# Patient Record
Sex: Female | Born: 1950 | Race: White | Hispanic: No | Smoking: Never smoker
Health system: Southern US, Community
[De-identification: ages and names within clinical notes are randomized; demographics above are authoritative.]

## PROBLEM LIST (undated history)

## (undated) DIAGNOSIS — H269 Unspecified cataract: Secondary | ICD-10-CM

## (undated) DIAGNOSIS — M1990 Unspecified osteoarthritis, unspecified site: Secondary | ICD-10-CM

## (undated) DIAGNOSIS — T7840XA Allergy, unspecified, initial encounter: Secondary | ICD-10-CM

## (undated) DIAGNOSIS — S83249A Other tear of medial meniscus, current injury, unspecified knee, initial encounter: Secondary | ICD-10-CM

## (undated) DIAGNOSIS — E039 Hypothyroidism, unspecified: Secondary | ICD-10-CM

## (undated) DIAGNOSIS — K589 Irritable bowel syndrome without diarrhea: Secondary | ICD-10-CM

## (undated) DIAGNOSIS — K635 Polyp of colon: Secondary | ICD-10-CM

## (undated) DIAGNOSIS — C50919 Malignant neoplasm of unspecified site of unspecified female breast: Secondary | ICD-10-CM

## (undated) DIAGNOSIS — G4730 Sleep apnea, unspecified: Secondary | ICD-10-CM

## (undated) DIAGNOSIS — E785 Hyperlipidemia, unspecified: Secondary | ICD-10-CM

## (undated) DIAGNOSIS — N200 Calculus of kidney: Secondary | ICD-10-CM

## (undated) DIAGNOSIS — N189 Chronic kidney disease, unspecified: Secondary | ICD-10-CM

## (undated) DIAGNOSIS — Z87442 Personal history of urinary calculi: Secondary | ICD-10-CM

## (undated) HISTORY — DX: Polyp of colon: K63.5

## (undated) HISTORY — DX: Other tear of medial meniscus, current injury, unspecified knee, initial encounter: S83.249A

## (undated) HISTORY — DX: Personal history of urinary calculi: Z87.442

## (undated) HISTORY — DX: Calculus of kidney: N20.0

## (undated) HISTORY — PX: TONSILLECTOMY: SUR1361

## (undated) HISTORY — DX: Unspecified cataract: H26.9

## (undated) HISTORY — PX: COLONOSCOPY: SHX174

## (undated) HISTORY — DX: Allergy, unspecified, initial encounter: T78.40XA

## (undated) HISTORY — DX: Irritable bowel syndrome, unspecified: K58.9

## (undated) HISTORY — DX: Irritable bowel syndrome without diarrhea: K58.9

## (undated) HISTORY — DX: Malignant neoplasm of unspecified site of unspecified female breast: C50.919

## (undated) HISTORY — DX: Unspecified osteoarthritis, unspecified site: M19.90

## (undated) HISTORY — DX: Chronic kidney disease, unspecified: N18.9

## (undated) HISTORY — DX: Hyperlipidemia, unspecified: E78.5

---

## 1975-04-23 HISTORY — PX: BREAST BIOPSY: SHX20

## 1975-04-23 HISTORY — PX: CYSTOSCOPY: SUR368

## 1988-04-22 HISTORY — PX: MYOMECTOMY: SHX85

## 1999-05-04 ENCOUNTER — Other Ambulatory Visit: Admission: RE | Admit: 1999-05-04 | Discharge: 1999-05-04 | Payer: Self-pay | Admitting: Obstetrics and Gynecology

## 2002-06-28 ENCOUNTER — Other Ambulatory Visit: Admission: RE | Admit: 2002-06-28 | Discharge: 2002-06-28 | Payer: Self-pay | Admitting: Obstetrics and Gynecology

## 2003-07-28 ENCOUNTER — Other Ambulatory Visit: Admission: RE | Admit: 2003-07-28 | Discharge: 2003-07-28 | Payer: Self-pay | Admitting: Obstetrics and Gynecology

## 2004-04-22 DIAGNOSIS — C50919 Malignant neoplasm of unspecified site of unspecified female breast: Secondary | ICD-10-CM

## 2004-04-22 HISTORY — DX: Malignant neoplasm of unspecified site of unspecified female breast: C50.919

## 2004-04-22 HISTORY — PX: BREAST LUMPECTOMY: SHX2

## 2004-07-11 ENCOUNTER — Ambulatory Visit: Payer: Self-pay | Admitting: Internal Medicine

## 2004-07-19 ENCOUNTER — Ambulatory Visit: Payer: Self-pay | Admitting: Internal Medicine

## 2004-09-10 ENCOUNTER — Other Ambulatory Visit: Admission: RE | Admit: 2004-09-10 | Discharge: 2004-09-10 | Payer: Self-pay | Admitting: Obstetrics and Gynecology

## 2004-10-13 ENCOUNTER — Encounter: Admission: RE | Admit: 2004-10-13 | Discharge: 2004-10-13 | Payer: Self-pay | Admitting: Surgery

## 2004-10-15 ENCOUNTER — Ambulatory Visit: Payer: Self-pay | Admitting: Internal Medicine

## 2004-10-22 ENCOUNTER — Ambulatory Visit: Payer: Self-pay | Admitting: Internal Medicine

## 2004-11-09 ENCOUNTER — Encounter: Admission: RE | Admit: 2004-11-09 | Discharge: 2004-11-09 | Payer: Self-pay | Admitting: Surgery

## 2004-11-15 ENCOUNTER — Encounter (INDEPENDENT_AMBULATORY_CARE_PROVIDER_SITE_OTHER): Payer: Self-pay | Admitting: *Deleted

## 2004-11-15 ENCOUNTER — Ambulatory Visit (HOSPITAL_COMMUNITY): Admission: RE | Admit: 2004-11-15 | Discharge: 2004-11-15 | Payer: Self-pay | Admitting: Surgery

## 2004-11-15 ENCOUNTER — Ambulatory Visit (HOSPITAL_BASED_OUTPATIENT_CLINIC_OR_DEPARTMENT_OTHER): Admission: RE | Admit: 2004-11-15 | Discharge: 2004-11-15 | Payer: Self-pay | Admitting: Surgery

## 2004-11-16 ENCOUNTER — Ambulatory Visit: Payer: Self-pay | Admitting: Oncology

## 2004-11-20 ENCOUNTER — Ambulatory Visit: Admission: RE | Admit: 2004-11-20 | Discharge: 2005-02-14 | Payer: Self-pay | Admitting: Radiation Oncology

## 2005-02-08 ENCOUNTER — Ambulatory Visit: Payer: Self-pay | Admitting: Oncology

## 2005-03-08 ENCOUNTER — Ambulatory Visit: Payer: Self-pay | Admitting: Internal Medicine

## 2005-05-07 ENCOUNTER — Ambulatory Visit: Payer: Self-pay | Admitting: Oncology

## 2005-05-09 ENCOUNTER — Ambulatory Visit: Payer: Self-pay | Admitting: Gastroenterology

## 2005-08-05 ENCOUNTER — Ambulatory Visit: Payer: Self-pay | Admitting: Oncology

## 2005-08-13 ENCOUNTER — Ambulatory Visit: Payer: Self-pay | Admitting: Internal Medicine

## 2005-08-20 ENCOUNTER — Ambulatory Visit: Payer: Self-pay | Admitting: Internal Medicine

## 2006-02-21 ENCOUNTER — Ambulatory Visit: Payer: Self-pay | Admitting: Internal Medicine

## 2006-02-21 LAB — CONVERTED CEMR LAB
Alkaline Phosphatase: 43 units/L (ref 39–117)
Chol/HDL Ratio, serum: 3.3
Cholesterol: 180 mg/dL (ref 0–200)
VLDL: 22 mg/dL (ref 0–40)

## 2006-04-22 HISTORY — PX: CATARACT EXTRACTION: SUR2

## 2006-07-28 ENCOUNTER — Ambulatory Visit: Payer: Self-pay | Admitting: Oncology

## 2006-07-30 LAB — COMPREHENSIVE METABOLIC PANEL
ALT: 21 U/L (ref 0–35)
AST: 20 U/L (ref 0–37)
Alkaline Phosphatase: 48 U/L (ref 39–117)
Chloride: 103 mEq/L (ref 96–112)
Creatinine, Ser: 0.75 mg/dL (ref 0.40–1.20)
Total Bilirubin: 0.4 mg/dL (ref 0.3–1.2)

## 2006-07-30 LAB — CANCER ANTIGEN 27.29: CA 27.29: 16 U/mL (ref 0–39)

## 2006-09-03 ENCOUNTER — Ambulatory Visit: Payer: Self-pay | Admitting: Internal Medicine

## 2006-09-03 LAB — CONVERTED CEMR LAB
BUN: 15 mg/dL (ref 6–23)
Basophils Absolute: 0 10*3/uL (ref 0.0–0.1)
Bilirubin, Direct: 0.1 mg/dL (ref 0.0–0.3)
CO2: 30 meq/L (ref 19–32)
Calcium: 9.1 mg/dL (ref 8.4–10.5)
Chloride: 110 meq/L (ref 96–112)
Eosinophils Absolute: 0.1 10*3/uL (ref 0.0–0.6)
HCT: 40.9 % (ref 36.0–46.0)
LDL Cholesterol: 105 mg/dL — ABNORMAL HIGH (ref 0–99)
Lymphocytes Relative: 21.4 % (ref 12.0–46.0)
MCHC: 34.2 g/dL (ref 30.0–36.0)
MCV: 88.4 fL (ref 78.0–100.0)
Neutro Abs: 3.8 10*3/uL (ref 1.4–7.7)
Neutrophils Relative %: 67.1 % (ref 43.0–77.0)
Potassium: 4.2 meq/L (ref 3.5–5.1)
RDW: 12.8 % (ref 11.5–14.6)
TSH: 3.89 microintl units/mL (ref 0.35–5.50)
Total Bilirubin: 0.6 mg/dL (ref 0.3–1.2)
Total Protein: 6.4 g/dL (ref 6.0–8.3)
VLDL: 27 mg/dL (ref 0–40)
WBC: 5.6 10*3/uL (ref 4.5–10.5)

## 2006-09-22 ENCOUNTER — Ambulatory Visit: Payer: Self-pay | Admitting: Internal Medicine

## 2006-09-22 LAB — HM MAMMOGRAPHY

## 2006-11-26 DIAGNOSIS — Z87442 Personal history of urinary calculi: Secondary | ICD-10-CM

## 2006-11-26 HISTORY — DX: Personal history of urinary calculi: Z87.442

## 2007-04-21 ENCOUNTER — Ambulatory Visit: Payer: Self-pay | Admitting: Internal Medicine

## 2007-04-21 LAB — CONVERTED CEMR LAB
Bilirubin Urine: NEGATIVE
Glucose, Urine, Semiquant: NEGATIVE
Specific Gravity, Urine: 1.01
Urobilinogen, UA: 0.2
pH: 7

## 2007-04-25 LAB — HM COLONOSCOPY

## 2007-05-19 ENCOUNTER — Ambulatory Visit: Payer: Self-pay | Admitting: Internal Medicine

## 2007-05-19 DIAGNOSIS — K589 Irritable bowel syndrome without diarrhea: Secondary | ICD-10-CM

## 2007-05-19 DIAGNOSIS — Z853 Personal history of malignant neoplasm of breast: Secondary | ICD-10-CM

## 2007-05-19 DIAGNOSIS — E785 Hyperlipidemia, unspecified: Secondary | ICD-10-CM

## 2007-05-19 HISTORY — DX: Irritable bowel syndrome, unspecified: K58.9

## 2007-07-10 ENCOUNTER — Ambulatory Visit: Payer: Self-pay | Admitting: Internal Medicine

## 2007-07-10 DIAGNOSIS — E039 Hypothyroidism, unspecified: Secondary | ICD-10-CM

## 2007-07-10 LAB — CONVERTED CEMR LAB
ALT: 19 units/L (ref 0–35)
AST: 21 units/L (ref 0–37)
Albumin: 3.5 g/dL (ref 3.5–5.2)
Alkaline Phosphatase: 46 units/L (ref 39–117)
Bilirubin, Direct: 0.2 mg/dL (ref 0.0–0.3)
Cholesterol: 179 mg/dL (ref 0–200)
HDL: 44.8 mg/dL (ref >39.0)
LDL Cholesterol: 111 mg/dL — ABNORMAL HIGH (ref 0–99)
TSH: 2.38 microintl units/mL (ref 0.35–5.50)
Total Bilirubin: 0.4 mg/dL (ref 0.3–1.2)
Total CHOL/HDL Ratio: 4
Total Protein: 5.8 g/dL — ABNORMAL LOW (ref 6.0–8.3)
Triglycerides: 115 mg/dL (ref 0–149)
VLDL: 23 mg/dL (ref 0–40)

## 2007-07-17 ENCOUNTER — Ambulatory Visit: Payer: Self-pay | Admitting: Internal Medicine

## 2007-07-17 DIAGNOSIS — M899 Disorder of bone, unspecified: Secondary | ICD-10-CM

## 2007-07-17 DIAGNOSIS — M949 Disorder of cartilage, unspecified: Secondary | ICD-10-CM

## 2007-07-28 ENCOUNTER — Encounter: Payer: Self-pay | Admitting: Internal Medicine

## 2007-07-28 ENCOUNTER — Ambulatory Visit: Payer: Self-pay | Admitting: Oncology

## 2007-07-28 ENCOUNTER — Ambulatory Visit: Payer: Self-pay | Admitting: Internal Medicine

## 2007-07-30 LAB — COMPREHENSIVE METABOLIC PANEL
ALT: 20 U/L (ref 0–35)
AST: 21 U/L (ref 0–37)
Albumin: 4.4 g/dL (ref 3.5–5.2)
Alkaline Phosphatase: 58 U/L (ref 39–117)
BUN: 15 mg/dL (ref 6–23)
CO2: 24 mEq/L (ref 19–32)
Calcium: 9.8 mg/dL (ref 8.4–10.5)
Chloride: 105 mEq/L (ref 96–112)
Creatinine, Ser: 0.83 mg/dL (ref 0.40–1.20)
Glucose, Bld: 122 mg/dL — ABNORMAL HIGH (ref 70–99)
Potassium: 4.2 mEq/L (ref 3.5–5.3)
Sodium: 141 mEq/L (ref 135–145)
Total Bilirubin: 0.2 mg/dL — ABNORMAL LOW (ref 0.3–1.2)
Total Protein: 6.8 g/dL (ref 6.0–8.3)

## 2007-07-30 LAB — CBC WITH DIFFERENTIAL/PLATELET
BASO%: 0.3 % (ref 0.0–2.0)
Basophils Absolute: 0 10*3/uL (ref 0.0–0.1)
HCT: 39.1 % (ref 34.8–46.6)
HGB: 13.4 g/dL (ref 11.6–15.9)
MCHC: 34.3 g/dL (ref 32.0–36.0)
MONO#: 0.5 10*3/uL (ref 0.1–0.9)
NEUT%: 61.3 % (ref 39.6–76.8)
WBC: 6 10*3/uL (ref 3.9–10.0)
lymph#: 1.7 10*3/uL (ref 0.9–3.3)

## 2007-07-30 LAB — CANCER ANTIGEN 27.29: CA 27.29: 20 U/mL (ref 0–39)

## 2007-08-06 ENCOUNTER — Encounter: Payer: Self-pay | Admitting: Internal Medicine

## 2007-09-11 ENCOUNTER — Ambulatory Visit: Payer: Self-pay | Admitting: Internal Medicine

## 2007-10-05 ENCOUNTER — Telehealth: Payer: Self-pay | Admitting: Internal Medicine

## 2007-10-09 ENCOUNTER — Encounter: Payer: Self-pay | Admitting: Internal Medicine

## 2007-11-13 ENCOUNTER — Ambulatory Visit: Payer: Self-pay | Admitting: Internal Medicine

## 2007-11-13 LAB — CONVERTED CEMR LAB
LDL Cholesterol: 102 mg/dL — ABNORMAL HIGH (ref 0–99)
Total CHOL/HDL Ratio: 3.7
Vit D, 1,25-Dihydroxy: 29 — ABNORMAL LOW (ref 30–89)

## 2007-11-20 ENCOUNTER — Ambulatory Visit: Payer: Self-pay | Admitting: Internal Medicine

## 2007-12-14 ENCOUNTER — Ambulatory Visit (HOSPITAL_COMMUNITY): Admission: RE | Admit: 2007-12-14 | Discharge: 2007-12-14 | Payer: Self-pay | Admitting: Internal Medicine

## 2007-12-14 ENCOUNTER — Encounter (INDEPENDENT_AMBULATORY_CARE_PROVIDER_SITE_OTHER): Payer: Self-pay | Admitting: Obstetrics and Gynecology

## 2008-01-18 ENCOUNTER — Ambulatory Visit: Payer: Self-pay | Admitting: Internal Medicine

## 2008-02-12 ENCOUNTER — Ambulatory Visit: Payer: Self-pay | Admitting: Internal Medicine

## 2008-02-12 LAB — CONVERTED CEMR LAB: Vit D, 1,25-Dihydroxy: 43 (ref 30–89)

## 2008-02-23 ENCOUNTER — Ambulatory Visit: Payer: Self-pay | Admitting: Internal Medicine

## 2008-06-16 ENCOUNTER — Ambulatory Visit: Payer: Self-pay | Admitting: Internal Medicine

## 2008-06-16 LAB — CONVERTED CEMR LAB
Free T4: 0.7 ng/dL (ref 0.6–1.6)
Vit D, 25-Hydroxy: 38 ng/mL (ref 30–89)

## 2008-06-23 ENCOUNTER — Ambulatory Visit: Payer: Self-pay | Admitting: Internal Medicine

## 2008-06-23 LAB — CONVERTED CEMR LAB
Cholesterol, target level: 200 mg/dL
HDL goal, serum: 40 mg/dL

## 2008-08-05 ENCOUNTER — Ambulatory Visit: Payer: Self-pay | Admitting: Oncology

## 2008-08-09 LAB — CBC WITH DIFFERENTIAL/PLATELET
BASO%: 0.3 % (ref 0.0–2.0)
Eosinophils Absolute: 0.1 10*3/uL (ref 0.0–0.5)
LYMPH%: 25.1 % (ref 14.0–49.7)
MCHC: 33.6 g/dL (ref 31.5–36.0)
MONO#: 0.5 10*3/uL (ref 0.1–0.9)
NEUT#: 4 10*3/uL (ref 1.5–6.5)
RBC: 4.53 10*6/uL (ref 3.70–5.45)
RDW: 14.3 % (ref 11.2–14.5)
WBC: 6.3 10*3/uL (ref 3.9–10.3)
lymph#: 1.6 10*3/uL (ref 0.9–3.3)

## 2008-08-10 LAB — COMPREHENSIVE METABOLIC PANEL
ALT: 16 U/L (ref 0–35)
Albumin: 4.1 g/dL (ref 3.5–5.2)
CO2: 24 mEq/L (ref 19–32)
Glucose, Bld: 96 mg/dL (ref 70–99)
Potassium: 4 mEq/L (ref 3.5–5.3)
Sodium: 142 mEq/L (ref 135–145)
Total Bilirubin: 0.2 mg/dL — ABNORMAL LOW (ref 0.3–1.2)
Total Protein: 6.3 g/dL (ref 6.0–8.3)

## 2008-08-10 LAB — VITAMIN D 25 HYDROXY (VIT D DEFICIENCY, FRACTURES): Vit D, 25-Hydroxy: 43 ng/mL (ref 30–89)

## 2008-08-15 ENCOUNTER — Encounter: Payer: Self-pay | Admitting: Internal Medicine

## 2008-09-22 ENCOUNTER — Ambulatory Visit: Payer: Self-pay | Admitting: Internal Medicine

## 2008-09-22 LAB — CONVERTED CEMR LAB
ALT: 22 units/L (ref 0–35)
AST: 22 units/L (ref 0–37)
Albumin: 3.6 g/dL (ref 3.5–5.2)
Alkaline Phosphatase: 45 units/L (ref 39–117)
Cholesterol: 178 mg/dL (ref 0–200)
HDL: 53.1 mg/dL (ref >39.00)
Total Protein: 6.4 g/dL (ref 6.0–8.3)
VLDL: 27.8 mg/dL (ref 0.0–40.0)

## 2008-09-29 ENCOUNTER — Ambulatory Visit: Payer: Self-pay | Admitting: Internal Medicine

## 2008-10-12 ENCOUNTER — Encounter: Payer: Self-pay | Admitting: Internal Medicine

## 2008-10-23 ENCOUNTER — Telehealth: Payer: Self-pay | Admitting: Family Medicine

## 2008-10-23 ENCOUNTER — Emergency Department (HOSPITAL_COMMUNITY): Admission: EM | Admit: 2008-10-23 | Discharge: 2008-10-23 | Payer: Self-pay | Admitting: Emergency Medicine

## 2009-01-04 ENCOUNTER — Ambulatory Visit: Payer: Self-pay | Admitting: Internal Medicine

## 2009-01-04 LAB — CONVERTED CEMR LAB: Vit D, 25-Hydroxy: 40 ng/mL (ref 30–89)

## 2009-01-11 ENCOUNTER — Ambulatory Visit: Payer: Self-pay | Admitting: Internal Medicine

## 2009-01-11 ENCOUNTER — Telehealth (INDEPENDENT_AMBULATORY_CARE_PROVIDER_SITE_OTHER): Payer: Self-pay | Admitting: *Deleted

## 2009-01-11 DIAGNOSIS — G4733 Obstructive sleep apnea (adult) (pediatric): Secondary | ICD-10-CM

## 2009-01-16 LAB — CONVERTED CEMR LAB
CRP, High Sensitivity: 10.2 — ABNORMAL HIGH (ref 0.00–5.00)
Cortisol, Plasma: 7 ug/dL

## 2009-01-17 ENCOUNTER — Ambulatory Visit: Payer: Self-pay | Admitting: Internal Medicine

## 2009-01-26 ENCOUNTER — Encounter (INDEPENDENT_AMBULATORY_CARE_PROVIDER_SITE_OTHER): Payer: Self-pay | Admitting: *Deleted

## 2009-02-01 ENCOUNTER — Ambulatory Visit (HOSPITAL_BASED_OUTPATIENT_CLINIC_OR_DEPARTMENT_OTHER): Admission: RE | Admit: 2009-02-01 | Discharge: 2009-02-01 | Payer: Self-pay | Admitting: Internal Medicine

## 2009-02-01 ENCOUNTER — Encounter: Payer: Self-pay | Admitting: Internal Medicine

## 2009-02-14 ENCOUNTER — Ambulatory Visit: Payer: Self-pay | Admitting: Pulmonary Disease

## 2009-02-28 ENCOUNTER — Ambulatory Visit: Payer: Self-pay | Admitting: Internal Medicine

## 2009-02-28 DIAGNOSIS — M1990 Unspecified osteoarthritis, unspecified site: Secondary | ICD-10-CM

## 2009-02-28 HISTORY — DX: Unspecified osteoarthritis, unspecified site: M19.90

## 2009-03-10 ENCOUNTER — Telehealth: Payer: Self-pay | Admitting: Internal Medicine

## 2009-03-13 ENCOUNTER — Encounter: Payer: Self-pay | Admitting: Internal Medicine

## 2009-04-22 HISTORY — PX: DILATION AND CURETTAGE OF UTERUS: SHX78

## 2009-05-30 ENCOUNTER — Ambulatory Visit: Payer: Self-pay | Admitting: Internal Medicine

## 2009-05-30 LAB — CONVERTED CEMR LAB: Vit D, 25-Hydroxy: 35 ng/mL (ref 30–89)

## 2009-06-06 ENCOUNTER — Ambulatory Visit: Payer: Self-pay | Admitting: Internal Medicine

## 2009-06-06 DIAGNOSIS — E559 Vitamin D deficiency, unspecified: Secondary | ICD-10-CM | POA: Insufficient documentation

## 2009-06-14 ENCOUNTER — Encounter: Payer: Self-pay | Admitting: Internal Medicine

## 2009-08-04 ENCOUNTER — Ambulatory Visit: Payer: Self-pay | Admitting: Oncology

## 2009-08-08 LAB — CBC WITH DIFFERENTIAL/PLATELET
Basophils Absolute: 0 10*3/uL (ref 0.0–0.1)
EOS%: 2.4 % (ref 0.0–7.0)
Eosinophils Absolute: 0.2 10*3/uL (ref 0.0–0.5)
HGB: 13.2 g/dL (ref 11.6–15.9)
MCV: 87.3 fL (ref 79.5–101.0)
MONO#: 0.6 10*3/uL (ref 0.1–0.9)
NEUT#: 4 10*3/uL (ref 1.5–6.5)
NEUT%: 59.3 % (ref 38.4–76.8)
RBC: 4.49 10*6/uL (ref 3.70–5.45)
RDW: 13.7 % (ref 11.2–14.5)
lymph#: 2 10*3/uL (ref 0.9–3.3)

## 2009-08-09 LAB — COMPREHENSIVE METABOLIC PANEL
ALT: 15 U/L (ref 0–35)
AST: 17 U/L (ref 0–37)
Alkaline Phosphatase: 57 U/L (ref 39–117)
CO2: 24 mEq/L (ref 19–32)
Creatinine, Ser: 0.68 mg/dL (ref 0.40–1.20)
Glucose, Bld: 92 mg/dL (ref 70–99)
Potassium: 4.1 mEq/L (ref 3.5–5.3)
Sodium: 139 mEq/L (ref 135–145)
Total Protein: 6.1 g/dL (ref 6.0–8.3)

## 2009-08-10 ENCOUNTER — Ambulatory Visit: Payer: Self-pay | Admitting: Internal Medicine

## 2009-08-10 LAB — CONVERTED CEMR LAB
TSH: 3.23 microintl units/mL (ref 0.35–5.50)
Vit D, 25-Hydroxy: 43 ng/mL (ref 30–89)

## 2009-08-15 ENCOUNTER — Encounter: Payer: Self-pay | Admitting: Internal Medicine

## 2009-08-17 ENCOUNTER — Ambulatory Visit: Payer: Self-pay | Admitting: Internal Medicine

## 2009-09-25 ENCOUNTER — Encounter: Payer: Self-pay | Admitting: Internal Medicine

## 2009-10-12 ENCOUNTER — Ambulatory Visit: Payer: Self-pay | Admitting: Internal Medicine

## 2009-10-12 LAB — CONVERTED CEMR LAB: TSH: 1.06 microintl units/mL (ref 0.35–5.50)

## 2009-10-17 ENCOUNTER — Encounter: Payer: Self-pay | Admitting: Internal Medicine

## 2009-10-19 ENCOUNTER — Ambulatory Visit: Payer: Self-pay | Admitting: Internal Medicine

## 2009-11-01 ENCOUNTER — Encounter: Payer: Self-pay | Admitting: Internal Medicine

## 2009-12-26 ENCOUNTER — Encounter: Payer: Self-pay | Admitting: Internal Medicine

## 2009-12-26 ENCOUNTER — Ambulatory Visit: Payer: Self-pay | Admitting: Internal Medicine

## 2010-04-20 ENCOUNTER — Ambulatory Visit: Payer: Self-pay | Admitting: Internal Medicine

## 2010-05-22 NOTE — Assessment & Plan Note (Signed)
Summary: fup//ccm/pt rsc/cjr   Vital Signs:  Patient profile:   60 year old female Height:      66 inches Weight:      202 pounds BMI:     32.72 Temp:     98.5 degrees F oral Pulse rate:   76 / minute Resp:     14 per minute BP sitting:   136 / 80  (left arm)  Vitals Entered By: Willy Eddy, LPN (August 17, 2009 10:29 AM) CC: roa labs   CC:  roa labs.  History of Present Illness: the pt has lost 2 pounds still has fatigue but has been wearing mask and hjas felt better ( this was a process) she has a hx of weight gain and fatigue  Preventive Screening-Counseling & Management  Alcohol-Tobacco     Smoking Status: never     Passive Smoke Exposure: no  Problems Prior to Update: 1)  Unspecified Vitamin D Deficiency  (ICD-268.9) 2)  Osteoarthros Unspec Whether Gen/loc Unspec Site  (ICD-715.90) 3)  Sleep Apnea, Obstructive  (ICD-327.23) 4)  Osteopenia  (ICD-733.90) 5)  Neck Pain, Left  (ICD-723.1) 6)  Hypothyroidism  (ICD-244.9) 7)  Abnormal Weight Gain  (ICD-783.1) 8)  Breast Cancer, Hx of  (ICD-V10.3) 9)  Irritable Bowel Syndrome  (ICD-564.1) 10)  Hyperlipidemia  (ICD-272.4) 11)  Acute Cystitis  (ICD-595.0) 12)  Nephrolithiasis, Hx of  (ICD-V13.01) 13)  Family History Diabetes 1st Degree Relative  (ICD-V18.0)  Current Problems (verified): 1)  Unspecified Vitamin D Deficiency  (ICD-268.9) 2)  Osteoarthros Unspec Whether Gen/loc Unspec Site  (ICD-715.90) 3)  Sleep Apnea, Obstructive  (ICD-327.23) 4)  Osteopenia  (ICD-733.90) 5)  Neck Pain, Left  (ICD-723.1) 6)  Hypothyroidism  (ICD-244.9) 7)  Abnormal Weight Gain  (ICD-783.1) 8)  Breast Cancer, Hx of  (ICD-V10.3) 9)  Irritable Bowel Syndrome  (ICD-564.1) 10)  Hyperlipidemia  (ICD-272.4) 11)  Acute Cystitis  (ICD-595.0) 12)  Nephrolithiasis, Hx of  (ICD-V13.01) 13)  Family History Diabetes 1st Degree Relative  (ICD-V18.0)  Medications Prior to Update: 1)  Tamoxifen Citrate 20 Mg  Tabs (Tamoxifen Citrate)  .... Take 1 Tablet By Mouth Once A Day 2)  Synthroid 25 Mcg Tabs (Levothyroxine Sodium) .... One By Mouth Daily 3)  Vitamin D (Ergocalciferol) 50000 Unit Caps (Ergocalciferol) .... One By Mouth Weekly 4)  Caltrate 600+d 600-400 Mg-Unit Tabs (Calcium Carbonate-Vitamin D) .... One By Mouth in Pm 5)  Zolpidem Tartrate 5 Mg Tabs (Zolpidem Tartrate) .... One By Mouth Q Hs Prn  Current Medications (verified): 1)  Tamoxifen Citrate 20 Mg  Tabs (Tamoxifen Citrate) .... Take 1 Tablet By Mouth Once A Day 2)  Armour Thyroid 60 Mg Tabs (Thyroid) .... One By Mouth Daily 3)  Vitamin D (Ergocalciferol) 50000 Unit Caps (Ergocalciferol) .... One By Mouth Weekly 4)  Caltrate 600+d 600-400 Mg-Unit Tabs (Calcium Carbonate-Vitamin D) .... One By Mouth in Pm 5)  Zolpidem Tartrate 5 Mg Tabs (Zolpidem Tartrate) .... One By Mouth Q Hs Prn  Allergies (verified): 1)  ! Relafen  Past History:  Family History: Last updated: 11/26/2006 Family History Diabetes 1st degree relative Family History Hypertension Family History Uterine cancer  Social History: Last updated: 11/26/2006 Married Never Smoked  Risk Factors: Smoking Status: never (08/17/2009) Passive Smoke Exposure: no (08/17/2009)  Past medical, surgical, family and social histories (including risk factors) reviewed, and no changes noted (except as noted below).  Past Medical History: Reviewed history from 05/19/2007 and no changes required. Nephrolithiasis, hx of IBS Hyperlipidemia Breast  cancer, hx of tx with radiation, surgery and adjuvant therapy with tamoxifen ( year two)  Past Surgical History: Reviewed history from 05/19/2007 and no changes required. Tonsillectomy Lumpectomy-1977 Myomectomy-1990 Kidney Stones-1977 Colonoscopy-02/22/2002 breast cancer 2006 lumpectocy, radiation 33 tx Cataract extraction  Family History: Reviewed history from 11/26/2006 and no changes required. Family History Diabetes 1st degree relative Family  History Hypertension Family History Uterine cancer  Social History: Reviewed history from 11/26/2006 and no changes required. Married Never Smoked  Review of Systems  The patient denies anorexia, fever, weight loss, weight gain, vision loss, decreased hearing, hoarseness, chest pain, syncope, dyspnea on exertion, peripheral edema, prolonged cough, headaches, hemoptysis, abdominal pain, melena, hematochezia, severe indigestion/heartburn, hematuria, incontinence, genital sores, muscle weakness, suspicious skin lesions, transient blindness, difficulty walking, depression, unusual weight change, abnormal bleeding, enlarged lymph nodes, angioedema, and breast masses.    Physical Exam  Head:  normocephalic and no abnormalities observed.   Eyes:  pupils equal and pupils round.   Ears:  R ear normal and L ear normal.   Nose:  no external deformity and no nasal discharge.   Mouth:  pharynx pink and moist.   Neck:  No deformities, masses, or tenderness noted. pickwickean neck Lungs:  normal respiratory effort and no wheezes.   Heart:  normal rate and regular rhythm.   Abdomen:  soft, non-tender, and distended.   Msk:  No deformity or scoliosis noted of thoracic or lumbar spine.   Extremities:  No clubbing, cyanosis, edema, or deformity noted with normal full range of motion of all joints.   Neurologic:  alert & oriented X3 and gait normal.     Impression & Recommendations:  Problem # 1:  SLEEP APNEA, OBSTRUCTIVE (ICD-327.23) continue mask  Problem # 2:  HYPOTHYROIDISM (ICD-244.9) the pt will be switch forn sythoir and begin armour thyriod Her updated medication list for this problem includes:    Armour Thyroid 60 Mg Tabs (Thyroid) ..... One by mouth daily  Labs Reviewed: TSH: 3.23 (08/10/2009)    Chol: 178 (09/22/2008)   HDL: 53.10 (09/22/2008)   LDL: 97 (09/22/2008)   TG: 139.0 (09/22/2008)  Problem # 3:  ABNORMAL WEIGHT GAIN (ICD-783.1) eating and exercize  Problem # 4:   OSTEOARTHROS UNSPEC WHETHER GEN/LOC UNSPEC SITE (ICD-715.90)  increased joint pain.... is this from the tamoxifen awakens from meds   Discussed use of medications, application of heat or cold, and exercises.   Complete Medication List: 1)  Tamoxifen Citrate 20 Mg Tabs (Tamoxifen citrate) .... Take 1 tablet by mouth once a day 2)  Armour Thyroid 60 Mg Tabs (Thyroid) .... One by mouth daily 3)  Vitamin D (ergocalciferol) 50000 Unit Caps (Ergocalciferol) .... One by mouth weekly 4)  Caltrate 600+d 600-400 Mg-unit Tabs (Calcium carbonate-vitamin d) .... One by mouth in pm 5)  Zolpidem Tartrate 5 Mg Tabs (Zolpidem tartrate) .... One by mouth q hs prn  Patient Instructions: 1)  Please schedule a follow-up appointment in 2 months. 2)  TSH prior to visit, ICD-9:244.8 3)  t3 free and t4 free  244.8 Prescriptions: ARMOUR THYROID 60 MG TABS (THYROID) one by mouth daily  #30 x 11   Entered and Authorized by:   Stacie Glaze MD   Signed by:   Stacie Glaze MD on 08/17/2009   Method used:   Electronically to        [facility]* (retail)       [address]       [place], [place]  [id number]       Ph: [phone]       Fax: [phone]   RxID:   [id number]

## 2010-05-22 NOTE — Letter (Signed)
Summary: Medical Necessity for CPAP Supplies/[facility]  Medical Necessity for CPAP Supplies/[facility]   Imported By: Maryln Gottron 09/27/2009 15:45:46  _____________________________________________________________________  External Attachment:    Type:   Image     Comment:   External Document

## 2010-05-22 NOTE — Letter (Signed)
[facility]   Imported By: Maryln Gottron 09/04/2009 12:19:55  _____________________________________________________________________  External Attachment:    Type:   Image     Comment:   External Document

## 2010-05-22 NOTE — Op Note (Signed)
Summary: Breast Biopsy and Mammogram Post Procedure  Breast Biopsy and Mammogram Post Procedure   Imported By: Maryln Gottron 11/08/2009 12:38:24  _____________________________________________________________________  External Attachment:    Type:   Image     Comment:   External Document

## 2010-05-22 NOTE — Assessment & Plan Note (Signed)
Summary: 3 month rov/njr/PT RESCD//CCM   Vital Signs:  Patient profile:   60 year old female Height:      66 inches Weight:      204 pounds BMI:     33.05 Temp:     98.3 degrees F oral Pulse rate:   72 / minute Resp:     14 per minute BP sitting:   140 / 80  (left arm)  Vitals Entered By: Willy Eddy, LPN (June 06, 2009 9:27 AM) CC: roa labs   CC:  roa labs.  History of Present Illness: follow up sleep apnea follow up home auto titrations result discussion of vit d and the rol in OP and BC I have spent greater that 30 min face to face evaluating this patient husband present in councilling  Preventive Screening-Counseling & Management  Alcohol-Tobacco     Smoking Status: never     Passive Smoke Exposure: no  Problems Prior to Update: 1)  Osteoarthros Unspec Whether Gen/loc Unspec Site  (ICD-715.90) 2)  Sleep Apnea, Obstructive  (ICD-327.23) 3)  Osteopenia  (ICD-733.90) 4)  Neck Pain, Left  (ICD-723.1) 5)  Hypothyroidism  (ICD-244.9) 6)  Abnormal Weight Gain  (ICD-783.1) 7)  Breast Cancer, Hx of  (ICD-V10.3) 8)  Irritable Bowel Syndrome  (ICD-564.1) 9)  Hyperlipidemia  (ICD-272.4) 10)  Acute Cystitis  (ICD-595.0) 11)  Nephrolithiasis, Hx of  (ICD-V13.01) 12)  Family History Diabetes 1st Degree Relative  (ICD-V18.0)  Current Problems (verified): 1)  Osteoarthros Unspec Whether Gen/loc Unspec Site  (ICD-715.90) 2)  Sleep Apnea, Obstructive  (ICD-327.23) 3)  Osteopenia  (ICD-733.90) 4)  Neck Pain, Left  (ICD-723.1) 5)  Hypothyroidism  (ICD-244.9) 6)  Abnormal Weight Gain  (ICD-783.1) 7)  Breast Cancer, Hx of  (ICD-V10.3) 8)  Irritable Bowel Syndrome  (ICD-564.1) 9)  Hyperlipidemia  (ICD-272.4) 10)  Acute Cystitis  (ICD-595.0) 11)  Nephrolithiasis, Hx of  (ICD-V13.01) 12)  Family History Diabetes 1st Degree Relative  (ICD-V18.0)  Medications Prior to Update: 1)  Tamoxifen Citrate 20 Mg  Tabs (Tamoxifen Citrate) .... Take 1 Tablet By Mouth Once A  Day 2)  Ergocalciferol 50000 Unit Caps (Ergocalciferol) .... One By Mouth Weekly 3)  Synthroid 25 Mcg Tabs (Levothyroxine Sodium) .... One By Mouth Daily 4)  Vitamin D (Ergocalciferol) 50000 Unit Caps (Ergocalciferol) .... One By Mouth Weekly  Current Medications (verified): 1)  Tamoxifen Citrate 20 Mg  Tabs (Tamoxifen Citrate) .... Take 1 Tablet By Mouth Once A Day 2)  Synthroid 25 Mcg Tabs (Levothyroxine Sodium) .... One By Mouth Daily 3)  Vitamin D (Ergocalciferol) 50000 Unit Caps (Ergocalciferol) .... One By Mouth Weekly 4)  Caltrate 600+d 600-400 Mg-Unit Tabs (Calcium Carbonate-Vitamin D) .... One By Mouth in Pm 5)  Zolpidem Tartrate 5 Mg Tabs (Zolpidem Tartrate) .... One By Mouth Q Hs Prn  Allergies (verified): 1)  ! Relafen  Past History:  Family History: Last updated: 11/26/2006 Family History Diabetes 1st degree relative Family History Hypertension Family History Uterine cancer  Social History: Last updated: 11/26/2006 Married Never Smoked  Risk Factors: Smoking Status: never (06/06/2009) Passive Smoke Exposure: no (06/06/2009)  Past medical, surgical, family and social histories (including risk factors) reviewed, and no changes noted (except as noted below).  Past Medical History: Reviewed history from 05/19/2007 and no changes required. Nephrolithiasis, hx of IBS Hyperlipidemia Breast cancer, hx of tx with radiation, surgery and adjuvant therapy with tamoxifen ( year two)  Past Surgical History: Reviewed history from 05/19/2007 and no changes required. Tonsillectomy  Lumpectomy-1977 Myomectomy-1990 Kidney Stones-1977 Colonoscopy-02/22/2002 breast cancer 2006 lumpectocy, radiation 33 tx Cataract extraction  Family History: Reviewed history from 11/26/2006 and no changes required. Family History Diabetes 1st degree relative Family History Hypertension Family History Uterine cancer  Social History: Reviewed history from 11/26/2006 and no changes  required. Married Never Smoked  Review of Systems  The patient denies anorexia, fever, weight loss, weight gain, vision loss, decreased hearing, hoarseness, chest pain, syncope, dyspnea on exertion, peripheral edema, prolonged cough, headaches, hemoptysis, abdominal pain, melena, hematochezia, severe indigestion/heartburn, hematuria, incontinence, genital sores, muscle weakness, suspicious skin lesions, transient blindness, difficulty walking, depression, unusual weight change, abnormal bleeding, enlarged lymph nodes, angioedema, and breast masses.    Physical Exam  General:  well-developed and overweight-appearing.   Head:  normocephalic and no abnormalities observed.   Eyes:  pupils equal and pupils round.   Ears:  R ear normal and L ear normal.   Nose:  no external deformity and no nasal discharge.   Mouth:  pharynx pink and moist.   Neck:  No deformities, masses, or tenderness noted. pickwickean neck Lungs:  normal respiratory effort and no wheezes.   Heart:  normal rate and regular rhythm.   Abdomen:  soft, non-tender, and distended.   Msk:  No deformity or scoliosis noted of thoracic or lumbar spine.   Extremities:  No clubbing, cyanosis, edema, or deformity noted with normal full range of motion of all joints.   Neurologic:  alert & oriented X3 and gait normal.     Impression & Recommendations:  Problem # 1:  UNSPECIFIED VITAMIN D DEFICIENCY (ICD-268.9) add back the PM dose of calcium 600mg  caltrate /400 iu  Problem # 2:  SLEEP APNEA, OBSTRUCTIVE (ICD-327.23) refer to auto titration rx for ambien fto get used to the mask  Complete Medication List: 1)  Tamoxifen Citrate 20 Mg Tabs (Tamoxifen citrate) .... Take 1 tablet by mouth once a day 2)  Synthroid 25 Mcg Tabs (Levothyroxine sodium) .... One by mouth daily 3)  Vitamin D (ergocalciferol) 50000 Unit Caps (Ergocalciferol) .... One by mouth weekly 4)  Caltrate 600+d 600-400 Mg-unit Tabs (Calcium carbonate-vitamin d) ....  One by mouth in pm 5)  Zolpidem Tartrate 5 Mg Tabs (Zolpidem tartrate) .... One by mouth q hs prn  Patient Instructions: 1)  add backthe pm dose of caltrate 600/400 plus minerals 2)  natural calcium in diet ygurt mild broccholi 3)  Please schedule a follow-up appointment in 2 months. 4)  vit d level  268.9 5)  calcium level  268.9 6)  TSH 244.8 7)  T3 T4 free Prescriptions: ZOLPIDEM TARTRATE 5 MG TABS (ZOLPIDEM TARTRATE) one by mouth q HS prn  #30 x 0   Entered and Authorized by:   Stacie Glaze MD   Signed by:   Stacie Glaze MD on 06/06/2009   Method used:   Print then Give to Patient   RxID:   [id number]

## 2010-05-22 NOTE — Miscellaneous (Signed)
Summary: BONE DENSITY  Clinical Lists Changes  Orders: Added new Test order of T-Bone Densitometry (77080) - Signed Added new Test order of T-Lumbar Vertebral Assessment (77082) - Signed 

## 2010-05-22 NOTE — Medication Information (Signed)
Summary: CPAP Supplies  CPAP Supplies   Imported By: Maryln Gottron 06/13/2009 14:12:13  _____________________________________________________________________  External Attachment:    Type:   Image     Comment:   External Document

## 2010-05-22 NOTE — Assessment & Plan Note (Signed)
Summary: 2 MONTH ROV/NJR   Vital Signs:  Patient profile:   60 year old female Height:      66 inches Weight:      212 pounds BMI:     34.34 Temp:     98.2 degrees F oral Pulse rate:   76 / minute Resp:     14 per minute BP sitting:   140 / 80  (left arm)  Vitals Entered By: Willy Eddy, LPN (October 19, 2009 9:52 AM) CC: roa labs   CC:  roa labs.  History of Present Illness: weight has increased 10 pounds has stopped exercize the pt presents for follow up of thyroid has gone gluten free and feels better ( the limits of carbs may be the two times a day factor) the pts has changes to the armour thyroid  Preventive Screening-Counseling & Management  Alcohol-Tobacco     Smoking Status: never     Passive Smoke Exposure: no  Problems Prior to Update: 1)  Unspecified Vitamin D Deficiency  (ICD-268.9) 2)  Osteoarthros Unspec Whether Gen/loc Unspec Site  (ICD-715.90) 3)  Sleep Apnea, Obstructive  (ICD-327.23) 4)  Osteopenia  (ICD-733.90) 5)  Neck Pain, Left  (ICD-723.1) 6)  Hypothyroidism  (ICD-244.9) 7)  Abnormal Weight Gain  (ICD-783.1) 8)  Breast Cancer, Hx of  (ICD-V10.3) 9)  Irritable Bowel Syndrome  (ICD-564.1) 10)  Hyperlipidemia  (ICD-272.4) 11)  Acute Cystitis  (ICD-595.0) 12)  Nephrolithiasis, Hx of  (ICD-V13.01) 13)  Family History Diabetes 1st Degree Relative  (ICD-V18.0)  Current Problems (verified): 1)  Unspecified Vitamin D Deficiency  (ICD-268.9) 2)  Osteoarthros Unspec Whether Gen/loc Unspec Site  (ICD-715.90) 3)  Sleep Apnea, Obstructive  (ICD-327.23) 4)  Osteopenia  (ICD-733.90) 5)  Neck Pain, Left  (ICD-723.1) 6)  Hypothyroidism  (ICD-244.9) 7)  Abnormal Weight Gain  (ICD-783.1) 8)  Breast Cancer, Hx of  (ICD-V10.3) 9)  Irritable Bowel Syndrome  (ICD-564.1) 10)  Hyperlipidemia  (ICD-272.4) 11)  Acute Cystitis  (ICD-595.0) 12)  Nephrolithiasis, Hx of  (ICD-V13.01) 13)  Family History Diabetes 1st Degree Relative  (ICD-V18.0)  Medications  Prior to Update: 1)  Tamoxifen Citrate 20 Mg  Tabs (Tamoxifen Citrate) .... Take 1 Tablet By Mouth Once A Day 2)  Armour Thyroid 60 Mg Tabs (Thyroid) .... One By Mouth Daily 3)  Vitamin D (Ergocalciferol) 50000 Unit Caps (Ergocalciferol) .... One By Mouth Weekly 4)  Caltrate 600+d 600-400 Mg-Unit Tabs (Calcium Carbonate-Vitamin D) .... One By Mouth in Pm 5)  Zolpidem Tartrate 5 Mg Tabs (Zolpidem Tartrate) .... One By Mouth Q Hs Prn  Current Medications (verified): 1)  Tamoxifen Citrate 20 Mg  Tabs (Tamoxifen Citrate) .... Take 1 Tablet By Mouth Once A Day 2)  Armour Thyroid 60 Mg Tabs (Thyroid) .... One By Mouth Daily 3)  Vitamin D (Ergocalciferol) 50000 Unit Caps (Ergocalciferol) .... One By Mouth Weekly 4)  Caltrate 600+d 600-400 Mg-Unit Tabs (Calcium Carbonate-Vitamin D) .... One By Mouth in Pm  Allergies (verified): 1)  ! Relafen  Past History:  Family History: Last updated: 11/26/2006 Family History Diabetes 1st degree relative Family History Hypertension Family History Uterine cancer  Social History: Last updated: 11/26/2006 Married Never Smoked  Risk Factors: Smoking Status: never (10/19/2009) Passive Smoke Exposure: no (10/19/2009)  Past medical, surgical, family and social histories (including risk factors) reviewed, and no changes noted (except as noted below).  Past Medical History: Reviewed history from 05/19/2007 and no changes required. Nephrolithiasis, hx of IBS Hyperlipidemia Breast cancer, hx of  tx with radiation, surgery and adjuvant therapy with tamoxifen ( year two)  Past Surgical History: Reviewed history from 05/19/2007 and no changes required. Tonsillectomy Lumpectomy-1977 Myomectomy-1990 Kidney Stones-1977 Colonoscopy-02/22/2002 breast cancer 2006 lumpectocy, radiation 33 tx Cataract extraction  Family History: Reviewed history from 11/26/2006 and no changes required. Family History Diabetes 1st degree relative Family History  Hypertension Family History Uterine cancer  Social History: Reviewed history from 11/26/2006 and no changes required. Married Never Smoked  Review of Systems  The patient denies anorexia, fever, weight loss, weight gain, vision loss, decreased hearing, hoarseness, chest pain, syncope, dyspnea on exertion, peripheral edema, prolonged cough, headaches, hemoptysis, abdominal pain, melena, hematochezia, severe indigestion/heartburn, hematuria, incontinence, genital sores, muscle weakness, suspicious skin lesions, transient blindness, difficulty walking, depression, unusual weight change, abnormal bleeding, enlarged lymph nodes, angioedema, and breast masses.    Physical Exam  General:  well-developed and overweight-appearing.   Head:  normocephalic and no abnormalities observed.   Eyes:  pupils equal and pupils round.   Ears:  R ear normal and L ear normal.   Nose:  no external deformity and no nasal discharge.   Mouth:  pharynx pink and moist.   Neck:  No deformities, masses, or tenderness noted. pickwickean neck Lungs:  normal respiratory effort and no wheezes.   Heart:  normal rate and regular rhythm.   Abdomen:  soft, non-tender, and distended.     Impression & Recommendations:  Problem # 1:  SLEEP APNEA, OBSTRUCTIVE (ICD-327.23) the pt has improved form 50% to change to 100 of usage  Problem # 2:  HYPERLIPIDEMIA (ICD-272.4) Assessment: Unchanged  Labs Reviewed: SGOT: 22 (09/22/2008)   SGPT: 22 (09/22/2008)  Lipid Goals: Chol Goal: 200 (06/23/2008)   HDL Goal: 40 (06/23/2008)   LDL Goal: 160 (06/23/2008)   TG Goal: 150 (06/23/2008)  Prior 10 Yr Risk Heart Disease: 11 % (06/23/2008)   HDL:53.10 (09/22/2008), 46.5 (11/13/2007)  LDL:97 (09/22/2008), 102 (16/01/9603)  Chol:178 (09/22/2008), 173 (11/13/2007)  Trig:139.0 (09/22/2008), 121 (11/13/2007)  Orders: Nutrition Referral (Nutrition)  Problem # 3:  HYPOTHYROIDISM (ICD-244.9) stable Her updated medication list for this  problem includes:    Armour Thyroid 60 Mg Tabs (Thyroid) ..... One by mouth daily  Labs Reviewed: TSH: 3.23 (08/10/2009)    Chol: 178 (09/22/2008)   HDL: 53.10 (09/22/2008)   LDL: 97 (09/22/2008)   TG: 139.0 (09/22/2008)  Problem # 4:  NECK PAIN, LEFT (ICD-723.1)  Discussed exercises and use of moist heat or cold and medication.   Problem # 5:  BREAST CANCER, HX OF (ICD-V10.3) has a bx scheduled  Complete Medication List: 1)  Tamoxifen Citrate 20 Mg Tabs (Tamoxifen citrate) .... Take 1 tablet by mouth once a day 2)  Armour Thyroid 60 Mg Tabs (Thyroid) .... One by mouth daily 3)  Vitamin D (ergocalciferol) 50000 Unit Caps (Ergocalciferol) .... One by mouth weekly 4)  Caltrate 600+d 600-400 Mg-unit Tabs (Calcium carbonate-vitamin d) .... One by mouth in pm  Patient Instructions: 1)  It is important that you exercise regularly at least 30  minutes 5 times a week. If you develop chest pain, have severe difficulty breathing, or feel very tired , stop exercising immediately and seek medical attention. 2)  Please schedule a follow-up appointment in 4 months.  CPX

## 2010-07-05 ENCOUNTER — Other Ambulatory Visit: Payer: Managed Care, Other (non HMO) | Admitting: Internal Medicine

## 2010-07-05 DIAGNOSIS — M810 Age-related osteoporosis without current pathological fracture: Secondary | ICD-10-CM

## 2010-07-05 DIAGNOSIS — Z0000 Encounter for general adult medical examination without abnormal findings: Secondary | ICD-10-CM

## 2010-07-05 LAB — HEPATIC FUNCTION PANEL
ALT: 33 U/L (ref 0–35)
Alkaline Phosphatase: 54 U/L (ref 39–117)
Bilirubin, Direct: 0.1 mg/dL (ref 0.0–0.3)
Total Bilirubin: 0.4 mg/dL (ref 0.3–1.2)
Total Protein: 6.3 g/dL (ref 6.0–8.3)

## 2010-07-05 LAB — POCT URINALYSIS DIPSTICK
Glucose, UA: NEGATIVE
Nitrite, UA: NEGATIVE
Urobilinogen, UA: 0.2

## 2010-07-05 LAB — CBC WITH DIFFERENTIAL/PLATELET
Basophils Absolute: 0 10*3/uL (ref 0.0–0.1)
Basophils Relative: 0.3 % (ref 0.0–3.0)
Eosinophils Absolute: 0.1 10*3/uL (ref 0.0–0.7)
MCHC: 34 g/dL (ref 30.0–36.0)
Monocytes Absolute: 0.5 10*3/uL (ref 0.1–1.0)
Neutrophils Relative %: 61.8 % (ref 43.0–77.0)
RBC: 4.62 Mil/uL (ref 3.87–5.11)
RDW: 14 % (ref 11.5–14.6)
WBC: 6.7 10*3/uL (ref 4.5–10.5)

## 2010-07-05 LAB — BASIC METABOLIC PANEL
GFR: 91.13 mL/min (ref >60.00)
Glucose, Bld: 97 mg/dL (ref 70–99)

## 2010-07-05 LAB — LIPID PANEL: Cholesterol: 170 mg/dL (ref 0–200)

## 2010-07-05 LAB — TSH: TSH: 1.68 u[IU]/mL (ref 0.35–5.50)

## 2010-07-13 ENCOUNTER — Encounter: Payer: Self-pay | Admitting: Internal Medicine

## 2010-07-16 ENCOUNTER — Encounter: Payer: Self-pay | Admitting: Internal Medicine

## 2010-07-16 ENCOUNTER — Ambulatory Visit (INDEPENDENT_AMBULATORY_CARE_PROVIDER_SITE_OTHER): Payer: Managed Care, Other (non HMO) | Admitting: Internal Medicine

## 2010-07-16 VITALS — BP 140/84 | HR 76 | Temp 98.4°F | Resp 14 | Ht 66.0 in | Wt 216.0 lb

## 2010-07-16 DIAGNOSIS — E785 Hyperlipidemia, unspecified: Secondary | ICD-10-CM

## 2010-07-16 DIAGNOSIS — Z0000 Encounter for general adult medical examination without abnormal findings: Secondary | ICD-10-CM

## 2010-07-16 DIAGNOSIS — E039 Hypothyroidism, unspecified: Secondary | ICD-10-CM

## 2010-07-16 DIAGNOSIS — E559 Vitamin D deficiency, unspecified: Secondary | ICD-10-CM

## 2010-07-16 DIAGNOSIS — R635 Abnormal weight gain: Secondary | ICD-10-CM

## 2010-07-16 MED ORDER — THYROID 60 MG PO TABS: 60.0000 mg | ORAL_TABLET | Freq: Every day | ORAL | Status: DC

## 2010-07-16 MED ORDER — ERGOCALCIFEROL 1.25 MG (50000 UT) PO CAPS: 50000.0000 [IU] | ORAL_CAPSULE | ORAL | Status: DC

## 2010-07-16 NOTE — Assessment & Plan Note (Signed)
Referral again for nutritional counseling

## 2010-07-16 NOTE — Assessment & Plan Note (Signed)
Lipids are at goal but we have virtually lost to improve her overall risk for cardiovascular disease

## 2010-07-16 NOTE — Assessment & Plan Note (Signed)
Continue the 50,000 international units of vitamin D 2 tablets a history of osteoporosis and breast cancer

## 2010-07-16 NOTE — Progress Notes (Signed)
Subjective:    Patient ID: Jacqueline Jennings, female    DOB: December 12, 1950, 60 y.o.   MRN: [id number]  HPI patient is a moderately obese 60 year old white female who presents for complete physical examination she has also comorbid problems of hypothyroidism vitamin D deficiency with osteoporosis risks hyperlipidemia treated sleep apnea and irritable bowel syndrome.  She states that she believes that she may have a mild lactose and gluten intolerance and as part of her diet with some improvement in her bowel she has not followed the diet and exercise program and there is actually weight gain noted.   we reviewed her blood work with her she is at goal for most everything other than her weight    Review of Systems  Constitutional: Negative for activity change, appetite change and fatigue.  HENT: Negative for ear pain, congestion, neck pain, postnasal drip and sinus pressure.   Eyes: Negative for redness and visual disturbance.  Respiratory: Negative for cough, shortness of breath and wheezing.   Gastrointestinal: Negative for abdominal pain and abdominal distention.  Genitourinary: Negative for dysuria, frequency and menstrual problem.  Musculoskeletal: Negative for myalgias, joint swelling and arthralgias.  Skin: Negative for rash and wound.  Neurological: Negative for dizziness, weakness and headaches.  Hematological: Negative for adenopathy. Does not bruise/bleed easily.  Psychiatric/Behavioral: Negative for sleep disturbance and decreased concentration.       Past Medical History  Diagnosis Date  . Nephrolithiasis     hx of  . IBS (irritable bowel syndrome)   . Hyperlipidemia   . Breast cancer     hx of with rediation, surgery and adjuvant theapy with tamoxifen  (year two)   Past Surgical History  Procedure Date  . Tonsillectomy   . Breast lumpectomy 1977  . Myomectomy 1990  . Kidney stones 1977  . Breast lumpectomy 2006    raditon 33 tx  . Cataract extraction     reports  that she has never smoked. She does not have any smokeless tobacco history on file. She reports that she does not drink alcohol or use illicit drugs. family history includes Cancer in her mother; Diabetes in her mother; and Hypertension in her mother. Allergies  Allergen Reactions  . Nabumetone     REACTION: Rash    Objective:   Physical Exam  Constitutional: She is oriented to person, place, and time. She appears well-developed and well-nourished. No distress.  HENT:  Head: Normocephalic and atraumatic.  Right Ear: External ear normal.  Left Ear: External ear normal.  Nose: Nose normal.  Mouth/Throat: Oropharynx is clear and moist.  Eyes: Conjunctivae and EOM are normal. Pupils are equal, round, and reactive to light.  Neck: Normal range of motion. Neck supple. No JVD present. No tracheal deviation present. No thyromegaly present.  Cardiovascular: Normal rate, regular rhythm, normal heart sounds and intact distal pulses.   No murmur heard. Pulmonary/Chest: Effort normal and breath sounds normal. She has no wheezes. She exhibits no tenderness.  Abdominal: Soft. Bowel sounds are normal.  Musculoskeletal: Normal range of motion. She exhibits no edema and no tenderness.  Lymphadenopathy:    She has no cervical adenopathy.  Neurological: She is alert and oriented to person, place, and time. She has normal reflexes. No cranial nerve deficit.  Skin: Skin is warm and dry. She is not diaphoretic.  Psychiatric: She has a normal mood and affect. Her behavior is normal.          Assessment & Plan:   This is a  routine physical examination for this healthy  Female. Reviewed all health maintenance protocols including mammography colonoscopy bone density and reviewed appropriate screening labs. Her immunization history was reviewed as well as her current medications and allergies refills of her chronic medications were given and the plan for yearly health maintenance was discussed all orders and  referrals were made as appropriate.

## 2010-07-16 NOTE — Assessment & Plan Note (Signed)
Stable on current dose of Armour Thyroid we will attempt to send this prescription and should her insurance company refused Armour Thyroid we may have to change back to Synthroid but will discuss this with the patient

## 2010-07-29 LAB — URINALYSIS, ROUTINE W REFLEX MICROSCOPIC
Bilirubin Urine: NEGATIVE
Hgb urine dipstick: NEGATIVE
Ketones, ur: NEGATIVE mg/dL
Protein, ur: NEGATIVE mg/dL
Urobilinogen, UA: 0.2 mg/dL (ref 0.0–1.0)

## 2010-07-29 LAB — URINE CULTURE

## 2010-08-09 ENCOUNTER — Encounter (HOSPITAL_BASED_OUTPATIENT_CLINIC_OR_DEPARTMENT_OTHER): Payer: Managed Care, Other (non HMO) | Admitting: Oncology

## 2010-08-09 ENCOUNTER — Other Ambulatory Visit: Payer: Self-pay | Admitting: Oncology

## 2010-08-09 DIAGNOSIS — C50319 Malignant neoplasm of lower-inner quadrant of unspecified female breast: Secondary | ICD-10-CM

## 2010-08-09 DIAGNOSIS — Z170 Estrogen receptor positive status [ER+]: Secondary | ICD-10-CM

## 2010-08-09 LAB — CBC WITH DIFFERENTIAL/PLATELET
BASO%: 0.2 % (ref 0.0–2.0)
Basophils Absolute: 0 10*3/uL (ref 0.0–0.1)
HCT: 39.3 % (ref 34.8–46.6)
HGB: 12.7 g/dL (ref 11.6–15.9)
MCHC: 32.3 g/dL (ref 31.5–36.0)
MONO#: 0.4 10*3/uL (ref 0.1–0.9)
NEUT%: 59.3 % (ref 38.4–76.8)
WBC: 6.4 10*3/uL (ref 3.9–10.3)
lymph#: 2.1 10*3/uL (ref 0.9–3.3)

## 2010-08-09 LAB — COMPREHENSIVE METABOLIC PANEL
ALT: 22 U/L (ref 0–35)
Albumin: 4 g/dL (ref 3.5–5.2)
CO2: 24 mEq/L (ref 19–32)
Calcium: 9.2 mg/dL (ref 8.4–10.5)
Chloride: 104 mEq/L (ref 96–112)
Creatinine, Ser: 0.7 mg/dL (ref 0.40–1.20)
Total Protein: 6.1 g/dL (ref 6.0–8.3)

## 2010-08-09 LAB — CANCER ANTIGEN 27.29: CA 27.29: 12 U/mL (ref 0–39)

## 2010-08-13 ENCOUNTER — Encounter (HOSPITAL_BASED_OUTPATIENT_CLINIC_OR_DEPARTMENT_OTHER): Payer: Managed Care, Other (non HMO) | Admitting: Oncology

## 2010-08-13 DIAGNOSIS — C50319 Malignant neoplasm of lower-inner quadrant of unspecified female breast: Secondary | ICD-10-CM

## 2010-09-04 NOTE — Op Note (Signed)
Jacqueline Jennings, Jacqueline Jennings                 ACCOUNT NO.:  [id number]   MEDICAL RECORD NO.:  [id number]          PATIENT TYPE:  AMB   LOCATION:  SDC                           FACILITY:  [facility]   PHYSICIAN:  Michelle L. Grewal, M.D.DATE OF BIRTH:  09/01/1950   DATE OF PROCEDURE:  12/14/2007  DATE OF DISCHARGE:                               OPERATIVE REPORT   PREOPERATIVE DIAGNOSES:  1. Postmenopausal.  2. History of breast cancer, on tamoxifen therapy.  3. Thickened endometrial stripe.  4. Cervical stenosis.   POSTOPERATIVE DIAGNOSES:  1. Postmenopausal.  2. History of breast cancer, on tamoxifen therapy.  3. Thickened endometrial stripe.  4. Cervical stenosis.   PROCEDURE:  Dilation and curettage.   SURGEON:  Michelle L. Vincente Poli, MD   ANESTHESIA:  MAC with local.   FINDINGS:  Scant tissue.   SPECIMENS:  Endometrial curetting, sent to Pathology.   ESTIMATED BLOOD LOSS:  Minimal.   COMPLICATIONS:  None.   OPERATIVE PROCEDURE:  The patient was taken to the operating room.  After informed consent was obtained, she was prepped and draped in the  usual sterile fashion.  A speculum was placed in the vagina.  The cervix  was grasped with a tenaculum.  The cervix was deep in the vagina.  Paracervical block was performed in standard fashion.  The cervix was  noted to be completely closed, so Pratt dilators were used to dilate the  cervix after paracervical block was performed.  A sharp curette was  inserted, and a thorough uterine curettage was performed,  with minimal amount of tissue.  Polyp forceps were inserted, the  remainder of the tissue was removed.  All tissue was sent to Pathology  for analysis.  There was no bleeding noted at the end of the procedure.  All instruments were removed from the vagina.  The patient tolerated the  procedure well and went to recovery room in stable condition.      Michelle L. Vincente Poli, M.D.  Electronically Signed     MLG/MEDQ  D:  12/14/2007  T:   12/15/2007  Job:  [id number]

## 2010-09-07 NOTE — Op Note (Signed)
NAMEBRYELLA, DIVINEY                 ACCOUNT NO.:  [id number]   MEDICAL RECORD NO.:  [id number]          PATIENT TYPE:  AMB   LOCATION:  DSC                          FACILITY:  [facility]   PHYSICIAN:  Currie Paris, M.D.DATE OF BIRTH:  December 05, 1950   DATE OF PROCEDURE:  11/15/2004  DATE OF DISCHARGE:                                 OPERATIVE REPORT   OFFICE MEDICAL RECORD NUMBER:  [id number]   PREOPERATIVE DIAGNOSIS:  Cancer of right breast, lower inner quadrant.   POSTOPERATIVE DIAGNOSIS:  Cancer of right breast, lower inner quadrant.   OPERATION:  Needle-guided excision of right breast cancer with blue dye  injection and axillary sentinel lymph node biopsy (2 nodes).   SURGEON:  Currie Paris, M.D.   ANESTHESIA:  General.   CLINICAL HISTORY:  This is a 60 year old with a recently found a nonpalpable  right breast cancer in the lower inner quadrant.  After a lengthy discussion  with the patient about alternatives, she elected to proceed to needle-guided  excision with sentinel node biopsy.  She had also sought second opinions at  [facility] confirming these plans.   DESCRIPTION OF PROCEDURE:  The patient was seen in the holding area and she  had no further questions.  The right breast already had a guidewire placed  and I reviewed those films.  We confirmed that the right side was the  operative side and both patient and myself marked it.   The patient was then taken to the operating room and after satisfactory  general anesthesia had been obtained, I prepped the area around the nipple-  areolar complex. The time-out then occurred.   I injected about 5 mL of dilute methylene blue circumareolarly for the  sentinel node.  This was massaged in and we then prepped and draped.   The guidewire entered in the lower inner quadrant, tracked a little bit  superiorly and a little bit medially and then an X marked the exact location  of the tumor which had been placed by Dr.  Yolanda Bonine.   I began by making elliptical incision around the X mark.  I then raised a  little flap superiorly and went down to the chest wall superiorly, then  around medially and then inferiorly down to the chest wall for about two-  thirds of the length of the incision.  I then came under the tumor mass  right on the fascia, taking the fascia with the specimen.  With it freely  mobilized essentially on 3 side, I was able to elevate it out of the wound  and disconnected the final attachments laterally, which would be towards the  nipple.  I could not really palpate a tumor within here, as this was  supposed to be fairly small, but we looked like we well-around the  guidewire.  This was sent for specimen mammography.   While waiting, I went ahead and checked for hemostasis and then injected  some 0.25% plain Marcaine to see if we could help with postop analgesia and  placed a pack.   Attention was turned to the  right axilla.  Using a Neoprobe, I identified a  hot area.  I made a transverse incision, placed a self-retaining retractor  and divided a little bit of the subcu tissue.  As soon as I entered the  axilla, I found a blue lymphatic leading to a blue lymph node.  This had  counts about 1200 and was excised with cautery.  I did not immediately see  any additional blue, so I went ahead used a Neoprobe, identified another hot  area, which was actually a good deal higher counts than the first.  A little  further dissection revealed a second lymph node which was soft, a little bit  deeper, but directly in towards the chest wall and not up towards the  axilla.  This was excised and had counts up to 2900.  It did not have any  blue dye.   With that out, the counts drop to a background 0 to 15-20.  I palpated no  additional adenopathy, although there was one area of irregular tissue that  had been adjacent to the first node and I took this out, thinking I may have  divided through a  little bit of the lymph node in taking it out.  No blue  dye was noted either.  At this point, I went ahead and put some Marcaine in  here and then closed in layers with 3-0 Vicryl followed by 4-0 Monocryl  subcuticular.  Attention was turned back to the breast incision.  Everything  had remained dry while we were working on the axilla.  I put a couple of  sutures in deep to try to pull a little bit of the fatty tissue and base of  the breast over the exposed muscle.  I then closed the subcu was some 3-0  Vicryl and skin with a 4-0 Monocryl subcuticular, trying to leave about a  centimeter of distance so that she might potentially be a candidate for  MammoSite.  I think this cavity may be just a little too close to the skin  and chest wall with not quite enough room for a MammoSite device, but I  think this can be assessed postoperatively should be interested in  MammoSite.   Dr. Yolanda Bonine reported that the needle localization films showed on 2 views  showed the cancer to be directly in the middle of the specimen.  Dr. Laureen Ochs  reported on touch preps on the 2 lymph nodes that they were both negative.   This completed the procedure.  Dermabond was applied to each incision.  The  patient tolerated procedure well.  There were no operative complications.  All counts were correct.       CJS/MEDQ  D:  11/15/2004  T:  11/16/2004  Job:  [id number]   cc:   Stacie Glaze, M.D. [facility]

## 2010-11-05 ENCOUNTER — Telehealth: Payer: Self-pay | Admitting: Internal Medicine

## 2010-11-05 MED ORDER — THYROID 60 MG PO TABS: 60.0000 mg | ORAL_TABLET | Freq: Every day | ORAL | Status: DC

## 2010-11-05 NOTE — Telephone Encounter (Signed)
Thyroid 60 mg sent in to [facility]

## 2010-11-05 NOTE — Telephone Encounter (Signed)
Patient had to reschedule follow up and lab, only has 10 days of thyroid medication left and is requesting a refill through [employer]

## 2010-11-12 ENCOUNTER — Other Ambulatory Visit: Payer: Managed Care, Other (non HMO)

## 2010-11-14 ENCOUNTER — Encounter: Payer: Self-pay | Admitting: Internal Medicine

## 2010-11-19 ENCOUNTER — Ambulatory Visit: Payer: Managed Care, Other (non HMO) | Admitting: Internal Medicine

## 2011-01-07 ENCOUNTER — Other Ambulatory Visit (INDEPENDENT_AMBULATORY_CARE_PROVIDER_SITE_OTHER): Payer: Managed Care, Other (non HMO)

## 2011-01-07 DIAGNOSIS — E559 Vitamin D deficiency, unspecified: Secondary | ICD-10-CM

## 2011-01-08 LAB — VITAMIN D 25 HYDROXY (VIT D DEFICIENCY, FRACTURES): Vit D, 25-Hydroxy: 48 ng/mL (ref 30–89)

## 2011-01-14 ENCOUNTER — Encounter: Payer: Self-pay | Admitting: Internal Medicine

## 2011-01-14 ENCOUNTER — Ambulatory Visit (INDEPENDENT_AMBULATORY_CARE_PROVIDER_SITE_OTHER): Payer: Managed Care, Other (non HMO) | Admitting: Internal Medicine

## 2011-01-14 VITALS — BP 130/84 | HR 76 | Temp 98.0°F | Resp 16 | Ht 66.0 in | Wt 214.0 lb

## 2011-01-14 DIAGNOSIS — R5381 Other malaise: Secondary | ICD-10-CM

## 2011-01-14 DIAGNOSIS — M791 Myalgia, unspecified site: Secondary | ICD-10-CM

## 2011-01-14 DIAGNOSIS — N959 Unspecified menopausal and perimenopausal disorder: Secondary | ICD-10-CM

## 2011-01-14 DIAGNOSIS — K589 Irritable bowel syndrome without diarrhea: Secondary | ICD-10-CM

## 2011-01-14 DIAGNOSIS — IMO0001 Reserved for inherently not codable concepts without codable children: Secondary | ICD-10-CM

## 2011-01-14 DIAGNOSIS — R5383 Other fatigue: Secondary | ICD-10-CM

## 2011-01-14 DIAGNOSIS — E039 Hypothyroidism, unspecified: Secondary | ICD-10-CM

## 2011-01-14 DIAGNOSIS — R635 Abnormal weight gain: Secondary | ICD-10-CM

## 2011-01-14 DIAGNOSIS — E559 Vitamin D deficiency, unspecified: Secondary | ICD-10-CM

## 2011-01-14 DIAGNOSIS — Z23 Encounter for immunization: Secondary | ICD-10-CM

## 2011-01-14 DIAGNOSIS — E785 Hyperlipidemia, unspecified: Secondary | ICD-10-CM

## 2011-01-14 LAB — TSH: TSH: 1.33 u[IU]/mL (ref 0.35–5.50)

## 2011-01-14 MED ORDER — ERGOCALCIFEROL 1.25 MG (50000 UT) PO CAPS: 50000.0000 [IU] | ORAL_CAPSULE | ORAL | Status: DC

## 2011-01-14 MED ORDER — THYROID 90 MG PO TABS: 90.0000 mg | ORAL_TABLET | Freq: Every day | ORAL | Status: DC

## 2011-01-14 NOTE — Progress Notes (Signed)
Subjective:    Patient ID: Jacqueline Jennings, female    DOB: Feb 23, 1951, 60 y.o.   MRN: [id number]  HPI Patient has been wearing her CPAP faithfully.  But she has noticed increasing fatigue difficulty with activities of daily living increased musculoskeletal pain that she describes as cramping pain as well as deep muscle pain and joint pain.  She does have a history of hypothyroidism and vitamin D deficiency her vitamin D was monitored at 30 which is next level however her thyroid was not monitored this last time.  She does have a history of breast cancer, history of hypothyroidism a history of struggling with obesity.  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   Review of Systems  Constitutional: Negative for activity change, appetite change and fatigue.  HENT: Negative for ear pain, congestion, neck pain, postnasal drip and sinus pressure.   Eyes: Negative for redness and visual disturbance.  Respiratory: Negative for cough, shortness of breath and wheezing.   Gastrointestinal: Negative for abdominal pain and abdominal distention.  Genitourinary: Negative for dysuria, frequency and menstrual problem.  Musculoskeletal: Negative for myalgias, joint swelling and arthralgias.  Skin: Negative for rash and wound.  Neurological: Negative for dizziness, weakness and headaches.  Hematological: Negative for adenopathy. Does not bruise/bleed easily.  Psychiatric/Behavioral: Negative for sleep disturbance and decreased concentration.   Past Medical History  Diagnosis Date  . Nephrolithiasis     hx of  . IBS (irritable bowel syndrome)   . Hyperlipidemia   . Breast cancer     hx of with rediation, surgery and adjuvant theapy with tamoxifen  (year two)   Past Surgical History  Procedure Date  . Tonsillectomy   . Breast lumpectomy 1977  . Myomectomy 1990  . Kidney stones 1977  . Breast lumpectomy 2006    raditon 33 tx  . Cataract extraction     reports that  she has never smoked. She does not have any smokeless tobacco history on file. She reports that she does not drink alcohol or use illicit drugs. family history includes Cancer in her mother; Diabetes in her mother; and Hypertension in her mother. Allergies  Allergen Reactions  . Nabumetone     REACTION: Rash       Objective:   Physical Exam  Nursing note reviewed. Constitutional: She is oriented to person, place, and time. She appears well-developed and well-nourished.  Eyes: Conjunctivae are normal. Pupils are equal, round, and reactive to light.  Neck: Normal range of motion. Neck supple.  Cardiovascular: Normal rate and regular rhythm.   Pulmonary/Chest: Effort normal and breath sounds normal.  Abdominal: Soft. Bowel sounds are normal.  Musculoskeletal: Normal range of motion.  Neurological: She is alert and oriented to person, place, and time.  Skin: Skin is warm and dry.  Psychiatric: She has a normal mood and affect. Her behavior is normal.          Assessment & Plan:   certainly she is at risk for multiple etiologies for her muscle to make sure that her thyroid is appropriately adjusted we'll increase her thyroid apparently from 60-90 due to her hair loss and dry skin symptomology.  We'll monitor back in one month  Given the fact that she has had radiation and chemotherapy parathyroid disease is a possibility so  PTH and calcium will be drawn.   multiple myeloma as a possibility so a serum protein electrophoresis will be drawn   monitor her thyroid with a T3 and T4 free  todsay  If all of these possibilities do not uncover the etiology for her muscle pain and fatigue certainly depression is an equally possible diagnosis this was discussed with the patient in detail

## 2011-01-15 LAB — ESTROGENS, TOTAL: Estrogen: 134.8 pg/mL

## 2011-01-16 LAB — PROTEIN ELECTROPHORESIS, SERUM
Beta 2: 5.4 % (ref 3.2–6.5)
Beta Globulin: 6.9 % (ref 4.7–7.2)
Total Protein, Serum Electrophoresis: 7.3 g/dL (ref 6.0–8.3)

## 2011-01-17 DIAGNOSIS — H04123 Dry eye syndrome of bilateral lacrimal glands: Secondary | ICD-10-CM | POA: Insufficient documentation

## 2011-02-15 ENCOUNTER — Encounter: Payer: Self-pay | Admitting: Internal Medicine

## 2011-02-22 ENCOUNTER — Encounter: Payer: Self-pay | Admitting: Internal Medicine

## 2011-02-22 ENCOUNTER — Ambulatory Visit (INDEPENDENT_AMBULATORY_CARE_PROVIDER_SITE_OTHER): Payer: Managed Care, Other (non HMO) | Admitting: Internal Medicine

## 2011-02-22 VITALS — BP 124/80 | HR 76 | Temp 98.2°F | Resp 16 | Ht 67.2 in | Wt 214.0 lb

## 2011-02-22 DIAGNOSIS — E669 Obesity, unspecified: Secondary | ICD-10-CM

## 2011-02-22 DIAGNOSIS — E039 Hypothyroidism, unspecified: Secondary | ICD-10-CM

## 2011-02-22 MED ORDER — THYROID 120 MG PO TABS: 120.0000 mg | ORAL_TABLET | Freq: Every day | ORAL | Status: DC

## 2011-02-22 NOTE — Patient Instructions (Signed)
The patient is instructed to continue all medications as prescribed. Schedule followup with check out clerk upon leaving the clinic  

## 2011-04-18 ENCOUNTER — Other Ambulatory Visit (INDEPENDENT_AMBULATORY_CARE_PROVIDER_SITE_OTHER): Payer: Managed Care, Other (non HMO)

## 2011-04-18 ENCOUNTER — Telehealth: Payer: Self-pay

## 2011-04-18 ENCOUNTER — Telehealth: Payer: Self-pay | Admitting: *Deleted

## 2011-04-18 DIAGNOSIS — N390 Urinary tract infection, site not specified: Secondary | ICD-10-CM

## 2011-04-18 DIAGNOSIS — E039 Hypothyroidism, unspecified: Secondary | ICD-10-CM

## 2011-04-18 LAB — POCT URINALYSIS DIPSTICK
Blood, UA: NEGATIVE
Ketones, UA: NEGATIVE
Protein, UA: NEGATIVE
Spec Grav, UA: <=1.005
pH, UA: 5.5

## 2011-04-18 LAB — TSH: TSH: 0.1 u[IU]/mL — ABNORMAL LOW (ref 0.35–5.50)

## 2011-04-18 MED ORDER — CIPROFLOXACIN HCL 500 MG PO TABS: 500.0000 mg | ORAL_TABLET | Freq: Two times a day (BID) | ORAL | Status: AC

## 2011-04-18 NOTE — Telephone Encounter (Signed)
1+luekocyte- per dr Lovell Sheehan- given cipro 500 bid for 5 days

## 2011-04-18 NOTE — Telephone Encounter (Signed)
Sent to [facility] 

## 2011-04-18 NOTE — Telephone Encounter (Signed)
Pt states she has lab work at 2 pm for blood work and would like to leave a urine sample. Pls advise.

## 2011-04-18 NOTE — Telephone Encounter (Signed)
Per dr Lovell Sheehan- has 1+-luekocytes-per dr Lovell Sheehan call in cipro 500 bid for 5 days-

## 2011-04-25 ENCOUNTER — Ambulatory Visit: Payer: Managed Care, Other (non HMO) | Admitting: Internal Medicine

## 2011-04-29 ENCOUNTER — Ambulatory Visit (INDEPENDENT_AMBULATORY_CARE_PROVIDER_SITE_OTHER): Payer: Managed Care, Other (non HMO) | Admitting: Internal Medicine

## 2011-04-29 ENCOUNTER — Encounter: Payer: Self-pay | Admitting: Internal Medicine

## 2011-04-29 DIAGNOSIS — E039 Hypothyroidism, unspecified: Secondary | ICD-10-CM

## 2011-04-29 DIAGNOSIS — Z853 Personal history of malignant neoplasm of breast: Secondary | ICD-10-CM

## 2011-04-29 MED ORDER — PROMETHAZINE HCL 25 MG PO TABS
25.0000 mg | ORAL_TABLET | Freq: Three times a day (TID) | ORAL | Status: DC | PRN
Start: 2011-04-29 — End: 2013-05-19

## 2011-04-29 MED ORDER — CIPROFLOXACIN HCL 500 MG PO TABS: 500.0000 mg | ORAL_TABLET | Freq: Two times a day (BID) | ORAL | Status: AC

## 2011-04-29 NOTE — Patient Instructions (Signed)
The patient is instructed to continue all medications as prescribed. Schedule followup with check out clerk upon leaving the clinic  

## 2011-04-29 NOTE — Progress Notes (Signed)
Subjective:    Patient ID: Jacqueline Jennings, female    DOB: August 12, 1950, 61 y.o.   MRN: [id number]  HPI The pt has been on increased  Synthroid and feels better but has not been able to limit calories 2 urinary tract infection which was treated with Cipro 500 mg twice a day for 5 days and her symptoms have abated.  She denies any chest pain shortness of breath PND orthopnea has had no palpitations or an elevated blood pressure while on the increased dose of Armour Thyroid   Review of Systems  Constitutional: Negative for activity change, appetite change and fatigue.  HENT: Negative for ear pain, congestion, neck pain, postnasal drip and sinus pressure.   Eyes: Negative for redness and visual disturbance.  Respiratory: Negative for cough, shortness of breath and wheezing.   Gastrointestinal: Negative for abdominal pain and abdominal distention.  Genitourinary: Negative for dysuria, frequency and menstrual problem.  Musculoskeletal: Negative for myalgias, joint swelling and arthralgias.  Skin: Negative for rash and wound.  Neurological: Negative for dizziness, weakness and headaches.  Hematological: Negative for adenopathy. Does not bruise/bleed easily.  Psychiatric/Behavioral: Negative for sleep disturbance and decreased concentration.   Past Medical History  Diagnosis Date  . Nephrolithiasis     hx of  . IBS (irritable bowel syndrome)   . Hyperlipidemia   . Breast cancer     hx of with rediation, surgery and adjuvant theapy with tamoxifen  (year two)    History   Social History  . Marital Status: Married    Spouse Name: N/A    Number of Children: N/A  . Years of Education: N/A   Occupational History  . Not on file.   Social History Main Topics  . Smoking status: Never Smoker   . Smokeless tobacco: Not on file  . Alcohol Use: No  . Drug Use: No  . Sexually Active: Yes   Other Topics Concern  . Not on file   Social History Narrative  . No narrative on file     Past Surgical History  Procedure Date  . Tonsillectomy   . Breast lumpectomy 1977  . Myomectomy 1990  . Kidney stones 1977  . Breast lumpectomy 2006    raditon 33 tx  . Cataract extraction     Family History  Problem Relation Age of Onset  . Hypertension Mother   . Diabetes Mother   . Cancer Mother     uterine    Allergies  Allergen Reactions  . Nabumetone     REACTION: Rash    Current Outpatient Prescriptions on File Prior to Visit  Medication Sig Dispense Refill  . ciprofloxacin (CIPRO) 500 MG tablet Take 1 tablet (500 mg total) by mouth 2 (two) times daily.  10 tablet  0  . ergocalciferol (VITAMIN D2) 50000 UNITS capsule Take 1 capsule (50,000 Units total) by mouth once a week.  12 capsule  0  . thyroid (ARMOUR THYROID) 120 MG tablet Take 1 tablet (120 mg total) by mouth daily.  30 tablet  11  . thyroid (ARMOUR THYROID) 90 MG tablet Take 1 tablet (90 mg total) by mouth daily.  90 tablet  3    BP 144/84  Pulse 76  Temp 98.2 F (36.8 C)  Resp 16  Ht 5\' 7"  (1.702 m)  Wt 216 lb (97.977 kg)  BMI 33.83 kg/m2       Objective:   Physical Exam  Nursing note and vitals reviewed. Constitutional: She is oriented to  person, place, and time. She appears well-developed and well-nourished. No distress.  HENT:  Head: Normocephalic and atraumatic.  Right Ear: External ear normal.  Left Ear: External ear normal.  Nose: Nose normal.  Mouth/Throat: Oropharynx is clear and moist.  Eyes: Conjunctivae and EOM are normal. Pupils are equal, round, and reactive to light.  Neck: Normal range of motion. Neck supple. No JVD present. No tracheal deviation present. No thyromegaly present.  Cardiovascular: Normal rate, regular rhythm, normal heart sounds and intact distal pulses.   No murmur heard. Pulmonary/Chest: Effort normal and breath sounds normal. She has no wheezes. She exhibits no tenderness.  Abdominal: Soft. Bowel sounds are normal.  Musculoskeletal: Normal range of  motion. She exhibits no edema and no tenderness.  Lymphadenopathy:    She has no cervical adenopathy.  Neurological: She is alert and oriented to person, place, and time. She has normal reflexes. No cranial nerve deficit.  Skin: Skin is warm and dry. She is not diaphoretic.  Psychiatric: She has a normal mood and affect. Her behavior is normal.          Assessment & Plan:  She has tolerated the increased dose of Armour Thyroid and her T3 and T4 were within normal limits we'll continue the 120 mg dose.  We have encouraged her to pursue weight loss and exercise through calorie limitation we also discussed hypoglycemia and the need to have long-acting or slow carbohydrates as a part of her diet as well as protein.  She is given ciprofloxacin and Phenergan for a trip she plans with her husband to [place] for an extended period of time.

## 2011-06-27 ENCOUNTER — Other Ambulatory Visit: Payer: Self-pay | Admitting: *Deleted

## 2011-06-27 MED ORDER — ERGOCALCIFEROL 1.25 MG (50000 UT) PO CAPS: 50000.0000 [IU] | ORAL_CAPSULE | ORAL | Status: DC

## 2011-07-23 NOTE — Progress Notes (Signed)
  Subjective:    Patient ID: Jacqueline Jennings, female    DOB: May 27, 1950, 61 y.o.   MRN: [id number]  HPI patient presents for followup of hyperlipidemia. We have been discussing her weight control and her lipid control. She's currently on no intervention other than Armour thyroid and vitamin D.  She has a history of breast cancer and is followed by oncology she has a history of obesity with a BMI between 30 and 35    Review of Systems  Constitutional: Negative for activity change, appetite change and fatigue.  HENT: Negative for ear pain, congestion, neck pain, postnasal drip and sinus pressure.   Eyes: Negative for redness and visual disturbance.  Respiratory: Negative for cough, shortness of breath and wheezing.   Gastrointestinal: Negative for abdominal pain and abdominal distention.  Genitourinary: Negative for dysuria, frequency and menstrual problem.  Musculoskeletal: Negative for myalgias, joint swelling and arthralgias.  Skin: Negative for rash and wound.  Neurological: Negative for dizziness, weakness and headaches.  Hematological: Negative for adenopathy. Does not bruise/bleed easily.  Psychiatric/Behavioral: Negative for sleep disturbance and decreased concentration.       Objective:   Physical Exam  Nursing note and vitals reviewed. Constitutional: She is oriented to person, place, and time. She appears well-developed and well-nourished. No distress.  HENT:  Head: Normocephalic and atraumatic.  Right Ear: External ear normal.  Left Ear: External ear normal.  Nose: Nose normal.  Mouth/Throat: Oropharynx is clear and moist.  Eyes: Conjunctivae and EOM are normal. Pupils are equal, round, and reactive to light.  Neck: Normal range of motion. Neck supple. No JVD present. No tracheal deviation present. No thyromegaly present.  Cardiovascular: Normal rate, regular rhythm, normal heart sounds and intact distal pulses.   No murmur heard. Pulmonary/Chest: Effort normal and  breath sounds normal. She has no wheezes. She exhibits no tenderness.  Abdominal: Soft. Bowel sounds are normal.  Musculoskeletal: Normal range of motion. She exhibits no edema and no tenderness.  Lymphadenopathy:    She has no cervical adenopathy.  Neurological: She is alert and oriented to person, place, and time. She has normal reflexes. No cranial nerve deficit.  Skin: Skin is warm and dry. She is not diaphoretic.  Psychiatric: She has a normal mood and affect. Her behavior is normal.          Assessment & Plan:  Weight management as her primary intervention treating her hyperlipidemia. We reviewed diet and exercise set goals for weight loss discussed the use of supplements such as fish oil . Followup in 2 months time. Rechecking her lipid and liver and weight

## 2011-09-03 ENCOUNTER — Other Ambulatory Visit (INDEPENDENT_AMBULATORY_CARE_PROVIDER_SITE_OTHER): Payer: Managed Care, Other (non HMO)

## 2011-09-03 DIAGNOSIS — E039 Hypothyroidism, unspecified: Secondary | ICD-10-CM

## 2011-09-03 LAB — TSH: TSH: 0.04 u[IU]/mL — ABNORMAL LOW (ref 0.35–5.50)

## 2011-09-10 ENCOUNTER — Ambulatory Visit: Payer: Managed Care, Other (non HMO) | Admitting: Internal Medicine

## 2011-09-23 ENCOUNTER — Other Ambulatory Visit: Payer: Self-pay | Admitting: Orthopedic Surgery

## 2011-09-23 NOTE — Progress Notes (Signed)
Preoperative surgical orders have been place into the Epic hospital system for [name] on 09/23/2011, 10:33 AM  by Patrica Duel for surgery on 10/02/2011.  Preop Knee orders including  IV Tylenol, and IV Decadron as long as there are no contraindications to the above medications. Avel Peace, PA-C

## 2011-09-27 ENCOUNTER — Encounter (HOSPITAL_COMMUNITY)
Admission: RE | Admit: 2011-09-27 | Discharge: 2011-09-27 | Disposition: A | Discharge status: Home / Self Care | Payer: Managed Care, Other (non HMO) | Source: Ambulatory Visit | Attending: Orthopedic Surgery | Admitting: Orthopedic Surgery

## 2011-09-27 ENCOUNTER — Ambulatory Visit (INDEPENDENT_AMBULATORY_CARE_PROVIDER_SITE_OTHER): Payer: Managed Care, Other (non HMO) | Admitting: Internal Medicine

## 2011-09-27 ENCOUNTER — Encounter (HOSPITAL_COMMUNITY): Payer: Self-pay

## 2011-09-27 ENCOUNTER — Encounter: Payer: Self-pay | Admitting: Internal Medicine

## 2011-09-27 ENCOUNTER — Encounter (HOSPITAL_COMMUNITY): Payer: Self-pay | Admitting: Pharmacy Technician

## 2011-09-27 VITALS — BP 128/80 | HR 72 | Temp 98.3°F | Resp 16 | Ht 66.0 in | Wt 215.0 lb

## 2011-09-27 DIAGNOSIS — E559 Vitamin D deficiency, unspecified: Secondary | ICD-10-CM

## 2011-09-27 DIAGNOSIS — E785 Hyperlipidemia, unspecified: Secondary | ICD-10-CM

## 2011-09-27 DIAGNOSIS — M1990 Unspecified osteoarthritis, unspecified site: Secondary | ICD-10-CM

## 2011-09-27 DIAGNOSIS — E039 Hypothyroidism, unspecified: Secondary | ICD-10-CM

## 2011-09-27 DIAGNOSIS — G4730 Sleep apnea, unspecified: Secondary | ICD-10-CM

## 2011-09-27 HISTORY — DX: Unspecified osteoarthritis, unspecified site: M19.90

## 2011-09-27 HISTORY — DX: Hypothyroidism, unspecified: E03.9

## 2011-09-27 HISTORY — DX: Sleep apnea, unspecified: G47.30

## 2011-09-27 LAB — BASIC METABOLIC PANEL
BUN: 19 mg/dL (ref 6–23)
Calcium: 9.6 mg/dL (ref 8.4–10.5)
Chloride: 105 mEq/L (ref 96–112)
Creatinine, Ser: 0.63 mg/dL (ref 0.50–1.10)
GFR calc Af Amer: >90 mL/min (ref >90)
GFR calc non Af Amer: >90 mL/min (ref >90)

## 2011-09-27 LAB — SURGICAL PCR SCREEN: MRSA, PCR: NEGATIVE

## 2011-09-27 MED ORDER — THYROID 120 MG PO TABS: 120.0000 mg | ORAL_TABLET | Freq: Every day | ORAL | Status: DC

## 2011-09-27 NOTE — Pre-Procedure Instructions (Signed)
No EKG/ CXR required per guidelines

## 2011-09-27 NOTE — Patient Instructions (Signed)
"  practical paleo" 

## 2011-09-27 NOTE — Patient Instructions (Addendum)
[address]  09/27/2011   Your procedure is scheduled on: 6-12   -2013  Report to [facility] at      0745  AM.  Call this number if you have problems the morning of surgery: [phone]   Remember:   Do not eat food:After Midnight.    Take these medicines the morning of surgery with A SIP OF WATER: Armour Thyroid med.Bring nose clip with pillars for cpap.   Do not wear jewelry, make-up or nail polish.  Do not wear lotions, powders, or perfumes. You may wear deodorant.  Do not shave 48 hours prior to surgery.(face and neck okay, no shaving of legs)  Do not bring valuables to the hospital.  Contacts, dentures or bridgework may not be worn into surgery.  Leave suitcase in the car. After surgery it may be brought to your room.  For patients admitted to the hospital, checkout time is 11:00 AM the day of discharge.   Patients discharged the day of surgery will not be allowed to drive home.  Name and phone number of your driver: family  Special Instructions: CHG Shower Use Special Wash: 1/2 bottle night before surgery and 1/2 bottle morning of surgery.(avoid face and genitals)   Please read over the following fact sheets that you were given: MRSA Information.

## 2011-10-01 NOTE — H&P (Signed)
  CC- Jacqueline Jennings is a 61 y.o. female who presents with right knee pain.  HPI- . Knee Pain: Patient presents with knee pain involving the  right knee. Onset of the symptoms was several months ago. Inciting event: none known. Current symptoms include giving out, locking and pain located medially. Pain is aggravated by going up and down stairs, kneeling, pivoting and squatting.  Patient has had no prior knee problems. Evaluation to date: MRI: abnormal medial meniscal tear. Treatment to date: corticosteroid injection which was not very effective.  Past Medical History  Diagnosis Date  . Nephrolithiasis     hx of  . IBS (irritable bowel syndrome)   . Hyperlipidemia   . Breast cancer     hx of with rediation, surgery and adjuvant theapy with tamoxifen  (year two)  . Hypothyroidism 09-27-11    Low function  . Sleep apnea 09-27-11    Cpap used ,started 2'2011  . Arthritis 09-27-11    Right knee/Cortisone injection 2'13    Past Surgical History  Procedure Date  . Tonsillectomy   . Myomectomy 1990  . Kidney stones 1977  . Cataract extraction 09-27-11    Bilateral  . Breast lumpectomy 1977  . Breast lumpectomy 2006    raditon 33 tx-right    Prior to Admission medications   Medication Sig Start Date End Date Taking? Authorizing Provider  diclofenac sodium (VOLTAREN) 1 % GEL Apply 1 application topically 4 (four) times daily.    Historical Provider, MD  ergocalciferol (VITAMIN D2) 50000 UNITS capsule Take 50,000 Units by mouth once a week. varies 06/27/11   Stacie Glaze, MD  meloxicam (MOBIC) 15 MG tablet Take 15 mg by mouth every evening.    Historical Provider, MD  thyroid (ARMOUR) 120 MG tablet Take 1 tablet (120 mg total) by mouth daily with breakfast. (No substitutions) 09/27/11 09/26/12  Stacie Glaze, MD   KNEE EXAM antalgic gait, soft tissue tenderness over medial joint line, negative drawer sign, negative pivot-shift, collateral ligaments intact, normal ipsilateral hip  exam  Physical Examination: General appearance - alert, well appearing, and in no distress Mental status - alert, oriented to person, place, and time Chest - clear to auscultation, no wheezes, rales or rhonchi, symmetric air entry Heart - normal rate, regular rhythm, normal S1, S2, no murmurs, rubs, clicks or gallops Abdomen - soft, nontender, nondistended, no masses or organomegaly    Asessment/Plan--- Right knee medial meniscal tear- - Plan Rightt knee arthroscopy with meniscal debridement. Procedure risks and potential comps discussed with patient who elects to proceed. Goals are decreased pain and increased function with a high likelihood of achieving both

## 2011-10-02 ENCOUNTER — Encounter (HOSPITAL_COMMUNITY)
Admission: RE | Disposition: A | Discharge status: Home / Self Care | Payer: Self-pay | Source: Ambulatory Visit | Attending: Orthopedic Surgery

## 2011-10-02 ENCOUNTER — Encounter (HOSPITAL_COMMUNITY): Payer: Self-pay | Admitting: Certified Registered Nurse Anesthetist

## 2011-10-02 ENCOUNTER — Ambulatory Visit (HOSPITAL_COMMUNITY)
Admission: RE | Admit: 2011-10-02 | Payer: Managed Care, Other (non HMO) | Source: Ambulatory Visit | Admitting: Orthopedic Surgery

## 2011-10-02 ENCOUNTER — Ambulatory Visit (HOSPITAL_COMMUNITY): Payer: Managed Care, Other (non HMO) | Admitting: Certified Registered Nurse Anesthetist

## 2011-10-02 ENCOUNTER — Ambulatory Visit (HOSPITAL_COMMUNITY)
Admission: RE | Admit: 2011-10-02 | Discharge: 2011-10-02 | Disposition: A | Discharge status: Home / Self Care | Payer: Managed Care, Other (non HMO) | Source: Ambulatory Visit | Attending: Orthopedic Surgery | Admitting: Orthopedic Surgery

## 2011-10-02 ENCOUNTER — Encounter (HOSPITAL_COMMUNITY): Payer: Self-pay | Admitting: *Deleted

## 2011-10-02 DIAGNOSIS — E785 Hyperlipidemia, unspecified: Secondary | ICD-10-CM | POA: Insufficient documentation

## 2011-10-02 DIAGNOSIS — S83249A Other tear of medial meniscus, current injury, unspecified knee, initial encounter: Secondary | ICD-10-CM

## 2011-10-02 DIAGNOSIS — Z01812 Encounter for preprocedural laboratory examination: Secondary | ICD-10-CM | POA: Insufficient documentation

## 2011-10-02 DIAGNOSIS — M23329 Other meniscus derangements, posterior horn of medial meniscus, unspecified knee: Secondary | ICD-10-CM | POA: Insufficient documentation

## 2011-10-02 DIAGNOSIS — G4730 Sleep apnea, unspecified: Secondary | ICD-10-CM | POA: Insufficient documentation

## 2011-10-02 DIAGNOSIS — Z79899 Other long term (current) drug therapy: Secondary | ICD-10-CM | POA: Insufficient documentation

## 2011-10-02 DIAGNOSIS — M2240 Chondromalacia patellae, unspecified knee: Secondary | ICD-10-CM | POA: Insufficient documentation

## 2011-10-02 DIAGNOSIS — C50919 Malignant neoplasm of unspecified site of unspecified female breast: Secondary | ICD-10-CM | POA: Insufficient documentation

## 2011-10-02 DIAGNOSIS — E039 Hypothyroidism, unspecified: Secondary | ICD-10-CM | POA: Insufficient documentation

## 2011-10-02 HISTORY — PX: KNEE ARTHROSCOPY: SHX127

## 2011-10-02 HISTORY — DX: Other tear of medial meniscus, current injury, unspecified knee, initial encounter: S83.249A

## 2011-10-02 SURGERY — ARTHROSCOPY, KNEE
Anesthesia: General | Site: Knee | Laterality: Right | Wound class: Clean

## 2011-10-02 SURGERY — ARTHROSCOPY, KNEE
Anesthesia: Choice | Site: Knee | Laterality: Right

## 2011-10-02 MED ORDER — CHLORHEXIDINE GLUCONATE 4 % EX LIQD: 60.0000 mL | Freq: Once | CUTANEOUS | Status: DC

## 2011-10-02 MED ORDER — OXYCODONE HCL 5 MG PO TABS
5.0000 mg | ORAL_TABLET | ORAL | Status: DC | PRN
  Administered 2011-10-02: 5 mg via ORAL

## 2011-10-02 MED ORDER — BUPIVACAINE-EPINEPHRINE 0.25% -1:200000 IJ SOLN
INTRAMUSCULAR | Status: DC | PRN
  Administered 2011-10-02: 20 mL

## 2011-10-02 MED ORDER — ACETAMINOPHEN 10 MG/ML IV SOLN
1000.0000 mg | Freq: Once | INTRAVENOUS | Status: AC
  Administered 2011-10-02: 1000 mg via INTRAVENOUS

## 2011-10-02 MED ORDER — ACETAMINOPHEN 10 MG/ML IV SOLN
INTRAVENOUS | Status: AC
  Filled 2011-10-02: qty 100

## 2011-10-02 MED ORDER — HYDROMORPHONE HCL PF 1 MG/ML IJ SOLN
INTRAMUSCULAR | Status: AC
  Filled 2011-10-02: qty 1

## 2011-10-02 MED ORDER — DEXAMETHASONE SODIUM PHOSPHATE 10 MG/ML IJ SOLN
10.0000 mg | Freq: Once | INTRAMUSCULAR | Status: AC
  Administered 2011-10-02: 10 mg via INTRAVENOUS

## 2011-10-02 MED ORDER — LACTATED RINGERS IR SOLN
Status: DC | PRN
  Administered 2011-10-02: 3000 mL

## 2011-10-02 MED ORDER — LACTATED RINGERS IV SOLN: INTRAVENOUS | Status: DC

## 2011-10-02 MED ORDER — PROMETHAZINE HCL 25 MG/ML IJ SOLN: 6.2500 mg | INTRAMUSCULAR | Status: DC | PRN

## 2011-10-02 MED ORDER — BUPIVACAINE-EPINEPHRINE 0.25% -1:200000 IJ SOLN
INTRAMUSCULAR | Status: AC
  Filled 2011-10-02: qty 1

## 2011-10-02 MED ORDER — OXYCODONE HCL 5 MG PO TABS
ORAL_TABLET | ORAL | Status: AC
  Filled 2011-10-02: qty 1

## 2011-10-02 MED ORDER — FENTANYL CITRATE 0.05 MG/ML IJ SOLN
INTRAMUSCULAR | Status: DC | PRN
  Administered 2011-10-02 (×4): 50 ug via INTRAVENOUS

## 2011-10-02 MED ORDER — MIDAZOLAM HCL 5 MG/5ML IJ SOLN
INTRAMUSCULAR | Status: DC | PRN
  Administered 2011-10-02: 2 mg via INTRAVENOUS

## 2011-10-02 MED ORDER — SODIUM CHLORIDE 0.9 % IV SOLN: INTRAVENOUS | Status: DC

## 2011-10-02 MED ORDER — METHOCARBAMOL 500 MG PO TABS: 500.0000 mg | ORAL_TABLET | Freq: Four times a day (QID) | ORAL | Status: AC

## 2011-10-02 MED ORDER — PROPOFOL 10 MG/ML IV EMUL
INTRAVENOUS | Status: DC | PRN
  Administered 2011-10-02: 200 mg via INTRAVENOUS

## 2011-10-02 MED ORDER — LACTATED RINGERS IV SOLN
INTRAVENOUS | Status: DC | PRN
  Administered 2011-10-02: 09:00:00 via INTRAVENOUS

## 2011-10-02 MED ORDER — OXYCODONE HCL 5 MG PO TABS: 5.0000 mg | ORAL_TABLET | ORAL | Status: AC | PRN

## 2011-10-02 MED ORDER — CEFAZOLIN SODIUM-DEXTROSE 2-3 GM-% IV SOLR
2.0000 g | INTRAVENOUS | Status: AC
  Administered 2011-10-02: 2 g via INTRAVENOUS

## 2011-10-02 MED ORDER — HYDROMORPHONE HCL PF 1 MG/ML IJ SOLN
0.2500 mg | INTRAMUSCULAR | Status: DC | PRN
  Administered 2011-10-02 (×2): 0.25 mg via INTRAVENOUS

## 2011-10-02 MED ORDER — CEFAZOLIN SODIUM 1-5 GM-% IV SOLN
INTRAVENOUS | Status: AC
  Filled 2011-10-02: qty 100

## 2011-10-02 MED ORDER — LIDOCAINE HCL (CARDIAC) 20 MG/ML IV SOLN
INTRAVENOUS | Status: DC | PRN
  Administered 2011-10-02: 100 mg via INTRAVENOUS

## 2011-10-02 MED ORDER — ONDANSETRON HCL 4 MG/2ML IJ SOLN
INTRAMUSCULAR | Status: DC | PRN
  Administered 2011-10-02: 4 mg via INTRAVENOUS

## 2011-10-02 SURGICAL SUPPLY — 26 items
BANDAGE ELASTIC 6 VELCRO ST LF (GAUZE/BANDAGES/DRESSINGS) ×1 IMPLANT
BLADE 4.2CUDA (BLADE) ×2 IMPLANT
BLADE CUDA SHAVER 3.5 (BLADE) ×2 IMPLANT
CLOTH BEACON ORANGE TIMEOUT ST (SAFETY) ×2 IMPLANT
CUFF TOURN SGL QUICK 34 (TOURNIQUET CUFF) ×2
CUFF TRNQT CYL 34X4X40X1 (TOURNIQUET CUFF) ×1 IMPLANT
DRAPE U-SHAPE 47X51 STRL (DRAPES) ×2 IMPLANT
DRSG EMULSION OIL 3X3 NADH (GAUZE/BANDAGES/DRESSINGS) ×2 IMPLANT
DRSG PAD ABDOMINAL 8X10 ST (GAUZE/BANDAGES/DRESSINGS) ×2 IMPLANT
DURAPREP 26ML APPLICATOR (WOUND CARE) ×2 IMPLANT
GLOVE BIO SURGEON STRL SZ7.5 (GLOVE) ×1 IMPLANT
GLOVE BIO SURGEON STRL SZ8 (GLOVE) ×2 IMPLANT
GLOVE BIOGEL PI IND STRL 8 (GLOVE) ×1 IMPLANT
GLOVE BIOGEL PI INDICATOR 8 (GLOVE) ×1
GOWN STRL NON-REIN LRG LVL3 (GOWN DISPOSABLE) ×3 IMPLANT
MANIFOLD NEPTUNE II (INSTRUMENTS) ×4 IMPLANT
PACK ARTHROSCOPY WL (CUSTOM PROCEDURE TRAY) ×2 IMPLANT
PACK ICE MAXI GEL EZY WRAP (MISCELLANEOUS) ×6 IMPLANT
PADDING CAST COTTON 6X4 STRL (CAST SUPPLIES) ×2 IMPLANT
POSITIONER SURGICAL ARM (MISCELLANEOUS) ×2 IMPLANT
SET ARTHROSCOPY TUBING (MISCELLANEOUS) ×2
SET ARTHROSCOPY TUBING LN (MISCELLANEOUS) ×1 IMPLANT
SPONGE GAUZE 4X4 12PLY (GAUZE/BANDAGES/DRESSINGS) ×1 IMPLANT
SUT ETHILON 4 0 PS 2 18 (SUTURE) ×2 IMPLANT
TOWEL OR 17X26 10 PK STRL BLUE (TOWEL DISPOSABLE) ×2 IMPLANT
WRAP KNEE MAXI GEL POST OP (GAUZE/BANDAGES/DRESSINGS) ×4 IMPLANT

## 2011-10-02 NOTE — Discharge Instructions (Signed)
Arthroscopic Procedure, Knee An arthroscopic procedure can find what is wrong with your knee. PROCEDURE Arthroscopy is a surgical technique that allows your orthopedic surgeon to diagnose and treat your knee injury with accuracy. They will look into your knee through a small instrument. This is almost like a small (pencil sized) telescope. Because arthroscopy affects your knee less than open knee surgery, you can anticipate a more rapid recovery. Taking an active role by following your caregiver's instructions will help with rapid and complete recovery. Use crutches, rest, elevation, ice, and knee exercises as instructed. The length of recovery depends on various factors including type of injury, age, physical condition, medical conditions, and your rehabilitation. Your knee is the joint between the large bones (femur and tibia) in your leg. Cartilage covers these bone ends which are smooth and slippery and allow your knee to bend and move smoothly. Two menisci, thick, semi-lunar shaped pads of cartilage which form a rim inside the joint, help absorb shock and stabilize your knee. Ligaments bind the bones together and support your knee joint. Muscles move the joint, help support your knee, and take stress off the joint itself. Because of this all programs and physical therapy to rehabilitate an injured or repaired knee require rebuilding and strengthening your muscles. AFTER THE PROCEDURE  After the procedure, you will be moved to a recovery area until most of the effects of the medication have worn off. Your caregiver will discuss the test results with you.   Only take over-the-counter or prescription medicines for pain, discomfort, or fever as directed by your caregiver.  SEEK MEDICAL CARE IF:   You have increased bleeding from your wounds.   You see redness, swelling, or have increasing pain in your wounds.   You have pus coming from your wound.   You have an oral temperature above 102 F (38.9  C).   You notice a bad smell coming from the wound or dressing.   You have severe pain with any motion of your knee.  SEEK IMMEDIATE MEDICAL CARE IF:   You develop a rash.   You have difficulty breathing.   You have any allergic problems.  Document Released: 04/05/2000 Document Revised: 03/28/2011 Document Reviewed: 10/28/2007 [employer] Patient Information 2012 Exit[employer], LLCArthroscopic Procedure, Knee An arthroscopic procedure can find what is wrong with your knee. PROCEDURE Arthroscopy is a surgical technique that allows your orthopedic surgeon to diagnose and treat your knee injury with accuracy. They will look into your knee through a small instrument. This is almost like a small (pencil sized) telescope. Because arthroscopy affects your knee less than open knee surgery, you can anticipate a more rapid recovery. Taking an active role by following your caregiver's instructions will help with rapid and complete recovery. Use crutches, rest, elevation, ice, and knee exercises as instructed. The length of recovery depends on various factors including type of injury, age, physical condition, medical conditions, and your rehabilitation. Your knee is the joint between the large bones (femur and tibia) in your leg. Cartilage covers these bone ends which are smooth and slippery and allow your knee to bend and move smoothly. Two menisci, thick, semi-lunar shaped pads of cartilage which form a rim inside the joint, help absorb shock and stabilize your knee. Ligaments bind the bones together and support your knee joint. Muscles move the joint, help support your knee, and take stress off the joint itself. Because of this all programs and physical therapy to rehabilitate an injured or repaired knee require rebuilding and  strengthening your muscles. AFTER THE PROCEDURE After the procedure, you will be moved to a recovery area until most of the effects of the medication have worn off. Your caregiver will  discuss the test results with you.  Only take over-the-counter or prescription medicines for pain, discomfort, or fever as directed by your caregiver.  SEEK MEDICAL CARE IF:  You have increased bleeding from your wounds.  You see redness, swelling, or have increasing pain in your wounds.  You have pus coming from your wound.  You have an oral temperature above 102 F (38.9 C).  You notice a bad smell coming from the wound or dressing.  You have severe pain with any motion of your knee.  SEEK IMMEDIATE MEDICAL CARE IF:  You develop a rash.  You have difficulty breathing.  You have any allergic problems.  Document Released: 04/05/2000 Document Revised: 03/28/2011 Document Reviewed: 10/28/2007 [facility] Patient Information 2012 [place], [place].Arthroscopic Procedure, Knee Care After Refer to this sheet in the next few weeks. These discharge instructions provide you with general information on caring for yourself after you leave the hospital. Your caregiver may also give you specific instructions. Your treatment has been planned according to the most current medical practices available, but unavoidable complications sometimes occur. If you have any problems or questions after discharge, please call your caregiver. HOME CARE INSTRUCTIONS  It will be normal to be sore for a couple days after surgery. See your caregiver if this seems to be getting worse rather than better. Only take over-the-counter or prescription medicines for pain, discomfort, or fever as directed by your caregiver.  Take showers rather than baths, or as directed by your caregiver.   Change bandages (dressings) if necessary or as directed.   You may resume normal diet and activities as directed or allowed.   Avoid lifting or driving until you are directed otherwise.   Make an appointment to see your caregiver for stitches (suture) or staple removal as directed.   You may put ice on the area.   Put ice in a plastic bag.    Place a towel between your skin and the bag.   Leave the ice on for 15 to 20 minutes, 3 to 4 times per day for the first 2 days.  SEEK MEDICAL CARE IF:   You have increased bleeding from your wounds.   You see redness, swelling, or have increasing pain in your wounds.   You have pus coming from your wound.   You have an oral temperature above 102 F (38.9 C).   You notice a bad smell coming from the wound or dressing.   You have severe pain with any motion of your knee.  SEEK IMMEDIATE MEDICAL CARE IF:   You develop a rash.   You have difficulty breathing   You develop any reaction or side effects to medicines taken.  MAKE SURE YOU:   Understand these instructions.   Will watch your condition.   Will get help right away if you are not doing well or get worse.  Document Released: 10/26/2004 Document Revised: 03/28/2011 Document Reviewed: 07/02/2007 [facility] Patient Information 2012 [place], [place].Marland Kitchen

## 2011-10-02 NOTE — Progress Notes (Signed)
Ortho tech here c crutches and is walking pt in hallway

## 2011-10-02 NOTE — Progress Notes (Signed)
"

## 2011-10-02 NOTE — Interval H&P Note (Signed)
History and Physical Interval Note:  10/02/2011 9:47 AM  Jacqueline Jennings  has presented today for surgery, with the diagnosis of right knee medial meniscus tear  The various methods of treatment have been discussed with the patient and family. After consideration of risks, benefits and other options for treatment, the patient has consented to  Procedure(s) (LRB): ARTHROSCOPY KNEE (Right) as a surgical intervention .  The patients' history has been reviewed, patient examined, no change in status, stable for surgery.  I have reviewed the patients' chart and labs.  Questions were answered to the patient's satisfaction.     Loanne Drilling

## 2011-10-02 NOTE — Preoperative (Signed)
Beta Blockers   Reason not to administer Beta Blockers:Not Applicable 

## 2011-10-02 NOTE — Op Note (Signed)
Preoperative diagnosis-  Right knee medial meniscal tear  Postoperative diagnosis Right- knee medial meniscal tear   Plus Right medial femoral chondral defect  Procedure- Right knee arthroscopy with medial  Meniscal debridement and chondroplasty  Surgeon- Gus Rankin. Raneen Jaffer, MD  Anesthesia-General  EBL-  minimal Complications- None  Condition- PACU - hemodynamically stable.  Brief clinical note- -Jacqueline Jennings is a 61 y.o.  female with a several month history of left knee pain and mechanical symptoms. Exam and history suggested medial meniscal tear confirmed by MRI. The patient presents now for arthroscopy and debridement   Procedure in detail -       After successful administration of General anesthetic, a tourmiquet is placed high on the Right  thigh and the Right lower extremity is prepped and draped in the usual sterile fashion. Time out is performed by the surgical team. Standard superomedial and inferolateral portal sites are marked and incisions made with an 11 blade. The inflow cannula is passed through the superomedial portal and camera through the inferolateral portal and inflow is initiated. Arthroscopic visualization proceeds.      The undersurface of the patella and trochlea are visualized and there is mild chondromalacia patella without unstable cartilage lesions. The trochlea appears normal. The medial and lateral gutters are visualized and there are  no loose bodies. Flexion and valgus force is applied to the knee and the medial compartment is entered. A spinal needle is passed into the joint through the site marked for the inferomedial portal. A small incision is made and the dilator passed into the joint. The findings for the medial compartment are medial meniscal tear body and posterior horn and 1 x 1 cm defect medial femoral condyle which is unstable . The tear is debrided to a stable base with baskets and a shaver and sealed off with the Arthrocare. The shaver is used  to debride the unstable cartilage to a stable cartilaginous base with stable edges. It is probed and found to be stable. There is no exposed bone    The intercondylar notch is visualized and the ACL appears normal. The lateral compartment is entered and the findings are normal .      The joint is again inspected and there are no other tears, defects or loose bodies identified. The arthroscopic equipment is then removed from the inferior portals which are closed with interrupted 4-0 nylon. 20 ml of .25% Marcaine with epinephrine are injected through the inflow cannula and the cannula is then removed and the portal closed with nylon. The incisions are cleaned and dried and a bulky sterile dressing is applied. The patient is then awakened and transported to recovery in stable condition.   10/02/2011, 10:52 AM

## 2011-10-02 NOTE — Anesthesia Postprocedure Evaluation (Signed)
Anesthesia Post Note  Patient: Jacqueline Jennings  Procedure(s) Performed: Procedure(s) (LRB): ARTHROSCOPY KNEE (Right)  Anesthesia type: General  Patient location: PACU  Post pain: Pain level controlled  Post assessment: Post-op Vital signs reviewed  Last Vitals:  Filed Vitals:   10/02/11 1130  BP: 135/62  Pulse: 67  Temp: 36.7 C  Resp: 17    Post vital signs: Reviewed  Level of consciousness: sedated  Complications: No apparent anesthesia complications

## 2011-10-02 NOTE — Anesthesia Preprocedure Evaluation (Signed)
Anesthesia Evaluation  Patient identified by MRN, date of birth, ID band Patient awake    Reviewed: Allergy & Precautions, H&P , NPO status , Patient's Chart, lab work & pertinent test results  Airway Mallampati: III TM Distance: >3 FB Neck ROM: Full    Dental  (+) Teeth Intact   Pulmonary sleep apnea and Continuous Positive Airway Pressure Ventilation ,  breath sounds clear to auscultation  Pulmonary exam normal       Cardiovascular negative cardio ROS  Rhythm:Regular     Neuro/Psych negative neurological ROS  negative psych ROS   GI/Hepatic negative GI ROS, Neg liver ROS,   Endo/Other  Hypothyroidism   Renal/GU negative Renal ROS  negative genitourinary   Musculoskeletal negative musculoskeletal ROS (+)   Abdominal   Peds negative pediatric ROS (+)  Hematology negative hematology ROS (+)   Anesthesia Other Findings   Reproductive/Obstetrics negative OB ROS History of Breast Cancer; radiation and chemotherapy                           Anesthesia Physical Anesthesia Plan  ASA: II  Anesthesia Plan: General   Post-op Pain Management:    Induction: Intravenous  Airway Management Planned: LMA  Additional Equipment:   Intra-op Plan:   Post-operative Plan: Extubation in OR  Informed Consent: I have reviewed the patients History and Physical, chart, labs and discussed the procedure including the risks, benefits and alternatives for the proposed anesthesia with the patient or authorized representative who has indicated his/her understanding and acceptance.   Dental advisory given  Plan Discussed with: CRNA  Anesthesia Plan Comments:         Anesthesia Quick Evaluation

## 2011-10-02 NOTE — Transfer of Care (Signed)
Immediate Anesthesia Transfer of Care Note  Patient: Jacqueline Jennings  Procedure(s) Performed: Procedure(s) (LRB): ARTHROSCOPY KNEE (Right)  Patient Location: PACU  Anesthesia Type: General  Level of Consciousness: awake, alert  and oriented  Airway & Oxygen Therapy: Patient Spontanous Breathing and Patient connected to face mask oxygen  Post-op Assessment: Report given to PACU RN and Post -op Vital signs reviewed and stable  Post vital signs: Reviewed and stable  Complications: No apparent anesthesia complications

## 2011-10-04 ENCOUNTER — Encounter (HOSPITAL_COMMUNITY): Payer: Self-pay | Admitting: Orthopedic Surgery

## 2011-10-15 ENCOUNTER — Telehealth: Payer: Self-pay | Admitting: Internal Medicine

## 2011-10-15 NOTE — Telephone Encounter (Signed)
Caller: Jacqueline Jennings/Patient; PCP: Darryll Capers; CB#: [phone]; ; ; Call regarding Urinary Pain/Bleeding;    LMP -  Post Menopausal -- Today, 10/15/2011, has  had dysuria with some frequency. NO fever.  RN reached See in 24 hrs  for one or more urinary tract symptoms and not previously evaluated per Urinary Symptoms - Female protocol  . RN offered to schedule appt for this afternoon or  10/16/2011  morning but pt wanting to talk with "Kendal Hymen" about lab visit  only where she can submit UA sample as she has done in the past.  RN sent note to [facility]  --Please call pt back at  [phone]

## 2011-10-15 NOTE — Telephone Encounter (Signed)
Has cipro-with sig to take prn uti- will test and take as instructed encouraged to see md if no better in 3 days-she agrees

## 2011-10-25 ENCOUNTER — Encounter: Payer: Self-pay | Admitting: Internal Medicine

## 2011-11-29 ENCOUNTER — Ambulatory Visit: Payer: Managed Care, Other (non HMO) | Admitting: Internal Medicine

## 2011-12-31 ENCOUNTER — Other Ambulatory Visit: Payer: Self-pay | Admitting: Obstetrics and Gynecology

## 2012-01-14 NOTE — Progress Notes (Signed)
  Subjective:    Patient ID: Jacqueline Jennings, female    DOB: [birth year], 61 y.o.   MRN: [id number]  HPI Presurgical visit for clearance for a meniscal tear Past medical history significant for obstructive sleep apnea irritable bowel and hypothyroidism as well as moderate obesity   Review of Systems  Constitutional: Negative for activity change, appetite change and fatigue.  HENT: Negative for ear pain, congestion, neck pain, postnasal drip and sinus pressure.   Eyes: Negative for redness and visual disturbance.  Respiratory: Negative for cough, shortness of breath and wheezing.   Gastrointestinal: Negative for abdominal pain and abdominal distention.  Genitourinary: Negative for dysuria, frequency and menstrual problem.  Musculoskeletal: Negative for myalgias, joint swelling and arthralgias.  Skin: Negative for rash and wound.  Neurological: Negative for dizziness, weakness and headaches.  Hematological: Negative for adenopathy. Does not bruise/bleed easily.  Psychiatric/Behavioral: Negative for disturbed wake/sleep cycle and decreased concentration.       Objective:   Physical Exam  Nursing note and vitals reviewed. Constitutional: She is oriented to person, place, and time. She appears well-developed and well-nourished. No distress.  HENT:  Head: Normocephalic and atraumatic.  Right Ear: External ear normal.  Left Ear: External ear normal.  Nose: Nose normal.  Mouth/Throat: Oropharynx is clear and moist.  Eyes: Conjunctivae normal and EOM are normal. Pupils are equal, round, and reactive to light.  Neck: Normal range of motion. Neck supple. No JVD present. No tracheal deviation present. No thyromegaly present.  Cardiovascular: Normal rate, regular rhythm, normal heart sounds and intact distal pulses.   No murmur heard. Pulmonary/Chest: Effort normal and breath sounds normal. She has no wheezes. She exhibits no tenderness.  Abdominal: Soft. Bowel sounds are normal.    Musculoskeletal: Normal range of motion. She exhibits no edema and no tenderness.  Lymphadenopathy:    She has no cervical adenopathy.  Neurological: She is alert and oriented to person, place, and time. She has normal reflexes. No cranial nerve deficit.  Skin: Skin is warm and dry. She is not diaphoretic.  Psychiatric: She has a normal mood and affect. Her behavior is normal.          Assessment & Plan:  Surgical clearance given for knee surgery.  Stable hypothyroidism stable sleep apnea Discussion of irritable bowel syndrome and probiotics Weight reduction and diet discussed

## 2012-01-17 ENCOUNTER — Ambulatory Visit: Payer: Managed Care, Other (non HMO) | Admitting: Internal Medicine

## 2012-02-11 ENCOUNTER — Encounter: Payer: Self-pay | Admitting: Gastroenterology

## 2012-02-20 ENCOUNTER — Ambulatory Visit (INDEPENDENT_AMBULATORY_CARE_PROVIDER_SITE_OTHER): Payer: Managed Care, Other (non HMO) | Admitting: Internal Medicine

## 2012-02-20 VITALS — BP 140/80 | HR 72 | Temp 98.1°F | Resp 16 | Ht 66.0 in | Wt 215.0 lb

## 2012-02-20 DIAGNOSIS — M810 Age-related osteoporosis without current pathological fracture: Secondary | ICD-10-CM

## 2012-02-20 DIAGNOSIS — Z23 Encounter for immunization: Secondary | ICD-10-CM

## 2012-02-20 DIAGNOSIS — E039 Hypothyroidism, unspecified: Secondary | ICD-10-CM

## 2012-02-20 DIAGNOSIS — R5381 Other malaise: Secondary | ICD-10-CM

## 2012-02-20 DIAGNOSIS — R5383 Other fatigue: Secondary | ICD-10-CM

## 2012-02-20 DIAGNOSIS — Z2911 Encounter for prophylactic immunotherapy for respiratory syncytial virus (RSV): Secondary | ICD-10-CM

## 2012-02-20 NOTE — Progress Notes (Signed)
  Subjective:    Patient ID: Jacqueline Jennings, female    DOB: 1951-04-01, 62 y.o.   MRN: [id number]  HPI Never feels good No energy Weight follow up On cpap sleeping better Thyroid Stress and anxiety    Review of Systems  Constitutional: Negative for activity change, appetite change and fatigue.  HENT: Negative for ear pain, congestion, neck pain, postnasal drip and sinus pressure.   Eyes: Negative for redness and visual disturbance.  Respiratory: Negative for cough, shortness of breath and wheezing.   Gastrointestinal: Negative for abdominal pain and abdominal distention.  Genitourinary: Negative for dysuria, frequency and menstrual problem.  Musculoskeletal: Negative for myalgias, joint swelling and arthralgias.  Skin: Negative for rash and wound.  Neurological: Negative for dizziness, weakness and headaches.  Hematological: Negative for adenopathy. Does not bruise/bleed easily.  Psychiatric/Behavioral: Negative for disturbed wake/sleep cycle and decreased concentration.       Objective:   Physical Exam  Nursing note and vitals reviewed. Constitutional: She is oriented to person, place, and time. She appears well-developed and well-nourished. No distress.  HENT:  Head: Normocephalic and atraumatic.  Right Ear: External ear normal.  Left Ear: External ear normal.  Nose: Nose normal.  Mouth/Throat: Oropharynx is clear and moist.  Eyes: Conjunctivae normal and EOM are normal. Pupils are equal, round, and reactive to light.  Neck: Normal range of motion. Neck supple. No JVD present. No tracheal deviation present. No thyromegaly present.  Cardiovascular: Normal rate, regular rhythm and intact distal pulses.   Murmur heard. Pulmonary/Chest: Effort normal and breath sounds normal. She has no wheezes. She exhibits no tenderness.  Abdominal: Soft. Bowel sounds are normal.  Musculoskeletal: Normal range of motion. She exhibits no edema and no tenderness.  Lymphadenopathy:   She has no cervical adenopathy.  Neurological: She is alert and oriented to person, place, and time. She has normal reflexes. No cranial nerve deficit.  Skin: Skin is warm and dry. She is not diaphoretic.  Psychiatric: She has a normal mood and affect. Her behavior is normal.          Assessment & Plan:  Multifactorial fatigue complicated by history of hypothyroidism mild to moderate depression irritable bowel syndrome and lack of exercise.  We discussed exercise and dietary management of some of her energy issues but I do believe that depression plays a major role.  I believe that she eats inappropriately causing waking which results in further depression.  We discussed counseling referral for dietary consult  Monitor T3 and T4 to assess thyroid function Refer for bone density We will monitor her thyroid T3 and T4 to make sure that your thyroid is working effectively

## 2012-02-20 NOTE — Patient Instructions (Signed)
The patient is instructed to continue all medications as prescribed. Schedule followup with check out clerk upon leaving the clinic  

## 2012-04-07 DIAGNOSIS — H18529 Epithelial (juvenile) corneal dystrophy, unspecified eye: Secondary | ICD-10-CM | POA: Insufficient documentation

## 2012-04-07 DIAGNOSIS — H5210 Myopia, unspecified eye: Secondary | ICD-10-CM | POA: Insufficient documentation

## 2012-04-07 DIAGNOSIS — Z961 Presence of intraocular lens: Secondary | ICD-10-CM | POA: Insufficient documentation

## 2012-04-07 DIAGNOSIS — H02839 Dermatochalasis of unspecified eye, unspecified eyelid: Secondary | ICD-10-CM | POA: Insufficient documentation

## 2012-04-22 DIAGNOSIS — K635 Polyp of colon: Secondary | ICD-10-CM

## 2012-04-22 HISTORY — DX: Polyp of colon: K63.5

## 2012-05-04 ENCOUNTER — Encounter: Payer: Self-pay | Admitting: Internal Medicine

## 2012-05-22 ENCOUNTER — Encounter: Payer: Self-pay | Admitting: Internal Medicine

## 2012-05-22 ENCOUNTER — Ambulatory Visit (INDEPENDENT_AMBULATORY_CARE_PROVIDER_SITE_OTHER): Payer: BC Managed Care – PPO | Admitting: Internal Medicine

## 2012-05-22 VITALS — BP 120/78 | HR 80 | Temp 98.8°F | Wt 217.0 lb

## 2012-05-22 DIAGNOSIS — R5383 Other fatigue: Secondary | ICD-10-CM

## 2012-05-22 DIAGNOSIS — K589 Irritable bowel syndrome without diarrhea: Secondary | ICD-10-CM

## 2012-05-22 DIAGNOSIS — G4733 Obstructive sleep apnea (adult) (pediatric): Secondary | ICD-10-CM

## 2012-05-22 DIAGNOSIS — M300 Polyarteritis nodosa: Secondary | ICD-10-CM

## 2012-05-22 DIAGNOSIS — E039 Hypothyroidism, unspecified: Secondary | ICD-10-CM

## 2012-05-22 NOTE — Progress Notes (Signed)
Subjective:    Patient ID: Jacqueline Jennings, female    DOB: Mar 02, 1951, 62 y.o.   MRN: [id number]  HPI Increased IBS and bloating Hypothyroidism with change to the armour thyroid with some improvement but still feels badly Exercise limited by knee pain Weight loss not accomplished Post prandial bloating helped with acid food? Increased inflammatory pain ( joints)    Review of Systems  Constitutional: Positive for fever. Negative for activity change, appetite change and fatigue.  HENT: Negative for ear pain, congestion, neck pain, postnasal drip and sinus pressure.   Eyes: Negative for redness and visual disturbance.  Respiratory: Negative for cough, shortness of breath and wheezing.   Gastrointestinal: Negative for abdominal pain and abdominal distention.  Genitourinary: Negative for dysuria, frequency and menstrual problem.  Musculoskeletal: Positive for myalgias and arthralgias. Negative for joint swelling.  Skin: Negative for rash and wound.  Neurological: Positive for weakness. Negative for dizziness and headaches.  Hematological: Negative for adenopathy. Does not bruise/bleed easily.  Psychiatric/Behavioral: Negative for sleep disturbance and decreased concentration.   Past Medical History  Diagnosis Date  . Nephrolithiasis     hx of  . IBS (irritable bowel syndrome)   . Hyperlipidemia   . Breast cancer     hx of with rediation, surgery and adjuvant theapy with tamoxifen  (year two)  . Hypothyroidism 09-27-11    Low function  . Sleep apnea 09-27-11    Cpap used ,started 2'2011  . Arthritis 09-27-11    Right knee/Cortisone injection 2'13    History   Social History  . Marital Status: Married    Spouse Name: N/A    Number of Children: N/A  . Years of Education: N/A   Occupational History  . Not on file.   Social History Main Topics  . Smoking status: Never Smoker   . Smokeless tobacco: Not on file  . Alcohol Use: Yes     Comment: rare occ  . Drug Use: No  .  Sexually Active: Yes   Other Topics Concern  . Not on file   Social History Narrative  . No narrative on file    Past Surgical History  Procedure Date  . Tonsillectomy   . Myomectomy 1990  . Kidney stones 1977  . Cataract extraction 09-27-11    Bilateral  . Breast lumpectomy 1977  . Breast lumpectomy 2006    raditon 33 tx-right  . Knee arthroscopy 10/02/2011    Procedure: ARTHROSCOPY KNEE;  Surgeon: Loanne Drilling, MD;  Location: [facility];  Service: Orthopedics;  Laterality: Right;  with Debridement, right knee medial meniscus    Family History  Problem Relation Age of Onset  . Hypertension Mother   . Diabetes Mother   . Cancer Mother     uterine    Allergies  Allergen Reactions  . Nabumetone     REACTION: Rash,Hives  . Latex Rash    Adhesives products-causes reddness, rash    Current Outpatient Prescriptions on File Prior to Visit  Medication Sig Dispense Refill  . Azelaic Acid (FINACEA) 15 % cream Apply topically Nightly. After skin is thoroughly washed and patted dry, gently but thoroughly massage a thin film of azelaic acid cream into the affected area twice daily, in the morning and evening.      . ergocalciferol (VITAMIN D2) 50000 UNITS capsule Take 50,000 Units by mouth once a week. varies      . metroNIDAZOLE (METROGEL) 1 % gel Apply topically daily.      Marland Kitchen  thyroid (ARMOUR) 120 MG tablet Take 1 tablet (120 mg total) by mouth daily with breakfast. (No substitutions)  90 tablet  3    BP 120/78  Pulse 80  Temp 98.8 F (37.1 C) (Oral)  Wt 217 lb (98.431 kg)       Objective:   Physical Exam  Nursing note and vitals reviewed. Constitutional: She appears well-developed and well-nourished.  Neck: Normal range of motion. Neck supple.  Cardiovascular: Normal rate and regular rhythm.   Pulmonary/Chest: Effort normal and breath sounds normal.  Musculoskeletal: She exhibits edema and tenderness.  Psychiatric: She has a normal mood and affect. Her behavior is  normal.          Assessment & Plan:  Asst. continued fatigue and irritable bowel symptomatology.  She states that she feels somewhat better when she takes her vitamin D tablets she has had some a small degree of improvement with this it switched to the armor thyroid.  We will look at T3-T4 TSH and antithyroid antiglobulin.  We will also look at other inflammatory markers to make sure that her musculoskeletal and joint discomfort is not coming from a different etiology such as a polyarthritis cause lupus scleroderma rheumatoid arthritis or arteritis

## 2012-05-22 NOTE — Patient Instructions (Signed)
schedule fasting AM labs for next weeks   No medicastions in the morning of the test you may have water  No coffee.

## 2012-05-26 ENCOUNTER — Other Ambulatory Visit (INDEPENDENT_AMBULATORY_CARE_PROVIDER_SITE_OTHER): Payer: BC Managed Care – PPO

## 2012-05-26 DIAGNOSIS — E039 Hypothyroidism, unspecified: Secondary | ICD-10-CM

## 2012-05-26 LAB — TSH: TSH: 0.1 u[IU]/mL — ABNORMAL LOW (ref 0.35–5.50)

## 2012-05-26 LAB — SEDIMENTATION RATE: Sed Rate: 17 mm/hr (ref 0–22)

## 2012-05-26 LAB — T4, FREE: Free T4: 0.89 ng/dL (ref 0.60–1.60)

## 2012-05-27 LAB — CYCLIC CITRUL PEPTIDE ANTIBODY, IGG: Cyclic Citrullin Peptide Ab: <2 U/mL (ref 0.0–5.0)

## 2012-05-27 LAB — THYROGLOBULIN LEVEL: Thyroglobulin: <0.2 ng/mL (ref 0.0–55.0)

## 2012-06-10 ENCOUNTER — Encounter: Payer: Self-pay | Admitting: Internal Medicine

## 2012-07-29 ENCOUNTER — Encounter: Payer: Self-pay | Admitting: Gastroenterology

## 2012-08-05 ENCOUNTER — Other Ambulatory Visit: Payer: Self-pay | Admitting: Obstetrics and Gynecology

## 2012-08-06 ENCOUNTER — Ambulatory Visit (INDEPENDENT_AMBULATORY_CARE_PROVIDER_SITE_OTHER)
Admission: RE | Admit: 2012-08-06 | Discharge: 2012-08-06 | Disposition: A | Discharge status: Home / Self Care | Payer: BC Managed Care – PPO | Source: Ambulatory Visit | Attending: Internal Medicine | Admitting: Internal Medicine

## 2012-08-06 DIAGNOSIS — Z23 Encounter for immunization: Secondary | ICD-10-CM

## 2012-08-06 DIAGNOSIS — E039 Hypothyroidism, unspecified: Secondary | ICD-10-CM

## 2012-08-06 DIAGNOSIS — M810 Age-related osteoporosis without current pathological fracture: Secondary | ICD-10-CM

## 2012-08-19 ENCOUNTER — Ambulatory Visit: Payer: BC Managed Care – PPO | Admitting: Internal Medicine

## 2012-08-20 ENCOUNTER — Encounter: Payer: Self-pay | Admitting: Internal Medicine

## 2012-08-20 ENCOUNTER — Ambulatory Visit (INDEPENDENT_AMBULATORY_CARE_PROVIDER_SITE_OTHER): Payer: BC Managed Care – PPO | Admitting: Internal Medicine

## 2012-08-20 VITALS — BP 140/80 | HR 76 | Temp 98.2°F | Resp 16 | Ht 66.0 in | Wt 214.0 lb

## 2012-08-20 DIAGNOSIS — E039 Hypothyroidism, unspecified: Secondary | ICD-10-CM

## 2012-08-20 DIAGNOSIS — K5900 Constipation, unspecified: Secondary | ICD-10-CM

## 2012-08-20 NOTE — Addendum Note (Signed)
Addended by: Stacie Glaze on: 08/20/2012 01:16 PM   Modules accepted: Orders

## 2012-08-20 NOTE — Progress Notes (Signed)
  Subjective:    Patient ID: Jacqueline Jennings, female    DOB: Mar 12, 1951, 62 y.o.   MRN: [id number]  HPI Weight loss and bone density reveiwed No osteoporosis seen    Review of Systems  Constitutional: Negative for activity change, appetite change and fatigue.  HENT: Negative for ear pain, congestion, neck pain, postnasal drip and sinus pressure.   Eyes: Negative for redness and visual disturbance.  Respiratory: Negative for cough, shortness of breath and wheezing.   Gastrointestinal: Negative for abdominal pain and abdominal distention.  Genitourinary: Negative for dysuria, frequency and menstrual problem.  Musculoskeletal: Negative for myalgias, joint swelling and arthralgias.  Skin: Negative for rash and wound.  Neurological: Negative for dizziness, weakness and headaches.  Hematological: Negative for adenopathy. Does not bruise/bleed easily.  Psychiatric/Behavioral: Negative for sleep disturbance and decreased concentration.       Objective:   Physical Exam  Nursing note and vitals reviewed. Constitutional: She is oriented to person, place, and time. She appears well-developed and well-nourished. No distress.  HENT:  Head: Normocephalic and atraumatic.  Right Ear: External ear normal.  Left Ear: External ear normal.  Nose: Nose normal.  Mouth/Throat: Oropharynx is clear and moist.  Eyes: Conjunctivae and EOM are normal. Pupils are equal, round, and reactive to light.  Neck: Normal range of motion. Neck supple. No JVD present. No tracheal deviation present. No thyromegaly present.  Cardiovascular: Normal rate, regular rhythm, normal heart sounds and intact distal pulses.   No murmur heard. Pulmonary/Chest: Effort normal and breath sounds normal. She has no wheezes. She exhibits no tenderness.  Abdominal: Soft. Bowel sounds are normal.  Musculoskeletal: Normal range of motion. She exhibits no edema and no tenderness.  Lymphadenopathy:    She has no cervical adenopathy.   Neurological: She is alert and oriented to person, place, and time. She has normal reflexes. No cranial nerve deficit.  Skin: Skin is warm and dry. She is not diaphoretic.  Psychiatric: She has a normal mood and affect. Her behavior is normal.          Assessment & Plan:  persistent OA Weight and exercise Knee pain better without exercise Constipation and slow transit

## 2012-08-20 NOTE — Patient Instructions (Addendum)
Book on stretching

## 2012-09-01 ENCOUNTER — Encounter: Payer: Self-pay | Admitting: Gastroenterology

## 2012-09-01 ENCOUNTER — Ambulatory Visit (AMBULATORY_SURGERY_CENTER): Payer: BC Managed Care – PPO | Admitting: *Deleted

## 2012-09-01 VITALS — Ht 71.0 in | Wt 215.6 lb

## 2012-09-01 DIAGNOSIS — Z1211 Encounter for screening for malignant neoplasm of colon: Secondary | ICD-10-CM

## 2012-09-01 MED ORDER — NA SULFATE-K SULFATE-MG SULF 17.5-3.13-1.6 GM/177ML PO SOLN: ORAL | Status: DC

## 2012-09-08 ENCOUNTER — Ambulatory Visit (AMBULATORY_SURGERY_CENTER): Payer: BC Managed Care – PPO | Admitting: Gastroenterology

## 2012-09-08 ENCOUNTER — Encounter: Payer: Self-pay | Admitting: Gastroenterology

## 2012-09-08 VITALS — BP 155/78 | HR 65 | Temp 97.6°F | Resp 26 | Ht 71.0 in | Wt 215.0 lb

## 2012-09-08 DIAGNOSIS — D126 Benign neoplasm of colon, unspecified: Secondary | ICD-10-CM

## 2012-09-08 DIAGNOSIS — Z1211 Encounter for screening for malignant neoplasm of colon: Secondary | ICD-10-CM

## 2012-09-08 MED ORDER — SODIUM CHLORIDE 0.9 % IV SOLN: 500.0000 mL | INTRAVENOUS | Status: DC

## 2012-09-08 NOTE — Progress Notes (Signed)
Called to room to assist during endoscopic procedure.  Patient ID and intended procedure confirmed with present staff. Received instructions for my participation in the procedure from the performing physician.  

## 2012-09-08 NOTE — Patient Instructions (Addendum)

## 2012-09-08 NOTE — Op Note (Signed)
[facility] [address] [place], [address]   COLONOSCOPY PROCEDURE REPORT  PATIENT: Jacqueline, Jennings  MR#: [id number] BIRTHDATE: 02-Oct-1950 , 61  yrs. old GENDER: Female ENDOSCOPIST: Meryl Dare, MD, [facility] PROCEDURE DATE:  09/08/2012 PROCEDURE:   Colonoscopy with biopsy ASA CLASS:   Class II INDICATIONS:average risk screening. MEDICATIONS: MAC sedation, administered by CRNA and propofol (Diprivan) 300mg  IV DESCRIPTION OF PROCEDURE:   After the risks benefits and alternatives of the procedure were thoroughly explained, informed consent was obtained.  A digital rectal exam revealed no abnormalities of the rectum.   The [id number]  endoscope was introduced through the anus and advanced to the cecum, which was identified by both the appendix and ileocecal valve. No adverse events experienced.   The quality of the prep was good, using MoviPrep  The instrument was then slowly withdrawn as the colon was fully examined.  COLON FINDINGS: A sessile polyp measuring 4 mm in size was found in the transverse colon.  A polypectomy was performed with cold forceps.  The resection was complete and the polyp tissue was completely retrieved.   Moderate diverticulosis was noted in the sigmoid colon and descending colon.   The colon was otherwise normal.  There was no diverticulosis, inflammation, polyps or cancers unless previously stated.  Retroflexed views revealed no abnormalities. The time to cecum=1 minutes 50 seconds.  Withdrawal time=10 minutes 00 seconds.  The scope was withdrawn and the procedure completed.  COMPLICATIONS: There were no complications.  ENDOSCOPIC IMPRESSION: 1.   Sessile polyp measuring 4 mm in the transverse colon; polypectomy performed with cold forceps 2.   Moderate diverticulosis was noted in the sigmoid colon and descending colon  RECOMMENDATIONS: 1.  Repeat colonoscopy in 5 years if polyp is adenomatous; otherwise 10 years 2.   High fiber diet with liberal fluid intake.  eSigned:  Meryl Dare, MD, [facility] 09/08/2012 10:29 AM

## 2012-09-09 ENCOUNTER — Telehealth: Payer: Self-pay | Admitting: *Deleted

## 2012-09-09 NOTE — Telephone Encounter (Signed)
  Follow up Call-  Call back number 09/08/2012  Post procedure Call Back phone  # [phone]  Permission to leave phone message Yes     Patient questions:  Do you have a fever, pain , or abdominal swelling? no Pain Score  0 *  Have you tolerated food without any problems? yes  Have you been able to return to your normal activities? yes  Do you have any questions about your discharge instructions: Diet   no Medications  yes Follow up visit  no  Do you have questions or concerns about your Care? no  Actions: * If pain score is 4 or above: No action needed, pain <4.

## 2012-09-15 ENCOUNTER — Encounter: Payer: Self-pay | Admitting: Internal Medicine

## 2012-09-15 ENCOUNTER — Encounter: Payer: Self-pay | Admitting: Gastroenterology

## 2012-09-15 DIAGNOSIS — E039 Hypothyroidism, unspecified: Secondary | ICD-10-CM

## 2012-09-15 MED ORDER — THYROID 120 MG PO TABS: 120.0000 mg | ORAL_TABLET | Freq: Every day | ORAL | Status: DC

## 2012-09-22 ENCOUNTER — Other Ambulatory Visit (INDEPENDENT_AMBULATORY_CARE_PROVIDER_SITE_OTHER): Payer: BC Managed Care – PPO

## 2012-09-22 ENCOUNTER — Ambulatory Visit (INDEPENDENT_AMBULATORY_CARE_PROVIDER_SITE_OTHER): Payer: BC Managed Care – PPO | Admitting: Internal Medicine

## 2012-09-22 ENCOUNTER — Encounter: Payer: Self-pay | Admitting: Internal Medicine

## 2012-09-22 VITALS — BP 152/98 | HR 73 | Temp 97.0°F | Ht 71.0 in | Wt 217.0 lb

## 2012-09-22 DIAGNOSIS — R300 Dysuria: Secondary | ICD-10-CM

## 2012-09-22 DIAGNOSIS — N390 Urinary tract infection, site not specified: Secondary | ICD-10-CM

## 2012-09-22 LAB — URINALYSIS, ROUTINE W REFLEX MICROSCOPIC
Ketones, ur: NEGATIVE
Total Protein, Urine: NEGATIVE
Urine Glucose: NEGATIVE
Urobilinogen, UA: 0.2 (ref 0.0–1.0)

## 2012-09-22 LAB — POCT URINALYSIS DIPSTICK
Nitrite, UA: NEGATIVE
Protein, UA: NEGATIVE
Urobilinogen, UA: 0.2

## 2012-09-22 MED ORDER — NITROFURANTOIN MONOHYD MACRO 100 MG PO CAPS: 100.0000 mg | ORAL_CAPSULE | Freq: Two times a day (BID) | ORAL | Status: DC

## 2012-09-22 NOTE — Patient Instructions (Signed)
Urinary Tract Infection  Urinary tract infections (UTIs) can develop anywhere along your urinary tract. Your urinary tract is your body's drainage system for removing wastes and extra water. Your urinary tract includes two kidneys, two ureters, a bladder, and a urethra. Your kidneys are a pair of bean-shaped organs. Each kidney is about the size of your fist. They are located below your ribs, one on each side of your spine.  CAUSES  Infections are caused by microbes, which are microscopic organisms, including fungi, viruses, and bacteria. These organisms are so small that they can only be seen through a microscope. Bacteria are the microbes that most commonly cause UTIs.  SYMPTOMS   Symptoms of UTIs may vary by age and gender of the patient and by the location of the infection. Symptoms in young women typically include a frequent and intense urge to urinate and a painful, burning feeling in the bladder or urethra during urination. Older women and men are more likely to be tired, shaky, and weak and have muscle aches and abdominal pain. A fever may mean the infection is in your kidneys. Other symptoms of a kidney infection include pain in your back or sides below the ribs, nausea, and vomiting.  DIAGNOSIS  To diagnose a UTI, your caregiver will ask you about your symptoms. Your caregiver also will ask to provide a urine sample. The urine sample will be tested for bacteria and white blood cells. White blood cells are made by your body to help fight infection.  TREATMENT   Typically, UTIs can be treated with medication. Because most UTIs are caused by a bacterial infection, they usually can be treated with the use of antibiotics. The choice of antibiotic and length of treatment depend on your symptoms and the type of bacteria causing your infection.  HOME CARE INSTRUCTIONS   If you were prescribed antibiotics, take them exactly as your caregiver instructs you. Finish the medication even if you feel better after you  have only taken some of the medication.   Drink enough water and fluids to keep your urine clear or pale yellow.   Avoid caffeine, tea, and carbonated beverages. They tend to irritate your bladder.   Empty your bladder often. Avoid holding urine for long periods of time.   Empty your bladder before and after sexual intercourse.   After a bowel movement, women should cleanse from front to back. Use each tissue only once.  SEEK MEDICAL CARE IF:    You have back pain.   You develop a fever.   Your symptoms do not begin to resolve within 3 days.  SEEK IMMEDIATE MEDICAL CARE IF:    You have severe back pain or lower abdominal pain.   You develop chills.   You have nausea or vomiting.   You have continued burning or discomfort with urination.  MAKE SURE YOU:    Understand these instructions.   Will watch your condition.   Will get help right away if you are not doing well or get worse.  Document Released: 01/16/2005 Document Revised: 10/08/2011 Document Reviewed: 05/17/2011  [employer] Patient Information 2014 [employer].

## 2012-09-22 NOTE — Progress Notes (Signed)
HPI  Pt presents to the clinic today with c/o dysuria and a burning sensation with urination. This started at 5 am this morning. She denies fever, chills or body aches. She has had UTI's in the past. She reports this feels the same. She has not taken anything OTC but has increased her fluid intake.   Review of Systems  Past Medical History  Diagnosis Date  . Nephrolithiasis     hx of  . IBS (irritable bowel syndrome)   . Hyperlipidemia   . Breast cancer     hx of with rediation, surgery and adjuvant theapy with tamoxifen  (year two)  . Hypothyroidism 09-27-11    Low function  . Sleep apnea 09-27-11    Cpap used ,started 2'2011  . Arthritis 09-27-11    Right knee/Cortisone injection 2'13    Family History  Problem Relation Age of Onset  . Hypertension Mother   . Diabetes Mother   . Cancer Mother     uterine  . Colon cancer Neg Hx     History   Social History  . Marital Status: Married    Spouse Name: N/A    Number of Children: N/A  . Years of Education: N/A   Occupational History  . Not on file.   Social History Main Topics  . Smoking status: Never Smoker   . Smokeless tobacco: Never Used  . Alcohol Use: Yes     Comment: rare occ  . Drug Use: No  . Sexually Active: Yes   Other Topics Concern  . Not on file   Social History Narrative  . No narrative on file    Allergies  Allergen Reactions  . Nabumetone     REACTION: Rash,Hives  . Latex Rash    Adhesives products-causes reddness, rash    Constitutional: Denies fever, malaise, fatigue, headache or abrupt weight changes.   GU: Pt reports pain with urination and burning sensation. Denies frequency, urgency, blood in urine, odor or discharge. Skin: Denies redness, rashes, lesions or ulcercations.   No other specific complaints in a complete review of systems (except as listed in HPI above).    Objective:   Physical Exam  BP 152/98  Pulse 73  Temp(Src) 97 F (36.1 C) (Oral)  Ht 5\' 11"  (1.803 m)  Wt  217 lb (98.431 kg)  BMI 30.28 kg/m2  SpO2 97% Wt Readings from Last 3 Encounters:  09/22/12 217 lb (98.431 kg)  09/08/12 215 lb (97.523 kg)  09/01/12 215 lb 9.6 oz (97.796 kg)    General: Appears her stated age, well developed, well nourished in NAD. Cardiovascular: Normal rate and rhythm. S1,S2 noted.  No murmur, rubs or gallops noted. No JVD or BLE edema. No carotid bruits noted. Pulmonary/Chest: Normal effort and positive vesicular breath sounds. No respiratory distress. No wheezes, rales or ronchi noted.  Abdomen: Soft and nontender. Normal bowel sounds, no bruits noted. No distention or masses noted. Liver, spleen and kidneys non palpable. Tender to palpation over the bladder area. No CVA tenderness.      Assessment & Plan:   Dysuria, secondary to   eRx sent if for Macrobid 100 mg BID x 5 days eRx sent in for Pyridium 200 mg TID prn Drink plenty of fluids  RTC as needed or if symptoms persist.

## 2012-09-22 NOTE — Addendum Note (Signed)
Addended by: Carin Primrose on: 09/22/2012 09:41 AM   Modules accepted: Orders

## 2012-09-24 ENCOUNTER — Ambulatory Visit: Payer: BC Managed Care – PPO | Admitting: Family Medicine

## 2012-09-24 LAB — URINE CULTURE
Colony Count: NO GROWTH
Organism ID, Bacteria: NO GROWTH

## 2012-10-01 ENCOUNTER — Ambulatory Visit (INDEPENDENT_AMBULATORY_CARE_PROVIDER_SITE_OTHER): Payer: BC Managed Care – PPO | Admitting: Family Medicine

## 2012-10-01 VITALS — Ht 66.0 in | Wt 214.0 lb

## 2012-10-01 DIAGNOSIS — E669 Obesity, unspecified: Secondary | ICD-10-CM

## 2012-10-01 NOTE — Assessment & Plan Note (Addendum)
She has gained weight over the past several years after menopause.  She eats infrequently during the day so we discussed eating more regularly, meals and snacks.  Increase exercise (walking) as tolerated. Follow-up prn.  See AVS for more details.

## 2012-10-01 NOTE — Patient Instructions (Addendum)
-  Focus on 3 meals and 2 snacks (do not go for more than 4-5 hours without eating)   -Will help control appetite and prevents overeating   -Helps metabolism stay up    -Meal   -1/2 plate vegetables   -1/4 plate protein   -1/4 plate starch  -Try adding more vegetables to your meals  -Try eating more protein: every meal and maybe snacks (for your weight, 55-60 g a day)   -Snacks:    -Yogurt   -Fruit   -(Unsalted) nuts   -Exercise: try walking more

## 2012-10-01 NOTE — Progress Notes (Signed)
  Subjective:    Patient ID: Jacqueline Jennings, female    DOB: 1950/12/27, 62 y.o.   MRN: [id number]  HPI Initial nutrition visit Goals: losing weight   She would like to gain insight into strategies to eat smaller meals that can sustain her during day    She feels like she gained weight after menopause   Healthy weight for patient: 150 lb    She weighed this 20 years ago   Modalities tried:  -Weight Watchers   Eating pattern: 2 meals, 2 snacks   Breakfast, late lunch (heaviest), nighttime snack   24-hr recall:  (UP at 6:15 AM) B (7:30 AM)-2 scrambled eggs, 1 slice of sourdough toast, 1 T butter, 2 glasses of hot black tea (plain) S: water L (1:30 PM)-2 chicken skewers, 2 C salad (lettuce, peppers, cucumbers; feta; 1.5 T vinaigrette), 2 glasses sweet tea    Snk (8 PM)-1/4 C plain [place] yogurt, 1 peach; water   Typical: yes for the past few months   Physical activity:  2-3 days a week, for an hour at a time; elliptical/bike, strengthening machines   Knee surgery has impeded going to gym consistently   Review of Systems  Allergies, medication, past medical history reviewed.  Smoking status noted. OSA HLD HTN    Objective:   Physical Exam GEN: NAD; obese PSYCH: appropriate to questions; pleasant    Assessment & Plan:  >25 minutes spent counseling and educating patient.

## 2012-10-30 ENCOUNTER — Encounter: Payer: Self-pay | Admitting: Internal Medicine

## 2012-11-19 ENCOUNTER — Encounter: Payer: Self-pay | Admitting: Internal Medicine

## 2012-11-19 DIAGNOSIS — R110 Nausea: Secondary | ICD-10-CM

## 2012-11-19 DIAGNOSIS — N390 Urinary tract infection, site not specified: Secondary | ICD-10-CM

## 2012-11-20 MED ORDER — CIPROFLOXACIN HCL 500 MG PO TABS: 500.0000 mg | ORAL_TABLET | Freq: Two times a day (BID) | ORAL | Status: DC

## 2012-11-20 MED ORDER — PROMETHAZINE HCL 25 MG PO TABS: 25.0000 mg | ORAL_TABLET | Freq: Three times a day (TID) | ORAL | Status: DC | PRN

## 2013-01-19 DIAGNOSIS — H26499 Other secondary cataract, unspecified eye: Secondary | ICD-10-CM | POA: Insufficient documentation

## 2013-02-01 ENCOUNTER — Other Ambulatory Visit (INDEPENDENT_AMBULATORY_CARE_PROVIDER_SITE_OTHER): Payer: BC Managed Care – PPO

## 2013-02-01 DIAGNOSIS — Z0000 Encounter for general adult medical examination without abnormal findings: Secondary | ICD-10-CM

## 2013-02-01 LAB — CBC WITH DIFFERENTIAL/PLATELET
Basophils Relative: 0.4 % (ref 0.0–3.0)
Eosinophils Relative: 2.9 % (ref 0.0–5.0)
Hemoglobin: 14.1 g/dL (ref 12.0–15.0)
Lymphocytes Relative: 26.8 % (ref 12.0–46.0)
Neutro Abs: 3.6 10*3/uL (ref 1.4–7.7)
Neutrophils Relative %: 60.6 % (ref 43.0–77.0)
RBC: 5.1 Mil/uL (ref 3.87–5.11)
WBC: 6 10*3/uL (ref 4.5–10.5)

## 2013-02-01 LAB — LIPID PANEL
Cholesterol: 199 mg/dL (ref 0–200)
LDL Cholesterol: 123 mg/dL — ABNORMAL HIGH (ref 0–99)
Total CHOL/HDL Ratio: 4
VLDL: 22.6 mg/dL (ref 0.0–40.0)

## 2013-02-01 LAB — POCT URINALYSIS DIPSTICK
Blood, UA: NEGATIVE
Ketones, UA: NEGATIVE
Protein, UA: NEGATIVE
Spec Grav, UA: 1.02
pH, UA: 6

## 2013-02-01 LAB — BASIC METABOLIC PANEL
Calcium: 9.3 mg/dL (ref 8.4–10.5)
Chloride: 105 mEq/L (ref 96–112)
Creatinine, Ser: 0.6 mg/dL (ref 0.4–1.2)
Sodium: 143 mEq/L (ref 135–145)

## 2013-02-01 LAB — HEPATIC FUNCTION PANEL
ALT: 21 U/L (ref 0–35)
AST: 17 U/L (ref 0–37)
Alkaline Phosphatase: 62 U/L (ref 39–117)
Bilirubin, Direct: 0 mg/dL (ref 0.0–0.3)
Total Protein: 6.8 g/dL (ref 6.0–8.3)

## 2013-02-02 ENCOUNTER — Other Ambulatory Visit: Payer: Self-pay | Admitting: Obstetrics and Gynecology

## 2013-02-08 ENCOUNTER — Ambulatory Visit (INDEPENDENT_AMBULATORY_CARE_PROVIDER_SITE_OTHER): Payer: BC Managed Care – PPO | Admitting: Internal Medicine

## 2013-02-08 ENCOUNTER — Encounter: Payer: Self-pay | Admitting: Internal Medicine

## 2013-02-08 VITALS — BP 140/82 | HR 80 | Temp 98.2°F | Resp 16 | Ht 66.0 in | Wt 214.0 lb

## 2013-02-08 DIAGNOSIS — N852 Hypertrophy of uterus: Secondary | ICD-10-CM

## 2013-02-08 DIAGNOSIS — Z0000 Encounter for general adult medical examination without abnormal findings: Secondary | ICD-10-CM

## 2013-02-08 DIAGNOSIS — Z23 Encounter for immunization: Secondary | ICD-10-CM

## 2013-02-08 LAB — FOLLICLE STIMULATING HORMONE: FSH: 42.2 m[IU]/mL

## 2013-02-08 NOTE — Progress Notes (Signed)
  Subjective:    Patient ID: Jacqueline Jennings, female    DOB: 1951-04-03, 62 y.o.   MRN: [id number]  HPI  CPX Endometrial thickening  After  Hormonal therapy  Hypothyroidism    Review of Systems  Constitutional: Negative for activity change, appetite change and fatigue.  HENT: Negative for congestion, ear pain, postnasal drip and sinus pressure.   Eyes: Negative for redness and visual disturbance.  Respiratory: Negative for cough, shortness of breath and wheezing.   Gastrointestinal: Negative for abdominal pain and abdominal distention.  Genitourinary: Negative for dysuria, frequency and menstrual problem.  Musculoskeletal: Negative for arthralgias, joint swelling, myalgias and neck pain.  Skin: Negative for rash and wound.  Neurological: Negative for dizziness, weakness and headaches.  Hematological: Negative for adenopathy. Does not bruise/bleed easily.  Psychiatric/Behavioral: Negative for sleep disturbance and decreased concentration.       Objective:   Physical Exam  Constitutional: She is oriented to person, place, and time. She appears well-developed and well-nourished. No distress.  HENT:  Head: Normocephalic and atraumatic.  Eyes: Conjunctivae and EOM are normal. Pupils are equal, round, and reactive to light.  Neck: Normal range of motion. Neck supple. No JVD present. No tracheal deviation present. No thyromegaly present.  Cardiovascular: Normal rate, regular rhythm and normal heart sounds.   No murmur heard. Pulmonary/Chest: Effort normal and breath sounds normal. She has no wheezes. She exhibits no tenderness.  Abdominal: Soft. Bowel sounds are normal.  Musculoskeletal: Normal range of motion. She exhibits no edema and no tenderness.  Lymphadenopathy:    She has no cervical adenopathy.  Neurological: She is alert and oriented to person, place, and time. She has normal reflexes. No cranial nerve deficit.  Skin: Skin is warm and dry. She is not diaphoretic.   Psychiatric: She has a normal mood and affect. Her behavior is normal.          Assessment & Plan:   This is a routine physical examination for this healthy  Female. Reviewed all health maintenance protocols including mammography colonoscopy bone density and reviewed appropriate screening labs. Her immunization history was reviewed as well as her current medications and allergies refills of her chronic medications were given and the plan for yearly health maintenance was discussed all orders and referrals were made as appropriate.  Diet and weight therapy

## 2013-02-08 NOTE — Patient Instructions (Signed)
The patient is instructed to continue all medications as prescribed. Schedule followup with check out clerk upon leaving the clinic  

## 2013-02-11 LAB — ESTROGENS, TOTAL: Estrogen: 98.6 pg/mL

## 2013-05-19 ENCOUNTER — Ambulatory Visit (INDEPENDENT_AMBULATORY_CARE_PROVIDER_SITE_OTHER): Payer: BC Managed Care – PPO | Admitting: Family Medicine

## 2013-05-19 ENCOUNTER — Encounter: Payer: Self-pay | Admitting: Family Medicine

## 2013-05-19 ENCOUNTER — Telehealth: Payer: Self-pay | Admitting: Internal Medicine

## 2013-05-19 VITALS — BP 130/78 | HR 83 | Temp 97.7°F | Wt 219.0 lb

## 2013-05-19 DIAGNOSIS — J45909 Unspecified asthma, uncomplicated: Secondary | ICD-10-CM

## 2013-05-19 MED ORDER — METHYLPREDNISOLONE ACETATE 80 MG/ML IJ SUSP
80.0000 mg | Freq: Once | INTRAMUSCULAR | Status: AC
  Administered 2013-05-19: 80 mg via INTRAMUSCULAR

## 2013-05-19 NOTE — Progress Notes (Signed)
   Subjective:    Patient ID: Jacqueline Jennings, female    DOB: [birth year], 63 y.o.   MRN: [id number]  HPI Acute visit Patient is nonsmoker one-week history of cough and chest congestion. She's had some nasal congestion. No fever. No chills. Had some wheezing off and on. No history of asthma. She is using Delsym cough syrup which helps. Denies any significant dyspnea. No nausea or vomiting. No localizing facial pains. She's using saline nasal irrigation for sinus congestion.  Past Medical History  Diagnosis Date  . Nephrolithiasis     hx of  . IBS (irritable bowel syndrome)   . Hyperlipidemia   . Breast cancer     hx of with rediation, surgery and adjuvant theapy with tamoxifen  (year two)  . Hypothyroidism 09-27-11    Low function  . Sleep apnea 09-27-11    Cpap used ,started 2'2011  . Arthritis 09-27-11    Right knee/Cortisone injection 2'13   Past Surgical History  Procedure Laterality Date  . Tonsillectomy    . Myomectomy  1990  . Kidney stones  1977  . Cataract extraction  09-27-11    Bilateral  . Breast biopsy  1977    right  . Breast lumpectomy  2006    raditon 33 tx-right  . Knee arthroscopy  10/02/2011    Procedure: ARTHROSCOPY KNEE;  Surgeon: Gearlean Alf, MD;  Location: [facility];  Service: Orthopedics;  Laterality: Right;  with Debridement, right knee medial meniscus    reports that she has never smoked. She has never used smokeless tobacco. She reports that she drinks alcohol. She reports that she does not use illicit drugs. family history includes Cancer in her mother; Diabetes in her mother; Hypertension in her mother. There is no history of Colon cancer. Allergies  Allergen Reactions  . Nabumetone     REACTION: Rash,Hives  . Latex Rash    Adhesives products-causes reddness, rash      Review of Systems  Constitutional: Positive for fatigue. Negative for fever and chills.  HENT: Positive for congestion.   Respiratory: Positive for cough and wheezing. Negative  for shortness of breath.   Cardiovascular: Negative for chest pain.       Objective:   Physical Exam  Constitutional: She appears well-developed and well-nourished.  HENT:  Right Ear: External ear normal.  Left Ear: External ear normal.  Mouth/Throat: Oropharynx is clear and moist.  Neck: Neck supple.  Cardiovascular: Normal rate and regular rhythm.   Pulmonary/Chest: Effort normal. She has wheezes. She has no rales. She exhibits no tenderness.  Musculoskeletal: She exhibits no edema.  Lymphadenopathy:    She has no cervical adenopathy.          Assessment & Plan:  Asthmatic bronchitis. Depo-Medrol 80 mg IM. Continue over-the-counter cough medication. Followup promptly for any fever. Suspect viral etiology.

## 2013-05-19 NOTE — Telephone Encounter (Signed)
Patient Information:  Caller Name: [place]  Phone: [phone]  Patient: Jacqueline Jennings  Gender: Female  DOB: 10/25/1950  Age: 63 Years  PCP: Benay Pillow (Adults only)  Office Follow Up:  Does the office need to follow up with this patient?: No  Instructions For The Office: N/A   Symptoms  Reason For Call & Symptoms: Pt calling regarding chest congestions and wheezing for about a week. Also has head congestion. Not wheezing at moment; mostly at night.  Reviewed Health History In EMR: Yes  Reviewed Medications In EMR: Yes  Reviewed Allergies In EMR: Yes  Reviewed Surgeries / Procedures: Yes  Date of Onset of Symptoms: 05/13/2013  Treatments Tried: Delsym, Tylenol 2, prn 4-6 hrs prn, Neti pot, steamy  Treatments Tried Worked: Yes  Guideline(s) Used:  Cough  Disposition Per Guideline:   See Today or Tomorrow in Office  Reason For Disposition Reached:   Continuous (nonstop) coughing interferes with work or school and no improvement using cough treatment per Care Advice  Advice Given:  N/A  Patient Will Follow Care Advice:  YES  Appointment Scheduled:  05/19/2013 14:30:00 Appointment Scheduled Provider:  Carolann Littler (Family Practice)

## 2013-05-19 NOTE — Addendum Note (Signed)
Addended by: Marcina Millard on: 05/19/2013 03:02 PM   Modules accepted: Orders

## 2013-05-19 NOTE — Patient Instructions (Signed)
Acute Bronchitis Bronchitis is inflammation of the airways that extend from the windpipe into the lungs (bronchi). The inflammation often causes mucus to develop. This leads to a cough, which is the most common symptom of bronchitis.  In acute bronchitis, the condition usually develops suddenly and goes away over time, usually in a couple weeks. Smoking, allergies, and asthma can make bronchitis worse. Repeated episodes of bronchitis may cause further lung problems.  CAUSES Acute bronchitis is most often caused by the same virus that causes a cold. The virus can spread from person to person (contagious).  SIGNS AND SYMPTOMS   Cough.   Fever.   Coughing up mucus.   Body aches.   Chest congestion.   Chills.   Shortness of breath.   Sore throat.  DIAGNOSIS  Acute bronchitis is usually diagnosed through a physical exam. Tests, such as chest X-rays, are sometimes done to rule out other conditions.  TREATMENT  Acute bronchitis usually goes away in a couple weeks. Often times, no medical treatment is necessary. Medicines are sometimes given for relief of fever or cough. Antibiotics are usually not needed but may be prescribed in certain situations. In some cases, an inhaler may be recommended to help reduce shortness of breath and control the cough. A cool mist vaporizer may also be used to help thin bronchial secretions and make it easier to clear the chest.  HOME CARE INSTRUCTIONS  Get plenty of rest.   Drink enough fluids to keep your urine clear or pale yellow (unless you have a medical condition that requires fluid restriction). Increasing fluids may help thin your secretions and will prevent dehydration.   Only take over-the-counter or prescription medicines as directed by your health care provider.   Avoid smoking and secondhand smoke. Exposure to cigarette smoke or irritating chemicals will make bronchitis worse. If you are a smoker, consider using nicotine gum or skin  patches to help control withdrawal symptoms. Quitting smoking will help your lungs heal faster.   Reduce the chances of another bout of acute bronchitis by washing your hands frequently, avoiding people with cold symptoms, and trying not to touch your hands to your mouth, nose, or eyes.   Follow up with your health care provider as directed.  SEEK MEDICAL CARE IF: Your symptoms do not improve after 1 week of treatment.  SEEK IMMEDIATE MEDICAL CARE IF:  You develop an increased fever or chills.   You have chest pain.   You have severe shortness of breath.  You have bloody sputum.   You develop dehydration.  You develop fainting.  You develop repeated vomiting.  You develop a severe headache. MAKE SURE YOU:   Understand these instructions.  Will watch your condition.  Will get help right away if you are not doing well or get worse. Document Released: 05/16/2004 Document Revised: 12/09/2012 Document Reviewed: 09/29/2012 [employer] Patient Information 2014 [employer].  

## 2013-05-19 NOTE — Progress Notes (Signed)
Pre visit review using our clinic review tool, if applicable. No additional management support is needed unless otherwise documented below in the visit note. 

## 2013-07-09 ENCOUNTER — Ambulatory Visit (INDEPENDENT_AMBULATORY_CARE_PROVIDER_SITE_OTHER): Payer: BC Managed Care – PPO | Admitting: Family Medicine

## 2013-07-09 ENCOUNTER — Telehealth: Payer: Self-pay | Admitting: Internal Medicine

## 2013-07-09 ENCOUNTER — Encounter: Payer: Self-pay | Admitting: Family Medicine

## 2013-07-09 VITALS — BP 128/80 | HR 87 | Temp 97.9°F | Wt 214.0 lb

## 2013-07-09 DIAGNOSIS — R0982 Postnasal drip: Secondary | ICD-10-CM

## 2013-07-09 DIAGNOSIS — R05 Cough: Secondary | ICD-10-CM

## 2013-07-09 DIAGNOSIS — R059 Cough, unspecified: Secondary | ICD-10-CM

## 2013-07-09 NOTE — Patient Instructions (Signed)
Try over the counter nasal spray such as Flonase or Nasacort Try night time use of OTC chlorpheniramine or brompheniramine for postnasal drip symptoms. Follow up with Dr Arnoldo Morale if not better in 1-2 weeks.

## 2013-07-09 NOTE — Progress Notes (Signed)
   Subjective:    Patient ID: Jacqueline Jennings, female    DOB: 10/02/1950, 63 y.o.   MRN: [id number]  Cough Associated symptoms include postnasal drip. Pertinent negatives include no chest pain, chills, fever, headaches, sore throat, shortness of breath or wheezing.   Patient seen with recurrent cough. She had episode back in January which we felt was likely viral and she had reactive airway component was given steroids and did see some improvement briefly. She now presents with recurrence of postnasal drip for the past several weeks and mostly dry cough. No fever. No chills. No dyspnea. No history of known allergies. She has not taken anything for postnasal drip symptoms. These are daily. No facial pain. No headaches. No purulent nasal discharge. She denies GERD symptoms.  No appetite or weight changes. Nonsmoker. No history of asthma.  Past Medical History  Diagnosis Date  . Nephrolithiasis     hx of  . IBS (irritable bowel syndrome)   . Hyperlipidemia   . Breast cancer     hx of with rediation, surgery and adjuvant theapy with tamoxifen  (year two)  . Hypothyroidism 09-27-11    Low function  . Sleep apnea 09-27-11    Cpap used ,started 2'2011  . Arthritis 09-27-11    Right knee/Cortisone injection 2'13   Past Surgical History  Procedure Laterality Date  . Tonsillectomy    . Myomectomy  1990  . Kidney stones  1977  . Cataract extraction  09-27-11    Bilateral  . Breast biopsy  1977    right  . Breast lumpectomy  2006    raditon 33 tx-right  . Knee arthroscopy  10/02/2011    Procedure: ARTHROSCOPY KNEE;  Surgeon: Gearlean Alf, MD;  Location: [facility];  Service: Orthopedics;  Laterality: Right;  with Debridement, right knee medial meniscus    reports that she has never smoked. She has never used smokeless tobacco. She reports that she drinks alcohol. She reports that she does not use illicit drugs. family history includes Cancer in her mother; Diabetes in her mother; Hypertension in  her mother. There is no history of Colon cancer. Allergies  Allergen Reactions  . Nabumetone     REACTION: Rash,Hives  . Latex Rash    Adhesives products-causes reddness, rash      Review of Systems  Constitutional: Negative for fever and chills.  HENT: Positive for postnasal drip. Negative for congestion, sore throat and voice change.   Respiratory: Positive for cough. Negative for shortness of breath and wheezing.   Cardiovascular: Negative for chest pain.  Neurological: Negative for headaches.       Objective:   Physical Exam  Constitutional: She appears well-developed and well-nourished.  HENT:  Right Ear: External ear normal.  Left Ear: External ear normal.  Nose: Nose normal.  Mouth/Throat: Oropharynx is clear and moist.  Neck: Neck supple.  Cardiovascular: Normal rate and regular rhythm.   Pulmonary/Chest: Effort normal and breath sounds normal. No respiratory distress. She has no wheezes. She has no rales.  Lymphadenopathy:    She has no cervical adenopathy.          Assessment & Plan:  Chronic/recurrent cough. She is describing postnasal drip symptoms without history of known chronic allergies. Nonfocal exam. No evidence for active infection. Try over-the-counter chlorpheniramine or brompheniramine at night. Try over-the-counter Flonase or Nasacort once daily. Followup with primary in 2 weeks if cough not improving.  Consider trial of PPI if not improving in 2-3 weeks

## 2013-07-09 NOTE — Telephone Encounter (Signed)
Patient Information:  Caller Name: Malicia  Phone: [phone]  Patient: Jacqueline Jennings, Jacqueline Jennings  Gender: Female  DOB: 1950-06-27  Age: 63 Years  PCP: Carolann Littler (Family Practice)  Office Follow Up:  Does the office need to follow up with this patient?: No  Instructions For The Office: N/A   Symptoms  Reason For Call & Symptoms: Was sick with Bronchitis at the end of Jan 2015 and given steroid injection in the office and slowly got better over 3 weeks. She has continued with Post nasal Drip and started back with coughing yesterday-07/08/13. Today chest is feeling tight but not wheezing- cough productive (unsure if colored sputum). Afebrile. She is concerned because it is taking so long to recover.   Reviewed Health History In EMR: Yes  Reviewed Medications In EMR: Yes  Reviewed Allergies In EMR: N/A  Reviewed Surgeries / Procedures: Yes  Date of Onset of Symptoms: 07/08/2013  Guideline(s) Used:  Cough  Disposition Per Guideline:   See Today or Tomorrow in Office  Reason For Disposition Reached:   Patient wants to be seen  Advice Given:  Reassurance  Coughing is the way that our lungs remove irritants and mucus. It helps protect our lungs from getting pneumonia.  You can get a dry hacking cough after a chest cold. Sometimes this type of cough can last 1-3 weeks, and be worse at night.  You can also get a cough after being exposed to irritating substances like smoke, strong perfumes, and dust.  Coughing Spasms:  Drink warm fluids. Inhale warm mist (Reason: both relax the airway and loosen up the phlegm).  Suck on cough drops or hard candy to coat the irritated throat.  Prevent Dehydration:  Drink adequate liquids.  This will help soothe an irritated or dry throat and loosen up the phlegm.  Expected Course:   The expected course depends on what is causing the cough.  Viral bronchitis (chest cold) causes a cough that lasts 1 to 3 weeks. Sometimes you may cough up lots of phlegm  (sputum, mucus). The mucus can normally be white, gray, yellow, or green.  Call Back If:  Difficulty breathing  Cough lasts more than 3 weeks  Fever lasts > 3 days  You become worse.  Patient Will Follow Care Advice:  YES  Appointment Scheduled:  07/09/2013 13:30:00 Appointment Scheduled Provider:  Carolann Littler [facility])

## 2013-07-09 NOTE — Progress Notes (Signed)
Pre visit review using our clinic review tool, if applicable. No additional management support is needed unless otherwise documented below in the visit note. 

## 2013-07-11 ENCOUNTER — Encounter: Payer: Self-pay | Admitting: Internal Medicine

## 2013-07-12 ENCOUNTER — Other Ambulatory Visit: Payer: Self-pay | Admitting: Internal Medicine

## 2013-07-12 DIAGNOSIS — J45909 Unspecified asthma, uncomplicated: Secondary | ICD-10-CM

## 2013-07-12 MED ORDER — METHYLPREDNISOLONE (PAK) 4 MG PO TABS: ORAL_TABLET | ORAL | Status: DC

## 2013-07-12 MED ORDER — LEVOFLOXACIN 500 MG PO TABS: 500.0000 mg | ORAL_TABLET | Freq: Every day | ORAL | Status: DC

## 2013-07-15 ENCOUNTER — Encounter: Payer: Self-pay | Admitting: Internal Medicine

## 2013-07-15 DIAGNOSIS — R05 Cough: Secondary | ICD-10-CM

## 2013-07-15 DIAGNOSIS — R053 Chronic cough: Secondary | ICD-10-CM

## 2013-07-19 ENCOUNTER — Ambulatory Visit (INDEPENDENT_AMBULATORY_CARE_PROVIDER_SITE_OTHER)
Admission: RE | Admit: 2013-07-19 | Discharge: 2013-07-19 | Disposition: A | Discharge status: Home / Self Care | Payer: BC Managed Care – PPO | Source: Ambulatory Visit | Attending: Internal Medicine | Admitting: Internal Medicine

## 2013-07-19 DIAGNOSIS — R05 Cough: Secondary | ICD-10-CM

## 2013-07-19 DIAGNOSIS — R053 Chronic cough: Secondary | ICD-10-CM

## 2013-07-19 DIAGNOSIS — R059 Cough, unspecified: Secondary | ICD-10-CM

## 2013-07-21 NOTE — Addendum Note (Signed)
Addended by: Ricard Dillon on: 07/21/2013 12:06 PM   Modules accepted: Orders

## 2013-07-22 ENCOUNTER — Encounter: Payer: Self-pay | Admitting: Internal Medicine

## 2013-07-26 ENCOUNTER — Telehealth: Payer: Self-pay | Admitting: Internal Medicine

## 2013-07-26 NOTE — Addendum Note (Signed)
Addended by: Ricard Dillon on: 07/26/2013 08:41 AM   Modules accepted: Orders

## 2013-07-26 NOTE — Telephone Encounter (Signed)
Spoke with Neoma Laming. She reports Dr. Arnoldo Morale wants pt to be seen this week by one of our docs for chronic cough x several weeks. No openings any this week with any doc. Pt has pending consult appt with [facility] on 08/30/13 but Dr. Arnoldo Morale wants pt to be seen sooner. Will ask Dr. Annamaria Boots if pt can be worked in sooner. Please advise thanks

## 2013-07-26 NOTE — Telephone Encounter (Signed)
Spoke with Magda Paganini about this patient and okay to have patient come in on Friday 07-30-13 at 10:00am pt to be here at 9:45am to check in. I called Debra at Dr Arnoldo Morale office and made her aware of consult time and date. I called the patient and got the husband-pt is in jury duty today and will need to call us back to get date and time of her consult with MW for chronic cough. We will need to cancel her consult on 08-20-13 with CY and move it to [facility] schedule this Friday.

## 2013-07-27 NOTE — Telephone Encounter (Signed)
Pt returned call.  Pt is refusing to see anyone other than CY.  Pt states Dr. Arnoldo Morale only wants her to see CY & is being referred for allergies not just a chronic cough as indicated on the referral in EPIC.  Please call pt back regarding an appt.  Jacqueline Jennings

## 2013-07-27 NOTE — Telephone Encounter (Signed)
LMTCB

## 2013-07-28 NOTE — Telephone Encounter (Signed)
I spoke with the pt and advised that we can get her in sooner with another MD. Pt refused stating Dr. Arnoldo Morale told her to see Dr. Annamaria Boots only, however there is a referral placed by Dr. Arnoldo Morale that the pt could see any doctor due to [facility] schedule being so far out. I advised the pt of this but she refused and states she will keep the May appt with CY and will speak with Dr. Arnoldo Morale office. I advised that if she decides to change to a sooner appt then we would be happy to work with her on this. [place] Bing, CMA

## 2013-08-09 ENCOUNTER — Encounter: Payer: Self-pay | Admitting: Internal Medicine

## 2013-08-09 ENCOUNTER — Ambulatory Visit (INDEPENDENT_AMBULATORY_CARE_PROVIDER_SITE_OTHER): Payer: BC Managed Care – PPO | Admitting: Internal Medicine

## 2013-08-09 VITALS — BP 118/74 | HR 84 | Temp 97.6°F | Ht 66.0 in | Wt 215.0 lb

## 2013-08-09 DIAGNOSIS — R05 Cough: Secondary | ICD-10-CM

## 2013-08-09 DIAGNOSIS — J309 Allergic rhinitis, unspecified: Secondary | ICD-10-CM

## 2013-08-09 DIAGNOSIS — R059 Cough, unspecified: Secondary | ICD-10-CM

## 2013-08-09 NOTE — Patient Instructions (Signed)
The patient is instructed to continue all medications as prescribed. Schedule followup with check out clerk upon leaving the clinic  

## 2013-08-09 NOTE — Progress Notes (Signed)
Subjective:    Patient ID: Jacqueline Jennings, female    DOB: [birth year], 63 y.o.   MRN: [id number]  HPI Allergist may 11 with Dr Annamaria Boots for allergies and cough ( severe bronchitis)  Feels frustrated with care difficult to see me.   Review of Systems  Constitutional: Negative for activity change, appetite change and fatigue.  HENT: Negative for congestion, ear pain, postnasal drip and sinus pressure.   Eyes: Negative for redness and visual disturbance.  Respiratory: Negative for cough, shortness of breath and wheezing.   Gastrointestinal: Negative for abdominal pain and abdominal distention.  Genitourinary: Negative for dysuria, frequency and menstrual problem.  Musculoskeletal: Negative for arthralgias, joint swelling, myalgias and neck pain.  Skin: Negative for rash and wound.  Neurological: Negative for dizziness, weakness and headaches.  Hematological: Negative for adenopathy. Does not bruise/bleed easily.  Psychiatric/Behavioral: Negative for sleep disturbance and decreased concentration.       Objective:   Physical Exam  Nursing note and vitals reviewed. Constitutional: She is oriented to person, place, and time. She appears well-developed and well-nourished. No distress.  HENT:  Head: Normocephalic and atraumatic.  Eyes: Conjunctivae and EOM are normal. Pupils are equal, round, and reactive to light.  Neck: Normal range of motion. Neck supple. No JVD present. No tracheal deviation present. No thyromegaly present.  Cardiovascular: Normal rate and regular rhythm.   No murmur heard. Pulmonary/Chest: Effort normal and breath sounds normal. She has no wheezes. She exhibits no tenderness.  Abdominal: Soft. Bowel sounds are normal.  Musculoskeletal: Normal range of motion. She exhibits no edema and no tenderness.  Lymphadenopathy:    She has no cervical adenopathy.  Neurological: She is alert and oriented to person, place, and time. She has normal reflexes. No cranial nerve  deficit.  Skin: Skin is warm and dry. She is not diaphoretic.  Psychiatric: She has a normal mood and affect. Her behavior is normal.      Past Medical History  Diagnosis Date  . Nephrolithiasis     hx of  . IBS (irritable bowel syndrome)   . Hyperlipidemia   . Breast cancer     hx of with rediation, surgery and adjuvant theapy with tamoxifen  (year two)  . Hypothyroidism 09-27-11    Low function  . Sleep apnea 09-27-11    Cpap used ,started 2'2011  . Arthritis 09-27-11    Right knee/Cortisone injection 2'13    History   Social History  . Marital Status: Married    Spouse Name: N/A    Number of Children: N/A  . Years of Education: N/A   Occupational History  . Not on file.   Social History Main Topics  . Smoking status: Never Smoker   . Smokeless tobacco: Never Used  . Alcohol Use: Yes     Comment: rare occ  . Drug Use: No  . Sexual Activity: Yes   Other Topics Concern  . Not on file   Social History Narrative  . No narrative on file    Past Surgical History  Procedure Laterality Date  . Tonsillectomy    . Myomectomy  1990  . Kidney stones  1977  . Cataract extraction  09-27-11    Bilateral  . Breast biopsy  1977    right  . Breast lumpectomy  2006    raditon 33 tx-right  . Knee arthroscopy  10/02/2011    Procedure: ARTHROSCOPY KNEE;  Surgeon: Gearlean Alf, MD;  Location: [facility];  Service: Orthopedics;  Laterality: Right;  with Debridement, right knee medial meniscus    Family History  Problem Relation Age of Onset  . Hypertension Mother   . Diabetes Mother   . Cancer Mother     uterine  . Colon cancer Neg Hx     Allergies  Allergen Reactions  . Nabumetone     REACTION: Rash,Hives  . Latex Rash    Adhesives products-causes reddness, rash    Current Outpatient Prescriptions on File Prior to Visit  Medication Sig Dispense Refill  . Azelaic Acid (FINACEA) 15 % cream Apply topically Nightly. After skin is thoroughly washed and patted dry,  gently but thoroughly massage a thin film of azelaic acid cream into the affected area twice daily, in the morning and evening.      . thyroid (ARMOUR) 120 MG tablet Take 1 tablet (120 mg total) by mouth daily with breakfast. (No substitutions)  90 tablet  3  . calcium gluconate 500 MG tablet Take 1,000 mg by mouth daily.      . Ergocalciferol (VITAMIN D2 PO) Take 5,000 mg by mouth daily.       No current facility-administered medications on file prior to visit.    BP 118/74  Pulse 84  Temp(Src) 97.6 F (36.4 C) (Oral)  Ht 5\' 6"  (1.676 m)  Wt 215 lb (97.523 kg)  BMI 34.72 kg/m2  SpO2 96%       Assessment & Plan:  Discussion of sick visit care And how we treat triage Cough is better Need to set up follow up with Dr Maudie Mercury for chronic issues  Stable otherwise  Weight loss

## 2013-08-09 NOTE — Progress Notes (Signed)
Pre visit review using our clinic review tool, if applicable. No additional management support is needed unless otherwise documented below in the visit note. 

## 2013-08-30 ENCOUNTER — Institutional Professional Consult (permissible substitution): Payer: BC Managed Care – PPO | Admitting: Internal Medicine

## 2013-09-17 ENCOUNTER — Encounter (HOSPITAL_COMMUNITY): Payer: Self-pay | Admitting: Emergency Medicine

## 2013-09-17 ENCOUNTER — Emergency Department (INDEPENDENT_AMBULATORY_CARE_PROVIDER_SITE_OTHER)
Admission: EM | Admit: 2013-09-17 | Discharge: 2013-09-17 | Disposition: A | Discharge status: Home / Self Care | Payer: BC Managed Care – PPO | Source: Home / Self Care | Attending: Emergency Medicine | Admitting: Emergency Medicine

## 2013-09-17 DIAGNOSIS — W9421XA Exposure to reduction in atmospheric pressure while surfacing from deep-water diving, initial encounter: Secondary | ICD-10-CM

## 2013-09-17 DIAGNOSIS — T7020XA Unspecified effects of high altitude, initial encounter: Secondary | ICD-10-CM

## 2013-09-17 DIAGNOSIS — T7029XA Other effects of high altitude, initial encounter: Secondary | ICD-10-CM

## 2013-09-17 DIAGNOSIS — J329 Chronic sinusitis, unspecified: Secondary | ICD-10-CM

## 2013-09-17 DIAGNOSIS — H6690 Otitis media, unspecified, unspecified ear: Secondary | ICD-10-CM

## 2013-09-17 LAB — POCT RAPID STREP A: Streptococcus, Group A Screen (Direct): NEGATIVE

## 2013-09-17 MED ORDER — DOXYCYCLINE HYCLATE 100 MG PO TABS
ORAL_TABLET | ORAL | Status: AC
  Filled 2013-09-17: qty 2

## 2013-09-17 MED ORDER — PREDNISONE 20 MG PO TABS
40.0000 mg | ORAL_TABLET | Freq: Once | ORAL | Status: AC
  Administered 2013-09-17: 40 mg via ORAL

## 2013-09-17 MED ORDER — DOXYCYCLINE HYCLATE 100 MG PO TABS
200.0000 mg | ORAL_TABLET | Freq: Once | ORAL | Status: AC
  Administered 2013-09-17: 200 mg via ORAL

## 2013-09-17 MED ORDER — PREDNISONE 20 MG PO TABS
ORAL_TABLET | ORAL | Status: AC
  Filled 2013-09-17: qty 2

## 2013-09-17 MED ORDER — DOXYCYCLINE HYCLATE 100 MG PO CAPS: 100.0000 mg | ORAL_CAPSULE | Freq: Two times a day (BID) | ORAL | Status: DC

## 2013-09-17 MED ORDER — FLUTICASONE PROPIONATE 50 MCG/ACT NA SUSP: 2.0000 | Freq: Two times a day (BID) | NASAL | Status: DC

## 2013-09-17 MED ORDER — METHYLPREDNISOLONE 4 MG PO KIT: PACK | ORAL | Status: DC

## 2013-09-17 NOTE — Discharge Instructions (Signed)
Sinusitis Sinusitis is redness, soreness, and swelling (inflammation) of the paranasal sinuses. Paranasal sinuses are air pockets within the bones of your face (beneath the eyes, the middle of the forehead, or above the eyes). In healthy paranasal sinuses, mucus is able to drain out, and air is able to circulate through them by way of your nose. However, when your paranasal sinuses are inflamed, mucus and air can become trapped. This can allow bacteria and other germs to grow and cause infection. Sinusitis can develop quickly and last only a short time (acute) or continue over a long period (chronic). Sinusitis that lasts for more than 12 weeks is considered chronic.  CAUSES  Causes of sinusitis include:  Allergies.  Structural abnormalities, such as displacement of the cartilage that separates your nostrils (deviated septum), which can decrease the air flow through your nose and sinuses and affect sinus drainage.  Functional abnormalities, such as when the small hairs (cilia) that line your sinuses and help remove mucus do not work properly or are not present. SYMPTOMS  Symptoms of acute and chronic sinusitis are the same. The primary symptoms are pain and pressure around the affected sinuses. Other symptoms include:  Upper toothache.  Earache.  Headache.  Bad breath.  Decreased sense of smell and taste.  A cough, which worsens when you are lying flat.  Fatigue.  Fever.  Thick drainage from your nose, which often is green and may contain pus (purulent).  Swelling and warmth over the affected sinuses. DIAGNOSIS  Your caregiver will perform a physical exam. During the exam, your caregiver may:  Look in your nose for signs of abnormal growths in your nostrils (nasal polyps).  Tap over the affected sinus to check for signs of infection.  View the inside of your sinuses (endoscopy) with a special imaging device with a light attached (endoscope), which is inserted into your  sinuses. If your caregiver suspects that you have chronic sinusitis, one or more of the following tests may be recommended:  Allergy tests.  Nasal culture A sample of mucus is taken from your nose and sent to a lab and screened for bacteria.  Nasal cytology A sample of mucus is taken from your nose and examined by your caregiver to determine if your sinusitis is related to an allergy. TREATMENT  Most cases of acute sinusitis are related to a viral infection and will resolve on their own within 10 days. Sometimes medicines are prescribed to help relieve symptoms (pain medicine, decongestants, nasal steroid sprays, or saline sprays).  However, for sinusitis related to a bacterial infection, your caregiver will prescribe antibiotic medicines. These are medicines that will help kill the bacteria causing the infection.  Rarely, sinusitis is caused by a fungal infection. In theses cases, your caregiver will prescribe antifungal medicine. For some cases of chronic sinusitis, surgery is needed. Generally, these are cases in which sinusitis recurs more than 3 times per year, despite other treatments. HOME CARE INSTRUCTIONS   Drink plenty of water. Water helps thin the mucus so your sinuses can drain more easily.  Use a humidifier.  Inhale steam 3 to 4 times a day (for example, sit in the bathroom with the shower running).  Apply a warm, moist washcloth to your face 3 to 4 times a day, or as directed by your caregiver.  Use saline nasal sprays to help moisten and clean your sinuses.  Take over-the-counter or prescription medicines for pain, discomfort, or fever only as directed by your caregiver. [place]  CARE IF:  You have increasing pain or severe headaches.  You have nausea, vomiting, or drowsiness.  You have swelling around your face.  You have vision problems.  You have a stiff neck.  You have difficulty breathing. MAKE SURE YOU:   Understand these  instructions.  Will watch your condition.  Will get help right away if you are not doing well or get worse. Document Released: 04/08/2005 Document Revised: 07/01/2011 Document Reviewed: 04/23/2011 [facility] Patient Information 2014 [place], [place].  Barotitis Media Barotits media is a condition caused by damage of the middle ear due to an increase in pressure, also known as Midwife. The injury may be the result of an inability to equalize the pressure between the environment and the middle ear. The condition affects the many components of the ear including the middle ear, the eustachian tube, and the nerve endings in the ear. Many different activities can cause barotitis media such as deep water diving or change in altitude.  SYMPTOMS   A plugged feeling in the ear.  Muffled hearing.  Loss of hearing.  Ear pain that may radiate to the cheekbone and forehead.  Dizziness.  Ringing in the ears (tinnitus).  Fluid in the ear.  Rupture of the eardrum (tympanic membrane). CAUSES   Sudden change in pressure in an airplane.  Inability to equalize ear pressures.  Rapid ascent or descent with deep water diving.  Coughing or sneezing against pressure.  Trauma to the external or middle ear, in boxing or waterskiing, for example. RISK INCREASES WITH:  Eustachian tube dysfunction.  Upper respiratory infection.  Ear infection (otitis media).  Air travel.  Scuba diving. PREVENTION  Avoid activities that increase your risk of barotitis media if you have a respiratory infection or ear infection.  Perform ear-clearing maneuvers, such as chewing gum or sucking on hard candy.  Decongestants may help maintain eustachian tube function. TREATMENT In some cases medication may be used to treat barotitis media. Decongestants and oral antihistimines may clear excess mucus and maintain eustachian tube function. If the eardrum is ruptured, avoid an activity that may allow water to enter  the ear. On occasion, surgery is necessary to clear excess mucus or repair the ruptured eardrum. If systemic complications of alteration of pressure occur, hospitalization for monitoring and decompression in a pressure chamber (barotherapy), or other treatments may be necessary.  Document Released: 04/08/2005 Document Revised: 07/01/2011 Document Reviewed: 07/21/2008 [facility] Patient Information 2014 [place], [place].

## 2013-09-17 NOTE — ED Provider Notes (Signed)
CSN: [id number]     Arrival date & time 09/17/13  1940 History   First MD Initiated Contact with Patient 09/17/13 2031     Chief Complaint  Patient presents with  . Sore Throat   (Consider location/radiation/quality/duration/timing/severity/associated sxs/prior Treatment) HPI Comments: 63 year old female presents for evaluation of sinus pressure and sore throat. This started a few days ago while she was in [place] and got much worse on the flight back over. He had multiple sick contacts while they were on their trip in [place]. Sinus pressure got acutely worse while she was in the air, and she started having ear pain and popping during the flight. Now her hearing is very muffled. Her symptoms are progressively worsening. She has fatigue and body aches as well. No fever. Over-the-counter medications have not helped  Patient is a 63 y.o. female presenting with pharyngitis.  Sore Throat    Past Medical History  Diagnosis Date  . Nephrolithiasis     hx of  . IBS (irritable bowel syndrome)   . Hyperlipidemia   . Breast cancer     hx of with rediation, surgery and adjuvant theapy with tamoxifen  (year two)  . Hypothyroidism 09-27-11    Low function  . Sleep apnea 09-27-11    Cpap used ,started 2'2011  . Arthritis 09-27-11    Right knee/Cortisone injection 2'13   Past Surgical History  Procedure Laterality Date  . Tonsillectomy    . Myomectomy  1990  . Kidney stones  1977  . Cataract extraction  09-27-11    Bilateral  . Breast biopsy  1977    right  . Breast lumpectomy  2006    raditon 33 tx-right  . Knee arthroscopy  10/02/2011    Procedure: ARTHROSCOPY KNEE;  Surgeon: Gearlean Alf, MD;  Location: [facility];  Service: Orthopedics;  Laterality: Right;  with Debridement, right knee medial meniscus   Family History  Problem Relation Age of Onset  . Hypertension Mother   . Diabetes Mother   . Cancer Mother     uterine  . Colon cancer Neg Hx    History  Substance Use Topics  . Smoking  status: Never Smoker   . Smokeless tobacco: Never Used  . Alcohol Use: Yes     Comment: rare occ   OB History   Grav Para Term Preterm Abortions TAB SAB Ect Mult Living                 Review of Systems  Constitutional: Positive for fatigue. Negative for fever and chills.  HENT: Positive for congestion, ear pain, hearing loss, sinus pressure and sore throat.   Eyes: Positive for itching.  Respiratory: Positive for cough. Negative for chest tightness.   Gastrointestinal: Negative for nausea and vomiting.  All other systems reviewed and are negative.   Allergies  Nabumetone and Latex  Home Medications   Prior to Admission medications   Medication Sig Start Date End Date Taking? Authorizing Provider  Azelaic Acid (FINACEA) 15 % cream Apply topically Nightly. After skin is thoroughly washed and patted dry, gently but thoroughly massage a thin film of azelaic acid cream into the affected area twice daily, in the morning and evening.    Historical Provider, MD  calcium gluconate 500 MG tablet Take 1,000 mg by mouth daily.    Historical Provider, MD  Calcium-Magnesium-Vitamin D (CALCIUM MAGNESIUM PO) Take by mouth. 1000/500, take one by mouth daily    Historical Provider, MD  Cholecalciferol (VITAMIN D-3  PO) Take 5,000 Units by mouth daily.    Historical Provider, MD  doxycycline (VIBRAMYCIN) 100 MG capsule Take 1 capsule (100 mg total) by mouth 2 (two) times daily. 09/17/13   Liam Graham, PA-C  Ergocalciferol (VITAMIN D2 PO) Take 5,000 mg by mouth daily.    Historical Provider, MD  fluticasone (FLONASE) 50 MCG/ACT nasal spray Place 2 sprays into both nostrils 2 (two) times daily. Decrease to 2 sprays/nostril daily after 5 days 09/17/13   Liam Graham, PA-C  Kelp 225 MCG TABS Take by mouth daily.    Historical Provider, MD  methylPREDNISolone (MEDROL DOSEPAK) 4 MG tablet Use as directed on package instructions 09/17/13   Liam Graham, PA-C  thyroid (ARMOUR) 120 MG tablet Take 1  tablet (120 mg total) by mouth daily with breakfast. (No substitutions) 09/15/12 09/15/13  Ricard Dillon, MD   BP 156/79  Pulse 78  Temp(Src) 98.4 F (36.9 C) (Oral)  Resp 12  SpO2 99% Physical Exam  Nursing note and vitals reviewed. Constitutional: She is oriented to person, place, and time. Vital signs are normal. She appears well-developed and well-nourished. No distress.  HENT:  Head: Normocephalic and atraumatic.  Right Ear: Tympanic membrane is bulging. A middle ear effusion (Grossly bloody with air-fluid levels) is present. Decreased hearing is noted.  Left Ear: Tympanic membrane is bulging. A middle ear effusion (Grossly bloody with air-fluid levels) is present. Decreased hearing is noted.  Nose: Mucosal edema and rhinorrhea present. Right sinus exhibits maxillary sinus tenderness and frontal sinus tenderness. Left sinus exhibits maxillary sinus tenderness and frontal sinus tenderness.  Mouth/Throat: Uvula is midline and mucous membranes are normal.  Purulent drainage in the posterior oropharynx  Eyes: Conjunctivae are normal. Right eye exhibits no discharge. Left eye exhibits no discharge.  Neck: Normal range of motion.  Pulmonary/Chest: Effort normal and breath sounds normal. No respiratory distress.  Lymphadenopathy:       Head (right side): Tonsillar adenopathy present.       Head (left side): Tonsillar adenopathy present.    She has cervical adenopathy.       Right cervical: Superficial cervical adenopathy present.       Left cervical: Superficial cervical adenopathy present.  Neurological: She is alert and oriented to person, place, and time. She has normal strength. Coordination normal.  Skin: Skin is warm and dry. No rash noted. She is not diaphoretic.  Psychiatric: She has a normal mood and affect. Judgment normal.    ED Course  Procedures (including critical care time) Labs Review Labs Reviewed  POCT RAPID STREP A ([facility] CARE ONLY)    Imaging Review No  results found.   MDM   1. Sinusitis   2. Otitis media   3. Barotrauma    We'll give a dose of prednisone and doxycycline here and discharge with Medrol Dosepak, doxycycline, Flonase. Take Advil sinus for sinus pain and pressure. Followup as needed   Meds ordered this encounter  Medications  . predniSONE (DELTASONE) tablet 40 mg    Sig:   . doxycycline (VIBRAMYCIN) 100 MG capsule    Sig: Take 1 capsule (100 mg total) by mouth 2 (two) times daily.    Dispense:  20 capsule    Refill:  0    Order Specific Question:  Supervising Provider    Answer:  Jake Michaelis, DAVID C [id number]  . doxycycline (VIBRA-TABS) tablet 200 mg    Sig:   . fluticasone (FLONASE) 50 MCG/ACT nasal spray  Sig: Place 2 sprays into both nostrils 2 (two) times daily. Decrease to 2 sprays/nostril daily after 5 days    Dispense:  16 g    Refill:  2    Order Specific Question:  Supervising Provider    Answer:  Jake Michaelis, DAVID C [id number]  . methylPREDNISolone (MEDROL DOSEPAK) 4 MG tablet    Sig: Use as directed on package instructions    Dispense:  21 tablet    Refill:  0    Order Specific Question:  Supervising Provider    Answer:  Jake Michaelis, DAVID C [[id number]]       Liam Graham, PA-C 09/17/13 2123

## 2013-09-17 NOTE — ED Notes (Signed)
C/o sinus pressure and sore throat States she did just get back from a bus trip where every one was sick States she went to [place] States her ears feels funny; states her hearing feels muffled.

## 2013-09-18 NOTE — ED Provider Notes (Signed)
Medical screening examination/treatment/procedure(s) were performed by non-physician practitioner and as supervising physician I was immediately available for consultation/collaboration.  Philipp Deputy, M.D.  Harden Mo, MD 09/18/13 [phone]

## 2013-09-19 LAB — CULTURE, GROUP A STREP

## 2013-09-24 ENCOUNTER — Encounter: Payer: Self-pay | Admitting: Internal Medicine

## 2013-09-24 DIAGNOSIS — E039 Hypothyroidism, unspecified: Secondary | ICD-10-CM

## 2013-09-28 MED ORDER — THYROID 120 MG PO TABS: 120.0000 mg | ORAL_TABLET | Freq: Every day | ORAL | Status: DC

## 2013-10-06 ENCOUNTER — Encounter: Payer: Self-pay | Admitting: Physician Assistant

## 2013-10-06 ENCOUNTER — Encounter: Payer: Self-pay | Admitting: Internal Medicine

## 2013-10-06 ENCOUNTER — Ambulatory Visit (INDEPENDENT_AMBULATORY_CARE_PROVIDER_SITE_OTHER): Payer: BC Managed Care – PPO | Admitting: Physician Assistant

## 2013-10-06 VITALS — BP 120/80 | HR 84 | Temp 97.9°F | Resp 18 | Wt 214.0 lb

## 2013-10-06 DIAGNOSIS — J029 Acute pharyngitis, unspecified: Secondary | ICD-10-CM

## 2013-10-06 NOTE — Progress Notes (Signed)
Subjective:    Patient ID: Jacqueline Jennings, female    DOB: 17-Feb-1951, 63 y.o.   MRN: [id number]  Cough This is a recurrent (recently treated with course of doxycycline and medrol dose pack for otitis media.) problem. The current episode started yesterday. The problem has been gradually worsening. The problem occurs every few minutes. The cough is non-productive. Associated symptoms include ear congestion, ear pain, nasal congestion, postnasal drip, rhinorrhea and a sore throat. Pertinent negatives include no chest pain, chills, fever, headaches, heartburn, hemoptysis, myalgias, rash, shortness of breath, sweats, weight loss or wheezing. Nothing aggravates the symptoms. Risk factors for lung disease include travel (Traveled to [place], episode of otitis media occured after returning from trip.). Treatments tried: ibuprofen, sinus rinse. The treatment provided mild relief. There is no history of asthma, COPD or environmental allergies.    Pt saw allergist on may 11th, was tested for allergies. All allergy testing returned negative. Pt was given a netti pot and told no further treatment was necessary.   Review of Systems  Constitutional: Negative for fever, chills and weight loss.  HENT: Positive for ear pain, postnasal drip, rhinorrhea and sore throat.   Respiratory: Positive for cough. Negative for hemoptysis, shortness of breath and wheezing.   Cardiovascular: Negative for chest pain.  Gastrointestinal: Negative for heartburn, nausea, vomiting and diarrhea.  Musculoskeletal: Negative for myalgias.  Skin: Negative for rash.  Allergic/Immunologic: Negative for environmental allergies.  Neurological: Negative for headaches.  All other systems reviewed and are negative.  Past Medical History  Diagnosis Date  . Nephrolithiasis     hx of  . IBS (irritable bowel syndrome)   . Hyperlipidemia   . Breast cancer     hx of with rediation, surgery and adjuvant theapy with tamoxifen  (year two)    . Hypothyroidism 09-27-11    Low function  . Sleep apnea 09-27-11    Cpap used ,started 2'2011  . Arthritis 09-27-11    Right knee/Cortisone injection 2'13    History   Social History  . Marital Status: Married    Spouse Name: N/A    Number of Children: N/A  . Years of Education: N/A   Occupational History  . Not on file.   Social History Main Topics  . Smoking status: Never Smoker   . Smokeless tobacco: Never Used  . Alcohol Use: Yes     Comment: rare occ  . Drug Use: No  . Sexual Activity: Yes   Other Topics Concern  . Not on file   Social History Narrative  . No narrative on file    Past Surgical History  Procedure Laterality Date  . Tonsillectomy    . Myomectomy  1990  . Kidney stones  1977  . Cataract extraction  09-27-11    Bilateral  . Breast biopsy  1977    right  . Breast lumpectomy  2006    raditon 33 tx-right  . Knee arthroscopy  10/02/2011    Procedure: ARTHROSCOPY KNEE;  Surgeon: Gearlean Alf, MD;  Location: [facility];  Service: Orthopedics;  Laterality: Right;  with Debridement, right knee medial meniscus    Family History  Problem Relation Age of Onset  . Hypertension Mother   . Diabetes Mother   . Cancer Mother     uterine  . Colon cancer Neg Hx     Allergies  Allergen Reactions  . Nabumetone     REACTION: Rash,Hives  . Latex Rash    Adhesives products-causes reddness,  rash    Current Outpatient Prescriptions on File Prior to Visit  Medication Sig Dispense Refill  . Azelaic Acid (FINACEA) 15 % cream Apply topically Nightly. After skin is thoroughly washed and patted dry, gently but thoroughly massage a thin film of azelaic acid cream into the affected area twice daily, in the morning and evening.      . calcium gluconate 500 MG tablet Take 1,000 mg by mouth daily.      . Calcium-Magnesium-Vitamin D (CALCIUM MAGNESIUM PO) Take by mouth. 1000/500, take one by mouth daily      . Cholecalciferol (VITAMIN D-3 PO) Take 5,000 Units by mouth  daily.      . Ergocalciferol (VITAMIN D2 PO) Take 5,000 mg by mouth daily.      Marland Kitchen Kelp 225 MCG TABS Take by mouth daily.      Marland Kitchen thyroid (ARMOUR) 120 MG tablet Take 1 tablet (120 mg total) by mouth daily with breakfast. (No substitutions)  90 tablet  1  . fluticasone (FLONASE) 50 MCG/ACT nasal spray Place 2 sprays into both nostrils 2 (two) times daily. Decrease to 2 sprays/nostril daily after 5 days  16 g  2   No current facility-administered medications on file prior to visit.    EXAM: BP 120/80  Pulse 84  Temp(Src) 97.9 F (36.6 C) (Oral)  Wt 214 lb (97.07 kg)  SpO2 98%     Objective:   Physical Exam  Nursing note and vitals reviewed. Constitutional: She is oriented to person, place, and time. She appears well-developed and well-nourished. No distress.  HENT:  Head: Normocephalic and atraumatic.  Right Ear: External ear normal.  Left Ear: External ear normal.  Nose: Nose normal.  Mouth/Throat: No oropharyngeal exudate.  Oropharynx is slightly erythematous with cobblestoning, no exudate. Bilateral TMs slightly bulging, otherwise normal. Bilateral frontal and maxillary sinuses non-TTP.   Eyes: Conjunctivae and EOM are normal. Pupils are equal, round, and reactive to light.  Neck: Normal range of motion. Neck supple. No JVD present.  Cardiovascular: Normal rate, regular rhythm, normal heart sounds and intact distal pulses.  Exam reveals no gallop and no friction rub.   No murmur heard. Pulmonary/Chest: Effort normal and breath sounds normal. No stridor. No respiratory distress. She has no wheezes. She has no rales. She exhibits no tenderness.  Musculoskeletal: Normal range of motion.  Lymphadenopathy:    She has no cervical adenopathy.  Neurological: She is alert and oriented to person, place, and time.  Skin: Skin is warm and dry. No rash noted. She is not diaphoretic. No erythema. No pallor.  Psychiatric: She has a normal mood and affect. Her behavior is normal. Judgment  and thought content normal.    Lab Results  Component Value Date   WBC 6.0 02/01/2013   HGB 14.1 02/01/2013   HCT 42.4 02/01/2013   PLT 287.0 02/01/2013   GLUCOSE 107* 02/01/2013   CHOL 199 02/01/2013   TRIG 113.0 02/01/2013   HDL 53.70 02/01/2013   LDLCALC 123* 02/01/2013   ALT 21 02/01/2013   AST 17 02/01/2013   NA 143 02/01/2013   K 4.9 02/01/2013   CL 105 02/01/2013   CREATININE 0.6 02/01/2013   BUN 14 02/01/2013   CO2 26 02/01/2013   TSH 0.05* 02/01/2013           Assessment & Plan:  Kayti was seen today for cough.  Diagnoses and associated orders for this visit:  Sore throat Comments: Recurrent, with cough, ear symptoms. Allergy signed off.  Will do trial of daily Nasacort, Zyrtec, and Zantac. Will refer to ENT for evaluation of recurrence. - POC Rapid Strep A - Throat culture Randell Loop) - Ambulatory referral to ENT    Will refer pt to ENT to evaluate recurrent sinus/ear infections/symptoms over the past 6 months, now that Allergy has seen pt and determined no allergic cause to symptoms. Plan to continue Nasal steroid as pt did not have recurrence of ear symptoms until discontinuing nasal steroid. Encourage pt to not forcibly blow her nose after using sinus rinse as this can force saline rinse solution into eustachian tubes, and to always use distilled water. Plan to utilize daily antihistamine and H2 blocker to block histamine entirely and see if this helps reduce symptoms.   Return precautions provided, and patient handout on sore throat.  Plan to follow up as needed, or for worsening or persistent symptoms despite treatment.  Patient Instructions  You will be called to schedule an appointment with ENT for you recurrent sinus/ear infections.  Plain Over the Counter Mucinex (NOT Mucinex D) for thick secretions  Force NON dairy fluids, drinking plenty of water is best.    Over the Counter Flonase OR Nasacort AQ 1 spray in each nostril twice a day as needed.  Use the "crossover" technique into opposite nostril spraying toward opposite ear @ 45 degree angle, not straight up into nostril.   Plain Over the Counter Allegra (NOT D )  160 daily , OR Loratidine 10 mg , OR Zyrtec 10 mg @ bedtime  as needed for itchy eyes & sneezing.   Saline Irrigation and Saline Sprays can also help reduce symptoms. No forceful nose blowing immediately following saline irrigation.  Over the counter Zantac may help reduce cough symptoms as well if reflux related.  Keep future appointment to establish with Dr. Maudie Mercury.  If emergency symptoms discussed during visit developed, seek medical attention immediately.  Followup as needed, or for worsening or persistent symptoms despite treatment prior to ENT visit.

## 2013-10-06 NOTE — Telephone Encounter (Signed)
Pt came into office for an appointment.

## 2013-10-06 NOTE — Progress Notes (Signed)
Pre visit review using our clinic review tool, if applicable. No additional management support is needed unless otherwise documented below in the visit note. 

## 2013-10-06 NOTE — Patient Instructions (Signed)
You will be called to schedule an appointment with ENT for you recurrent sinus/ear infections.  Plain Over the Counter Mucinex (NOT Mucinex D) for thick secretions  Force NON dairy fluids, drinking plenty of water is best.    Over the Counter Flonase OR Nasacort AQ 1 spray in each nostril twice a day as needed. Use the "crossover" technique into opposite nostril spraying toward opposite ear @ 45 degree angle, not straight up into nostril.   Plain Over the Counter Allegra (NOT D )  160 daily , OR Loratidine 10 mg , OR Zyrtec 10 mg @ bedtime  as needed for itchy eyes & sneezing.   Saline Irrigation and Saline Sprays can also help reduce symptoms. No forceful nose blowing immediately following saline irrigation.  Over the counter Zantac may help reduce cough symptoms as well if reflux related.  Keep future appointment to establish with Dr. Maudie Mercury.  If emergency symptoms discussed during visit developed, seek medical attention immediately.  Followup as needed, or for worsening or persistent symptoms despite treatment prior to ENT visit.      Sore Throat A sore throat is a painful, burning, sore, or scratchy feeling of the throat. There may be pain or tenderness when swallowing or talking. You may have other symptoms with a sore throat. These include coughing, sneezing, fever, or a swollen neck. A sore throat is often the first sign of another sickness. These sicknesses may include a cold, flu, strep throat, or an infection called mono. Most sore throats go away without medical treatment.  HOME CARE   Only take medicine as told by your doctor.  Drink enough fluids to keep your pee (urine) clear or pale yellow.  Rest as needed.  Try using throat sprays, lozenges, or suck on hard candy (if older than 4 years or as told).  Sip warm liquids, such as broth, herbal tea, or warm water with honey. Try sucking on frozen ice pops or drinking cold liquids.  Rinse the mouth (gargle) with salt  water. Mix 1 teaspoon salt with 8 ounces of water.  Do not smoke. Avoid being around others when they are smoking.  Put a humidifier in your bedroom at night to moisten the air. You can also turn on a hot shower and sit in the bathroom for 5-10 minutes. Be sure the bathroom door is closed. GET HELP RIGHT AWAY IF:   You have trouble breathing.  You cannot swallow fluids, soft foods, or your spit (saliva).  You have more puffiness (swelling) in the throat.  Your sore throat does not get better in 7 days.  You feel sick to your stomach (nauseous) and throw up (vomit).  You have a fever or lasting symptoms for more than 2-3 days.  You have a fever and your symptoms suddenly get worse. MAKE SURE YOU:   Understand these instructions.  Will watch your condition.  Will get help right away if you are not doing well or get worse. Document Released: 01/16/2008 Document Revised: 01/01/2012 Document Reviewed: 12/15/2011 [facility] Patient Information 2015 [place], [place]. This information is not intended to replace advice given to you by your health care provider. Make sure you discuss any questions you have with your health care provider.

## 2013-10-08 LAB — CULTURE, GROUP A STREP: ORGANISM ID, BACTERIA: NORMAL

## 2013-10-09 ENCOUNTER — Ambulatory Visit: Payer: BC Managed Care – PPO | Admitting: Family Medicine

## 2013-11-05 ENCOUNTER — Encounter: Payer: Self-pay | Admitting: Internal Medicine

## 2013-11-09 ENCOUNTER — Encounter: Payer: Self-pay | Admitting: Family Medicine

## 2013-11-09 ENCOUNTER — Ambulatory Visit (INDEPENDENT_AMBULATORY_CARE_PROVIDER_SITE_OTHER): Payer: BC Managed Care – PPO | Admitting: Family Medicine

## 2013-11-09 VITALS — BP 120/78 | HR 85 | Temp 98.3°F | Ht 66.0 in | Wt 219.0 lb

## 2013-11-09 DIAGNOSIS — K589 Irritable bowel syndrome without diarrhea: Secondary | ICD-10-CM

## 2013-11-09 DIAGNOSIS — R5383 Other fatigue: Secondary | ICD-10-CM

## 2013-11-09 DIAGNOSIS — E785 Hyperlipidemia, unspecified: Secondary | ICD-10-CM

## 2013-11-09 DIAGNOSIS — Z87442 Personal history of urinary calculi: Secondary | ICD-10-CM

## 2013-11-09 DIAGNOSIS — G4733 Obstructive sleep apnea (adult) (pediatric): Secondary | ICD-10-CM

## 2013-11-09 DIAGNOSIS — M1990 Unspecified osteoarthritis, unspecified site: Secondary | ICD-10-CM

## 2013-11-09 DIAGNOSIS — R5381 Other malaise: Secondary | ICD-10-CM

## 2013-11-09 DIAGNOSIS — E559 Vitamin D deficiency, unspecified: Secondary | ICD-10-CM

## 2013-11-09 DIAGNOSIS — E039 Hypothyroidism, unspecified: Secondary | ICD-10-CM

## 2013-11-09 DIAGNOSIS — Z853 Personal history of malignant neoplasm of breast: Secondary | ICD-10-CM

## 2013-11-09 NOTE — Patient Instructions (Signed)
-  We have ordered labs or studies at this visit. It can take up to 1-2 weeks for results and processing. We will contact you with instructions IF your results are abnormal. Normal results will be released to your [facility]. If you have not heard from [place] or can not find your results in [facility] in 2 weeks please contact our office.  -We placed a referral for you as discussed to the endocrinologist. It usually takes about 1-2 weeks to process and schedule this referral. If you have not heard from [place] regarding this appointment in 2 weeks please contact our office.   -PLEASE SIGN UP FOR MYCHART TODAY   We recommend the following healthy lifestyle measures: - eat a healthy diet consisting of lots of vegetables, fruits, beans, nuts, seeds, healthy meats such as white chicken and fish and whole grains.  - avoid fried foods, fast food, processed foods, sodas, red meet and other fattening foods.  - get a least 150 minutes of aerobic exercise per week.   Consider counseling  Follow up in: 3 months for physical, schedule labs appointment when you want to do labs fasting

## 2013-11-09 NOTE — Progress Notes (Signed)
No chief complaint on file.   HPI:  Jacqueline Jennings is here to establish care. Transfer from Dr. Arnoldo Morale. Last PCP and physical:   Has the following chronic problems and concerns today:  Patient Active Problem List   Diagnosis Date Noted  . Obesity, unspecified 10/01/2012  . UNSPECIFIED VITAMIN D DEFICIENCY 06/06/2009  . SLEEP APNEA, OBSTRUCTIVE 01/11/2009  . OSTEOPENIA 07/17/2007  . HYPOTHYROIDISM 07/10/2007  . HYPERLIPIDEMIA 05/19/2007  . IRRITABLE BOWEL SYNDROME 05/19/2007  . BREAST CANCER, HX OF 05/19/2007   Hypothyroidism: -taking armor thyroid, but she feels like it has not been working well for a long time -report for many many years just does not have enough energy and feels down -tries to eat healthy; does not exercise on a regular basis -denies: weight loss, fevers, melena, hematochezia -wants to wait to do labs with endo or at phsyical  IBS: -chronically feels like digestion is off -BM daily but has bloating after eating -reports Dr. Arnoldo Morale had her see nutritionist and she was not happy with this -followed by Dr. Fuller Plan - reports had colonoscopy   Depression: -reports Dr. Arnoldo Morale told her this was and issue -no thoughts of self harm   Roscea: -on finacea  OSA: -on CPAP, dx in 2010  Hx of Breast cancer: -2006, Sees Dr. Barbette Hair and Patria Mane in gyn- sees yearly  Interestingly does not have any of these problems when in [place]  ROS negative for unless reported above: fevers, unintentional weight loss, hearing or vision loss, chest pain, palpitations, struggling to breath, hemoptysis, melena, hematochezia, hematuria, falls, loc, si, thoughts of self harm  Past Medical History  Diagnosis Date  . Nephrolithiasis     hx of  . IBS (irritable bowel syndrome)   . Hyperlipidemia   . Breast cancer     hx of with rediation, surgery and adjuvant theapy with tamoxifen  (year two)  . Hypothyroidism 09-27-11    Low function  . Sleep apnea 09-27-11   Cpap used ,started 2'2011  . Arthritis 09-27-11    Right knee/Cortisone injection 2'13  . Acute medial meniscal tear 10/02/2011  . OSTEOARTHROS UNSPEC WHETHER GEN/LOC [facility] SITE 02/28/2009    Qualifier: Diagnosis of  By: Arnoldo Morale MD, [place], HX OF 11/26/2006    Qualifier: Diagnosis of  By: Jimmye Norman LPN, Winfield Cunas     Family History  Problem Relation Age of Onset  . Hypertension Mother   . Diabetes Mother   . Cancer Mother     uterine  . Colon cancer Neg Hx     History   Social History  . Marital Status: Married    Spouse Name: N/A    Number of Children: N/A  . Years of Education: N/A   Social History Main Topics  . Smoking status: Never Smoker   . Smokeless tobacco: Never Used  . Alcohol Use: Yes     Comment: rare occ  . Drug Use: No  . Sexual Activity: Yes   Other Topics Concern  . None   Social History Narrative   Work or School: retired Education officer, museum, travels a Quarry manager Situation: lives with husband      Spiritual Beliefs: Christian      Lifestyle: no regular exercise, healthy diet             Current outpatient prescriptions:Azelaic Acid (FINACEA) 15 % cream, Apply topically Nightly. After skin is thoroughly washed and patted dry, gently but thoroughly  massage a thin film of azelaic acid cream into the affected area twice daily, in the morning and evening., Disp: , Rfl: ;  Calcium-Magnesium-Vitamin D (CALCIUM MAGNESIUM PO), Take by mouth. 1000/500, take one by mouth daily, Disp: , Rfl:  Cholecalciferol (VITAMIN D-3 PO), Take 5,000 Units by mouth daily., Disp: , Rfl: ;  Kelp 225 MCG TABS, Take by mouth daily., Disp: , Rfl: ;  thyroid (ARMOUR) 120 MG tablet, Take 1 tablet (120 mg total) by mouth daily with breakfast. (No substitutions), Disp: 90 tablet, Rfl: 1  EXAM:  Filed Vitals:   11/09/13 1011  BP: 120/78  Pulse: 85  Temp: 98.3 F (36.8 C)    Body mass index is 35.36 kg/(m^2).  GENERAL: vitals reviewed and listed above, alert,  oriented, appears well hydrated and in no acute distress  HEENT: atraumatic, conjunttiva clear, no obvious abnormalities on inspection of external nose and ears  NECK: no obvious masses on inspection  LUNGS: clear to auscultation bilaterally, no wheezes, rales or rhonchi, good air movement  CV: HRRR, no peripheral edema  MS: moves all extremities without noticeable abnormality  PSYCH: pleasant and cooperative, no obvious depression or anxiety  ASSESSMENT AND PLAN:  Discussed the following assessment and plan:  HYPOTHYROIDISM - Plan: Ambulatory referral to Endocrinology, TSH, T4, Free  HYPERLIPIDEMIA - Plan: Lipid Panel  Unspecified vitamin D deficiency  SLEEP APNEA, OBSTRUCTIVE  Irritable bowel syndrome - Plan: CBC with Differential  OSTEOARTHROS UNSPEC WHETHER GEN/LOC UNSPEC SITE  BREAST CANCER, HX OF  NEPHROLITHIASIS, HX OF - Plan: Basic metabolic panel  Other malaise and fatigue - Plan: Basic metabolic panel, CBC with Differential   -We reviewed the PMH, PSH, FH, SH, Meds and Allergies. -We provided refills for any medications we will prescribe as needed. -We addressed current concerns per orders and patient instructions. -We have asked for records for pertinent exams, studies, vaccines and notes from previous providers. -We have advised patient to follow up per instructions below. -follow up for medicatal physical  -Patient advised to return or notify a doctor immediately if symptoms worsen or persist or new concerns arise.  Patient Instructions  -We have ordered labs or studies at this visit. It can take up to 1-2 weeks for results and processing. We will contact you with instructions IF your results are abnormal. Normal results will be released to your [facility]. If you have not heard from [place] or can not find your results in [facility] in 2 weeks please contact our office.  -We placed a referral for you as discussed to the endocrinologist. It usually takes about 1-2  weeks to process and schedule this referral. If you have not heard from [place] regarding this appointment in 2 weeks please contact our office.   -PLEASE SIGN UP FOR MYCHART TODAY   We recommend the following healthy lifestyle measures: - eat a healthy diet consisting of lots of vegetables, fruits, beans, nuts, seeds, healthy meats such as white chicken and fish and whole grains.  - avoid fried foods, fast food, processed foods, sodas, red meet and other fattening foods.  - get a least 150 minutes of aerobic exercise per week.   Consider counseling  Follow up in: 3 months for physical, schedule labs appointment when you want to do labs fasting      Nalina Yeatman R.

## 2013-11-09 NOTE — Progress Notes (Signed)
Pre visit review using our clinic review tool, if applicable. No additional management support is needed unless otherwise documented below in the visit note. 

## 2013-11-17 ENCOUNTER — Telehealth: Payer: Self-pay | Admitting: Family Medicine

## 2013-11-17 NOTE — Telephone Encounter (Signed)
Pt stated dr Cruzita Lederer would like a pt to have t3 and or t3 free.

## 2013-11-18 ENCOUNTER — Other Ambulatory Visit: Payer: Self-pay | Admitting: *Deleted

## 2013-11-18 DIAGNOSIS — E039 Hypothyroidism, unspecified: Secondary | ICD-10-CM

## 2013-11-18 NOTE — Telephone Encounter (Signed)
Ok to add t3 if patient wishes

## 2013-11-18 NOTE — Telephone Encounter (Signed)
Patient informed and order for T3 was entered and she states she already made a lab appt for next week.

## 2013-11-23 ENCOUNTER — Other Ambulatory Visit (INDEPENDENT_AMBULATORY_CARE_PROVIDER_SITE_OTHER): Payer: BC Managed Care – PPO

## 2013-11-23 DIAGNOSIS — Z87442 Personal history of urinary calculi: Secondary | ICD-10-CM

## 2013-11-23 DIAGNOSIS — K589 Irritable bowel syndrome without diarrhea: Secondary | ICD-10-CM

## 2013-11-23 DIAGNOSIS — E785 Hyperlipidemia, unspecified: Secondary | ICD-10-CM

## 2013-11-23 DIAGNOSIS — R5383 Other fatigue: Secondary | ICD-10-CM

## 2013-11-23 DIAGNOSIS — R5381 Other malaise: Secondary | ICD-10-CM

## 2013-11-23 DIAGNOSIS — E039 Hypothyroidism, unspecified: Secondary | ICD-10-CM

## 2013-11-23 LAB — CBC WITH DIFFERENTIAL/PLATELET
Basophils Absolute: 0 10*3/uL (ref 0.0–0.1)
Basophils Relative: 0.4 % (ref 0.0–3.0)
EOS PCT: 2.3 % (ref 0.0–5.0)
Eosinophils Absolute: 0.1 10*3/uL (ref 0.0–0.7)
HCT: 42 % (ref 36.0–46.0)
HEMOGLOBIN: 13.8 g/dL (ref 12.0–15.0)
Lymphocytes Relative: 27.4 % (ref 12.0–46.0)
Lymphs Abs: 1.7 10*3/uL (ref 0.7–4.0)
MCHC: 32.8 g/dL (ref 30.0–36.0)
MCV: 84.6 fl (ref 78.0–100.0)
MONO ABS: 0.6 10*3/uL (ref 0.1–1.0)
Monocytes Relative: 9 % (ref 3.0–12.0)
NEUTROS ABS: 3.8 10*3/uL (ref 1.4–7.7)
Neutrophils Relative %: 60.9 % (ref 43.0–77.0)
PLATELETS: 332 10*3/uL (ref 150.0–400.0)
RBC: 4.96 Mil/uL (ref 3.87–5.11)
RDW: 14.7 % (ref 11.5–15.5)
WBC: 6.3 10*3/uL (ref 4.0–10.5)

## 2013-11-23 LAB — LIPID PANEL
CHOLESTEROL: 193 mg/dL (ref 0–200)
HDL: 60.2 mg/dL (ref >39.00)
LDL Cholesterol: 117 mg/dL — ABNORMAL HIGH (ref 0–99)
NonHDL: 132.8
TRIGLYCERIDES: 81 mg/dL (ref 0.0–149.0)
Total CHOL/HDL Ratio: 3
VLDL: 16.2 mg/dL (ref 0.0–40.0)

## 2013-11-23 LAB — BASIC METABOLIC PANEL
BUN: 12 mg/dL (ref 6–23)
CO2: 29 mEq/L (ref 19–32)
Calcium: 9.4 mg/dL (ref 8.4–10.5)
Chloride: 104 mEq/L (ref 96–112)
Creatinine, Ser: 0.6 mg/dL (ref 0.4–1.2)
GFR: 99.91 mL/min (ref >60.00)
GLUCOSE: 97 mg/dL (ref 70–99)
POTASSIUM: 4.8 meq/L (ref 3.5–5.1)
Sodium: 141 mEq/L (ref 135–145)

## 2013-11-23 LAB — T4, FREE: Free T4: 1.02 ng/dL (ref 0.60–1.60)

## 2013-11-23 LAB — TSH: TSH: 0.05 u[IU]/mL — AB (ref 0.35–4.50)

## 2013-11-24 LAB — T3, FREE: T3, Free: 5.4 pg/mL — ABNORMAL HIGH (ref 2.3–4.2)

## 2013-11-29 ENCOUNTER — Encounter: Payer: Self-pay | Admitting: Internal Medicine

## 2013-11-29 ENCOUNTER — Ambulatory Visit (INDEPENDENT_AMBULATORY_CARE_PROVIDER_SITE_OTHER): Payer: BC Managed Care – PPO | Admitting: Internal Medicine

## 2013-11-29 VITALS — BP 126/80 | HR 79 | Temp 97.9°F | Resp 12 | Ht 66.0 in | Wt 219.0 lb

## 2013-11-29 DIAGNOSIS — E039 Hypothyroidism, unspecified: Secondary | ICD-10-CM

## 2013-11-29 MED ORDER — ARMOUR THYROID 90 MG PO TABS: 90.0000 mg | ORAL_TABLET | Freq: Every day | ORAL | Status: DC

## 2013-11-29 NOTE — Progress Notes (Signed)
Patient ID: Jacqueline Jennings, female   DOB: 27-Mar-1951, 63 y.o.   MRN: [id number]   HPI  Jacqueline Jennings is a 63 y.o.-year-old female, referred by her PCP, Dr. Maudie Mercury, for management of uncontrolled hypothyroidism.  Pt. has been dx with hypothyroidism in 2007-2008; started on Synthroid >> not feeling better >> switched on Armour 120 mg (equivalent of LT4 200 mcg), taken: - fasting - with water - separated by >30 min from b'fast  - takes calcium - with b'fast - no iron, PPIs, multivitamins   I reviewed pt's latest thyroid tests: Component     Latest Ref Rng 11/23/2013  TSH     0.35 - 4.50 uIU/mL 0.05 (L)  Free T4     0.60 - 1.60 ng/dL 1.02  T3, Free     2.3 - 4.2 pg/mL 5.4 (H)   Revieweing her TFTs, she has been thyrotoxic since 2012: Lab Results  Component Value Date   TSH 0.05* 11/23/2013   TSH 0.05* 02/01/2013   TSH 0.10* 05/26/2012   TSH 0.04* 02/20/2012   TSH 0.04* 09/03/2011   TSH 0.10* 04/18/2011   TSH 1.33 01/14/2011   TSH 1.68 07/05/2010   TSH 1.06 10/12/2009   TSH 3.23 08/10/2009   FREET4 1.02 11/23/2013   FREET4 0.89 05/26/2012   FREET4 0.92 02/20/2012   FREET4 0.87 04/18/2011   FREET4 0.76 01/14/2011   FREET4 0.72 10/12/2009   FREET4 0.9 08/10/2009   FREET4 0.7 06/16/2008    Pt describes: - +++ fatigue - + bloating after meals - + weight gain - + heat intolerance - + constipation - + dry skin - + hair falling - + hot flushes - no diarrhea - no depression/no anxiety  Pt denies feeling nodules in neck, hoarseness, dysphagia/odynophagia, SOB with lying down.  She has no FH of thyroid disorders. No FH of thyroid cancer.  No h/o radiation tx to head or neck. + recent use of iodine supplements - started using Kelp 6 mo ago.   I reviewed her chart and she also has a history of BrCa 2006 >> RxTx to R Breast.   ROS: Constitutional: see HPI Eyes: no blurry vision, no xerophthalmia ENT: no sore throat, no nodules palpated in throat, no dysphagia/odynophagia, no  hoarseness Cardiovascular: no CP/SOB/palpitations/leg swelling Respiratory: no cough/SOB Gastrointestinal: no N/V/D/+ C Musculoskeletal: no muscle/+ joint aches Skin: no rashes, + easy bruising, + hair loss Neurological: no tremors/numbness/tingling/dizziness Psychiatric: no depression/anxiety  Past Medical History  Diagnosis Date  . Nephrolithiasis     hx of  . IBS (irritable bowel syndrome)   . Hyperlipidemia   . Breast cancer     hx of with rediation, surgery and adjuvant theapy with tamoxifen  (year two)  . Hypothyroidism 09-27-11    Low function  . Sleep apnea 09-27-11    Cpap used ,started 2'2011  . Arthritis 09-27-11    Right knee/Cortisone injection 2'13  . Acute medial meniscal tear 10/02/2011  . OSTEOARTHROS UNSPEC WHETHER GEN/LOC [facility] SITE 02/28/2009    Qualifier: Diagnosis of  By: Arnoldo Morale MD, [place], HX OF 11/26/2006    Qualifier: Diagnosis of  By: Jimmye Norman LPN, Winfield Cunas    Past Surgical History  Procedure Laterality Date  . Tonsillectomy    . Myomectomy  1990  . Kidney stones  1977  . Cataract extraction  09-27-11    Bilateral  . Breast biopsy  1977    right  . Breast lumpectomy  2006  raditon 33 tx-right  . Knee arthroscopy  10/02/2011    Procedure: ARTHROSCOPY KNEE;  Surgeon: Gearlean Alf, MD;  Location: [facility];  Service: Orthopedics;  Laterality: Right;  with Debridement, right knee medial meniscus   History   Social History  . Marital Status: Married    Spouse Name: N/A    Number of Children: 0   Occupational History  . homemaker   Social History Main Topics  . Smoking status: Never Smoker   . Smokeless tobacco: Never Used  . Alcohol Use: Yes     Comment: rare occ - wine, beer - 1 drink   . 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  Azelaic Acid (FINACEA) 15 % cream Apply topically Nightly. After skin is thoroughly washed and patted dry, gently but thoroughly massage a thin film of azelaic acid cream into the affected area twice daily, in the morning and evening.      . Calcium-Magnesium-Vitamin D (CALCIUM MAGNESIUM PO) Take by mouth. 1000/500, take one by mouth daily      . Cholecalciferol (VITAMIN D-3 PO) Take 5,000 Units by mouth daily.      Marland Kitchen Kelp 225 MCG TABS Take by mouth daily.      Marland Kitchen thyroid (ARMOUR) 120 MG tablet Take 1 tablet (120 mg total) by mouth daily with breakfast. (No substitutions)  90 tablet  1   No current facility-administered medications on file prior to visit.   Allergies  Allergen Reactions  . Nabumetone     REACTION: Rash,Hives  . Latex Rash    Adhesives products-causes reddness, rash   Family History  Problem Relation Age of Onset  . Hypertension Mother   . Diabetes Mother   . Cancer Mother     uterine  . Colon cancer Neg Hx    PE: BP 126/80  Pulse 79  Temp(Src) 97.9 F (36.6 C) (Oral)  Resp 12  Ht 5' 6"  (1.676 m)  Wt 219 lb (99.338 kg)  BMI 35.36 kg/m2  SpO2 95% Wt Readings from Last 3 Encounters:  11/29/13 219 lb (99.338 kg)  11/09/13 219 lb (99.338 kg)  10/06/13 214 lb (97.07 kg)   Constitutional: obese, in NAD, appears restless Eyes: PERRLA, EOMI, no exophthalmos ENT: moist mucous membranes, no thyromegaly, no cervical lymphadenopathy Cardiovascular: RRR, No MRG Respiratory: CTA B Gastrointestinal: abdomen soft, NT, ND, BS+ Musculoskeletal: no deformities, strength intact in all 4 Skin: palmar hyperhydrosis, warm, no rashes Neurological: + mild tremor with outstretched hands, DTR normal in all 4  ASSESSMENT: 1. Hypothyroidism - over replaced with Armour  PLAN:  1. Patient with long-standing hypothyroidism, on Armour therapy. She appears hyperthyroid.  She does not appear to have a goiter, thyroid nodules, or neck compression symptoms - we reviewed together all  her thyroid labs - we discussed about the risks of overreplacement with thyroid hormones: arrhythmia, hypercoagulability, osteoporosis - we decided to decrease Armour dose to 90 mg (equivalent to 150 mcg of LT4) - sent Rx to pharmacy - we also discussed about stopping kelp - We discussed about correct intake of thyroid hormones, fasting, with water, separated by at least 30 minutes from breakfast, and separated by more than 4 hours from calcium, iron, multivitamins, acid reflux medications (PPIs). I advised her to move calcium  at lunchtime - will check thyroid tests 5-6 weeks after we change the thyroid hh dose: TSH, free T4, free t3 - she will go to [place] in 3 weeks (stays there for 3 weeks) >> will return to repeat labs after she comes back -  I will see her back in 3 months

## 2013-11-29 NOTE — Patient Instructions (Signed)
Please stop the Kelp. Please stop Armour 120 mg and start Armour 90 mg daily. Please take the Armour every day, with water, >30 min before b'fast, separated by >4h from anti acid medication, calcium, iron, multivitamins. Please move calcium tablet at lunchtime. Return in 5-6 weeks for thyroid labs.  Please come back for a follow-up appointment in 3 months.  Have fun in [place]!

## 2014-01-12 ENCOUNTER — Other Ambulatory Visit (INDEPENDENT_AMBULATORY_CARE_PROVIDER_SITE_OTHER): Payer: BC Managed Care – PPO

## 2014-01-12 DIAGNOSIS — E039 Hypothyroidism, unspecified: Secondary | ICD-10-CM

## 2014-01-12 LAB — TSH: TSH: 0.03 u[IU]/mL — AB (ref 0.35–4.50)

## 2014-01-12 LAB — T4, FREE: FREE T4: 0.77 ng/dL (ref 0.60–1.60)

## 2014-01-12 LAB — T3, FREE: T3, Free: 3.7 pg/mL (ref 2.3–4.2)

## 2014-01-13 ENCOUNTER — Other Ambulatory Visit: Payer: Self-pay | Admitting: Internal Medicine

## 2014-01-13 DIAGNOSIS — E039 Hypothyroidism, unspecified: Secondary | ICD-10-CM

## 2014-01-13 MED ORDER — ARMOUR THYROID 60 MG PO TABS: 60.0000 mg | ORAL_TABLET | Freq: Every day | ORAL | Status: DC

## 2014-02-08 ENCOUNTER — Other Ambulatory Visit: Payer: Self-pay | Admitting: Obstetrics and Gynecology

## 2014-02-09 LAB — CYTOLOGY - PAP

## 2014-02-15 ENCOUNTER — Encounter: Payer: Self-pay | Admitting: Family Medicine

## 2014-02-17 ENCOUNTER — Ambulatory Visit (INDEPENDENT_AMBULATORY_CARE_PROVIDER_SITE_OTHER): Payer: BC Managed Care – PPO | Admitting: Family Medicine

## 2014-02-17 ENCOUNTER — Encounter: Payer: Self-pay | Admitting: Family Medicine

## 2014-02-17 VITALS — BP 110/80 | HR 74 | Temp 97.9°F | Ht 67.0 in | Wt 224.1 lb

## 2014-02-17 DIAGNOSIS — Z23 Encounter for immunization: Secondary | ICD-10-CM

## 2014-02-17 DIAGNOSIS — Z0000 Encounter for general adult medical examination without abnormal findings: Secondary | ICD-10-CM

## 2014-02-17 NOTE — Patient Instructions (Signed)
-  Vit D3 [phone] IU; 1200mg  daily of calcium  We recommend the following healthy lifestyle measures: - eat a healthy diet consisting of lots of vegetables, fruits, beans, nuts, seeds, healthy meats such as white chicken and fish and whole grains.  - avoid fried foods, fast food, processed foods, sodas, red meet and other fattening foods.  - get a least 150 minutes of aerobic exercise per week.   Skin check with your dermatology

## 2014-02-17 NOTE — Progress Notes (Signed)
Pre visit review using our clinic review tool, if applicable. No additional management support is needed unless otherwise documented below in the visit note. 

## 2014-02-17 NOTE — Progress Notes (Signed)
No chief complaint on file.   HPI:  Here for CPE:  -Concerns and/or follow up today: none  -Diet: variety of foods, balance and well rounded  -Exercise: yoga once per week, she is going to start going back to gym  -Taking folic acid, vitamin D or calcium: taking calcium and vit d  -Diabetes and Dyslipidemia Screening:done  -Hx of HTN: no  -Vaccines: UTD  -pap history: does this with Dr. Helane Rima - done last week  -FDLMP: n/a  -sexual activity: yes, female partner, no new partners  -wants STI testing: no  -FH breast, colon or ovarian ca: see FH Last mammogram: July, followed by - followed by Dr. Hilaria Ota Last colon cancer screening:  UTD  DEXA (>/= 22): last year, followed by Dr. Runell Gess, she will discuss with Dr. Cruzita Lederer   -Alcohol, Tobacco, drug use: see social history  Review of Systems - no fevers, unintentional weight loss, vision loss, hearing loss, chest pain, sob, hemoptysis, melena, hematochezia, hematuria, genital discharge, changing or concerning skin lesions, bleeding, bruising, loc, thoughts of self harm or SI  Past Medical History  Diagnosis Date  . Nephrolithiasis     hx of  . IBS (irritable bowel syndrome)   . Hyperlipidemia   . Breast cancer     hx of with rediation, surgery and adjuvant theapy with tamoxifen  (year two)  . Hypothyroidism 09-27-11    Low function  . Sleep apnea 09-27-11    Cpap used ,started 2'2011  . Arthritis 09-27-11    Right knee/Cortisone injection 2'13  . Acute medial meniscal tear 10/02/2011  . OSTEOARTHROS UNSPEC WHETHER GEN/LOC [facility] SITE 02/28/2009    Qualifier: Diagnosis of  By: Arnoldo Morale MD, [place], HX OF 11/26/2006    Qualifier: Diagnosis of  By: Jimmye Norman LPN, Winfield Cunas     Past Surgical History  Procedure Laterality Date  . Tonsillectomy    . Myomectomy  1990  . Kidney stones  1977  . Cataract extraction  09-27-11    Bilateral  . Breast biopsy  1977    right  . Breast lumpectomy  2006    raditon 33  tx-right  . Knee arthroscopy  10/02/2011    Procedure: ARTHROSCOPY KNEE;  Surgeon: Gearlean Alf, MD;  Location: [facility];  Service: Orthopedics;  Laterality: Right;  with Debridement, right knee medial meniscus    Family History  Problem Relation Age of Onset  . Hypertension Mother   . Diabetes Mother   . Cancer Mother     uterine  . Colon cancer Neg Hx     History   Social History  . Marital Status: Married    Spouse Name: N/A    Number of Children: N/A  . Years of Education: N/A   Social History Main Topics  . Smoking status: Never Smoker   . Smokeless tobacco: Never Used  . Alcohol Use: Yes     Comment: rare occ  . Drug Use: No  . Sexual Activity: Yes   Other Topics Concern  . 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 1000/500, take one by mouth daily, Disp: , Rfl: ;  Cholecalciferol (VITAMIN D-3 PO), Take 5,000 Units by mouth daily., Disp: , Rfl:   EXAM:  Filed Vitals:   02/17/14 0813  BP: 110/80  Pulse: 74  Temp: 97.9 F (36.6 C)    GENERAL: vitals reviewed and listed below, alert, oriented, appears well hydrated and in no acute distress  HEENT: head atraumatic, PERRLA, normal appearance of eyes, ears, nose and mouth. moist mucus membranes.  NECK: supple, no masses or lymphadenopathy  LUNGS: clear to auscultation bilaterally, no rales, rhonchi or wheeze  CV: HRRR, no peripheral edema or cyanosis, normal pedal pulses  BREAST: declined  ABDOMEN: bowel sounds normal, soft, non tender to palpation, no masses, no rebound or guarding  GU:  declined  RECTAL: refused  SKIN: no rash or abnormal lesions  MS: normal gait, moves all extremities normally  NEURO: CN II-XII grossly intact, normal muscle strength and sensation to light touch on extremities  PSYCH: normal affect, pleasant and cooperative  ASSESSMENT AND PLAN:  Discussed the following assessment and plan:  Need for prophylactic vaccination and inoculation against influenza - Plan: Flu Vaccine QUAD 36+ mos PF IM (Fluarix Quad PF)  Visit for preventive health examination  -Discussed and advised all [place] preventive services health task force level A and B recommendations for age, sex and risks.  -Advised at least 150 minutes of exercise per week and a healthy diet low in saturated fats and sweets and consisting of fresh fruits and vegetables, lean meats such as fish and white chicken and whole grains.  -labs, studies and vaccines per orders this encounter  Orders Placed This Encounter  Procedures  . Flu Vaccine QUAD 36+ mos PF IM (Fluarix Quad PF)    Patient advised to return to clinic immediately if symptoms worsen or persist or new concerns.  Patient Instructions  -Vit D3 [phone] IU; 1200mg  daily of calcium  We recommend the following healthy lifestyle measures: - eat a healthy diet consisting of lots of vegetables, fruits, beans, nuts, seeds, healthy meats such as white chicken and fish and whole grains.  - avoid fried foods, fast food, processed foods, sodas, red meet and other fattening foods.  - get a least 150 minutes of aerobic exercise per week.   Skin check with your dermatology      Return in about 1 year (around 02/18/2015), or if symptoms worsen or fail to improve, for CPE.  Colin Benton R.

## 2014-03-01 ENCOUNTER — Ambulatory Visit (INDEPENDENT_AMBULATORY_CARE_PROVIDER_SITE_OTHER): Payer: BC Managed Care – PPO | Admitting: Internal Medicine

## 2014-03-01 ENCOUNTER — Encounter: Payer: Self-pay | Admitting: Internal Medicine

## 2014-03-01 VITALS — BP 122/68 | HR 77 | Temp 98.7°F | Resp 12 | Wt 223.0 lb

## 2014-03-01 DIAGNOSIS — E039 Hypothyroidism, unspecified: Secondary | ICD-10-CM

## 2014-03-01 LAB — T3, FREE: T3, Free: 3.4 pg/mL (ref 2.3–4.2)

## 2014-03-01 LAB — T4, FREE: FREE T4: 0.73 ng/dL (ref 0.60–1.60)

## 2014-03-01 LAB — TSH: TSH: 1.62 u[IU]/mL (ref 0.35–4.50)

## 2014-03-01 NOTE — Progress Notes (Signed)
Patient ID: Jacqueline Jennings, female   DOB: [birth year], 63 y.o.   MRN: [id number]   HPI  Jacqueline Jennings is a 63 y.o.-year-old female, returning for follow-up for uncontrolled hypothyroidism. Last visit 3 months ago.  She was dx with onycholysis >> started on Clobetasol cream.  Reviewed history: Pt. has been dx with hypothyroidism in 2007-2008; started on Synthroid >> not feeling better >> switched on Armour, taken - fasting - with water - separated by >30 min from b'fast  - takes calcium - with b'fast - no iron, PPIs, multivitamins   Patient has a recent use of iodine supplements - started using Kelp 6 mo before last visit.  She did stop 3 months ago.  She was initially on a high dose of Armour: 120 mg, which we subsequently decreased to 90 mg, and then further to 60 mg.  Her TSH was overly suppressed in the last 3 years, as shown below.   Revieweing her TFTs, she has been thyrotoxic since 2012: Lab Results  Component Value Date   TSH 0.03* 01/12/2014   TSH 0.05* 11/23/2013   TSH 0.05* 02/01/2013   TSH 0.10* 05/26/2012   TSH 0.04* 02/20/2012   TSH 0.04* 09/03/2011   TSH 0.10* 04/18/2011   TSH 1.33 01/14/2011   TSH 1.68 07/05/2010   TSH 1.06 10/12/2009   FREET4 0.77 01/12/2014   FREET4 1.02 11/23/2013   FREET4 0.89 05/26/2012   FREET4 0.92 02/20/2012   FREET4 0.87 04/18/2011   FREET4 0.76 01/14/2011   FREET4 0.72 10/12/2009   FREET4 0.9 08/10/2009   FREET4 0.7 06/16/2008    Pt describes: - no fatigue - no weight gain - + heat intolerance (+ hot flushes) - no constipation - no dry skin - no hair falling - no diarrhea - no depression/no anxiety  Pt denies feeling nodules in neck, hoarseness, dysphagia/odynophagia, SOB with lying down.  She also has a history of BrCa 2006 >> RxTx to R Breast.    ROS: Constitutional: see HPI Eyes: no blurry vision, no xerophthalmia ENT: no sore throat, no nodules palpated in throat, no dysphagia/odynophagia, no  hoarseness Cardiovascular: no CP/SOB/palpitations/leg swelling Respiratory: no cough/SOB Gastrointestinal: no N/V/D/C Musculoskeletal: no muscle/joint aches Skin: no rashes, no easy bruising, no hair loss Neurological: no tremors/numbness/tingling/dizziness  I reviewed pt's medications, allergies, PMH, social hx, family hx and no changes required, except as mentioned above. She stopped calcium and increased vitamin D.   PE: BP 122/68 mmHg  Pulse 77  Temp(Src) 98.7 F (37.1 C) (Oral)  Resp 12  Wt 223 lb (101.152 kg)  SpO2 96% Wt Readings from Last 3 Encounters:  03/01/14 223 lb (101.152 kg)  02/17/14 224 lb 1.6 oz (101.651 kg)  11/29/13 219 lb (99.338 kg)   Constitutional: obese, in NAD Eyes: PERRLA, EOMI, no exophthalmos ENT: moist mucous membranes, no thyromegaly, no cervical lymphadenopathy Cardiovascular: RRR, No MRG Respiratory: CTA B Gastrointestinal: abdomen soft, NT, ND, BS+ Musculoskeletal: no deformities, strength intact in all 4 Skin: palmar hyperhydrosis, warm, no rashes Neurological: no tremor with outstretched hands, DTR normal in all 4  ASSESSMENT: 1. Hypothyroidism - over replaced with Armour  PLAN:  1. Patient with long-standing hypothyroidism, on Armour therapy. She appears euthyroid. She does not appear to have a goiter, thyroid nodules, or neck compression symptoms - we reviewed together all her thyroid labs - we discussed about the risks of overreplacement with thyroid hormones: arrhythmia, hypercoagulability, osteoporosis. She also now has onycholysis, which can be associated with thyrotoxycosis -  will check thyroid tests today: TSH, free T4, free t3 - we may need to decrease Armour dose to 45 mg (we will need to alternate 60 and 30 mg Armour)  - she will continue to continued to stay off kelp - We again discussed about correct intake of thyroid hormones, fasting, with water, separated by at least 30 minutes from breakfast, and separated by more than 4  hours from calcium, iron, multivitamins, acid reflux medications (PPIs). We moved calcium at lunch at last visit. -  I will see her back in 6 months  Office Visit on 03/01/2014  Component Date Value Ref Range Status  . TSH 03/01/2014 1.62  0.35 - 4.50 uIU/mL Final  . Free T4 03/01/2014 0.73  0.60 - 1.60 ng/dL Final  . T3, Free 03/01/2014 3.4  2.3 - 4.2 pg/mL Final   Tests finally normal. Continue Armour 60 mg daily. Will recheck labs when she comes back in 6 mo.

## 2014-03-01 NOTE — Patient Instructions (Signed)
Please stop at the lab.  Please come back for a follow-up appointment in 6 months.  

## 2014-04-04 ENCOUNTER — Encounter: Payer: Self-pay | Admitting: Internal Medicine

## 2014-04-05 ENCOUNTER — Other Ambulatory Visit: Payer: Self-pay | Admitting: Internal Medicine

## 2014-04-05 DIAGNOSIS — E039 Hypothyroidism, unspecified: Secondary | ICD-10-CM

## 2014-04-05 MED ORDER — NATURE-THROID 65 MG PO TABS: 65.0000 mg | ORAL_TABLET | Freq: Every day | ORAL | Status: DC

## 2014-05-23 ENCOUNTER — Encounter: Payer: Self-pay | Admitting: Internal Medicine

## 2014-05-24 ENCOUNTER — Other Ambulatory Visit (INDEPENDENT_AMBULATORY_CARE_PROVIDER_SITE_OTHER): Payer: BC Managed Care – PPO

## 2014-05-24 DIAGNOSIS — E039 Hypothyroidism, unspecified: Secondary | ICD-10-CM

## 2014-05-24 LAB — TSH: TSH: 1.67 u[IU]/mL (ref 0.35–4.50)

## 2014-05-24 LAB — T3, FREE: T3 FREE: 4 pg/mL (ref 2.3–4.2)

## 2014-05-24 LAB — T4, FREE: Free T4: 0.79 ng/dL (ref 0.60–1.60)

## 2014-05-26 ENCOUNTER — Other Ambulatory Visit: Payer: Self-pay | Admitting: Internal Medicine

## 2014-05-26 DIAGNOSIS — E039 Hypothyroidism, unspecified: Secondary | ICD-10-CM

## 2014-05-26 MED ORDER — NATURE-THROID 65 MG PO TABS: 65.0000 mg | ORAL_TABLET | Freq: Every day | ORAL | Status: DC

## 2014-06-05 IMAGING — CR DG CHEST 2V
2 series · 2 of 2 positions shown · non-contrast
Comparison: 11/09/2004

CLINICAL DATA: Chronic cough.

EXAM:
CHEST  2 VIEW

[view not recorded (1 of 2)]
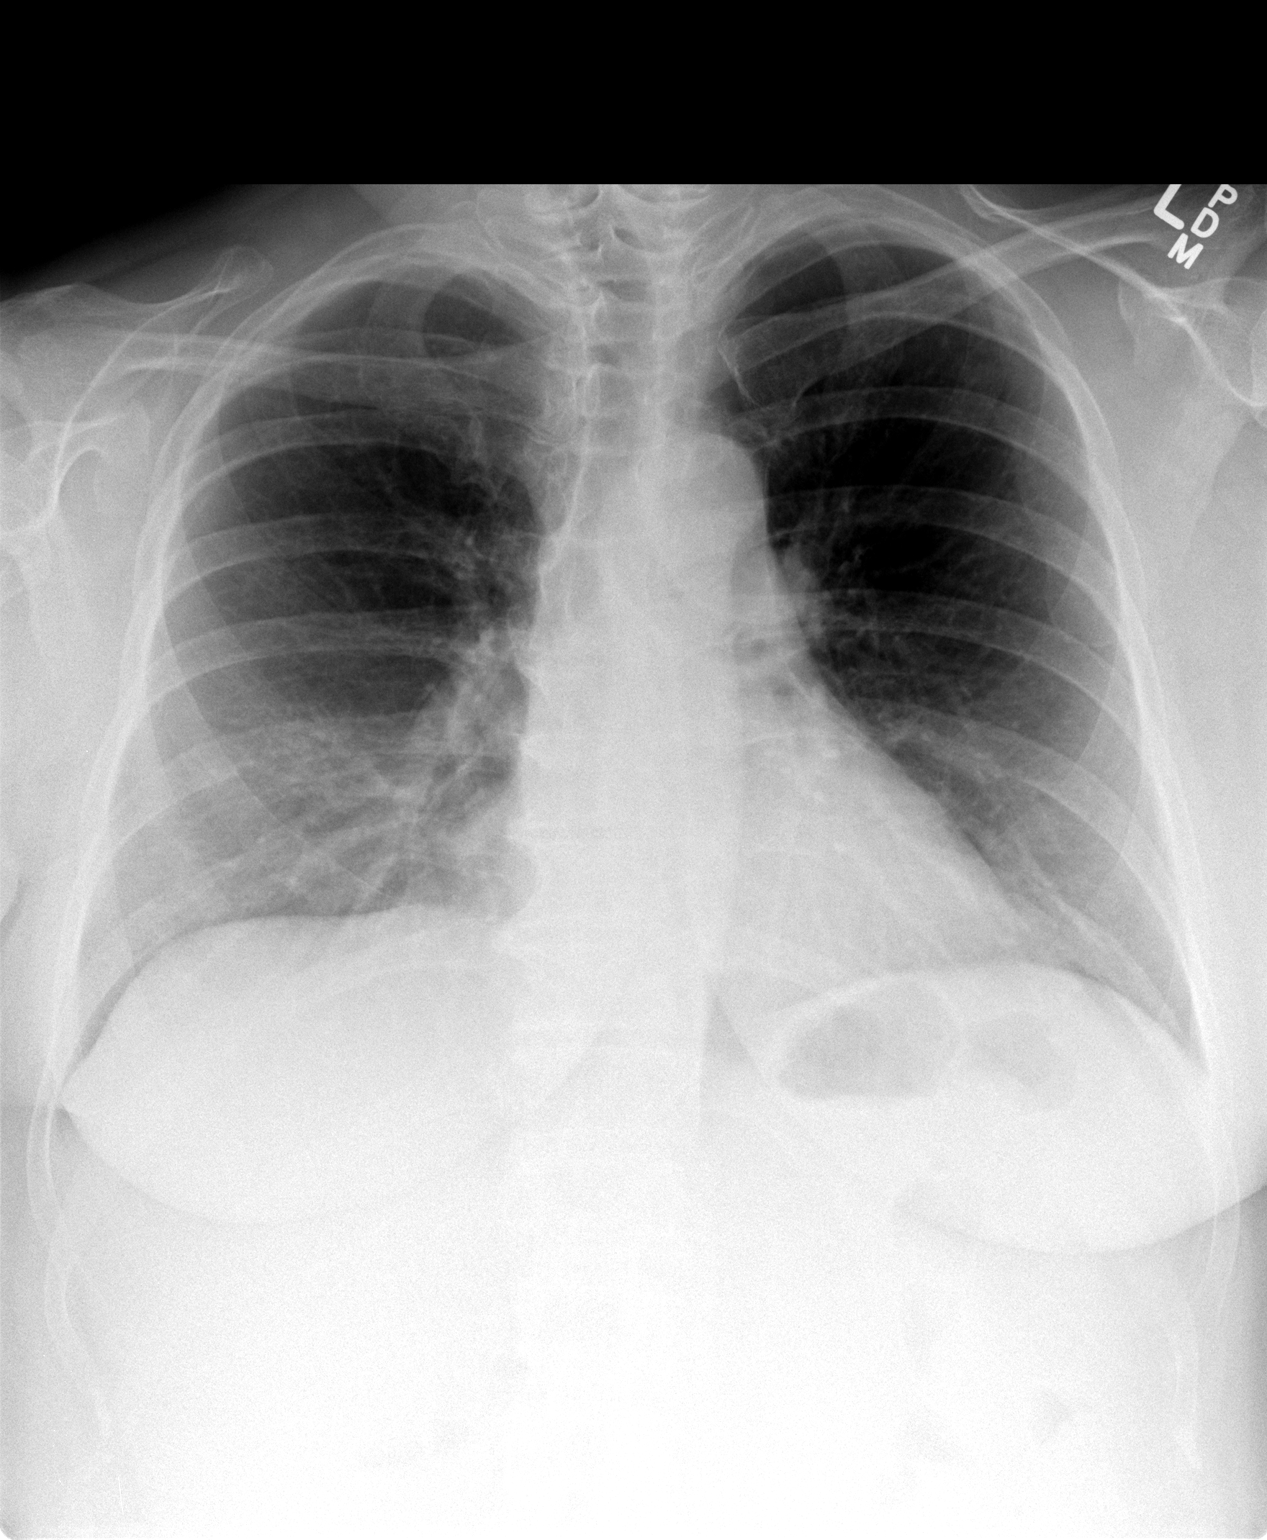

[view not recorded (2 of 2)]
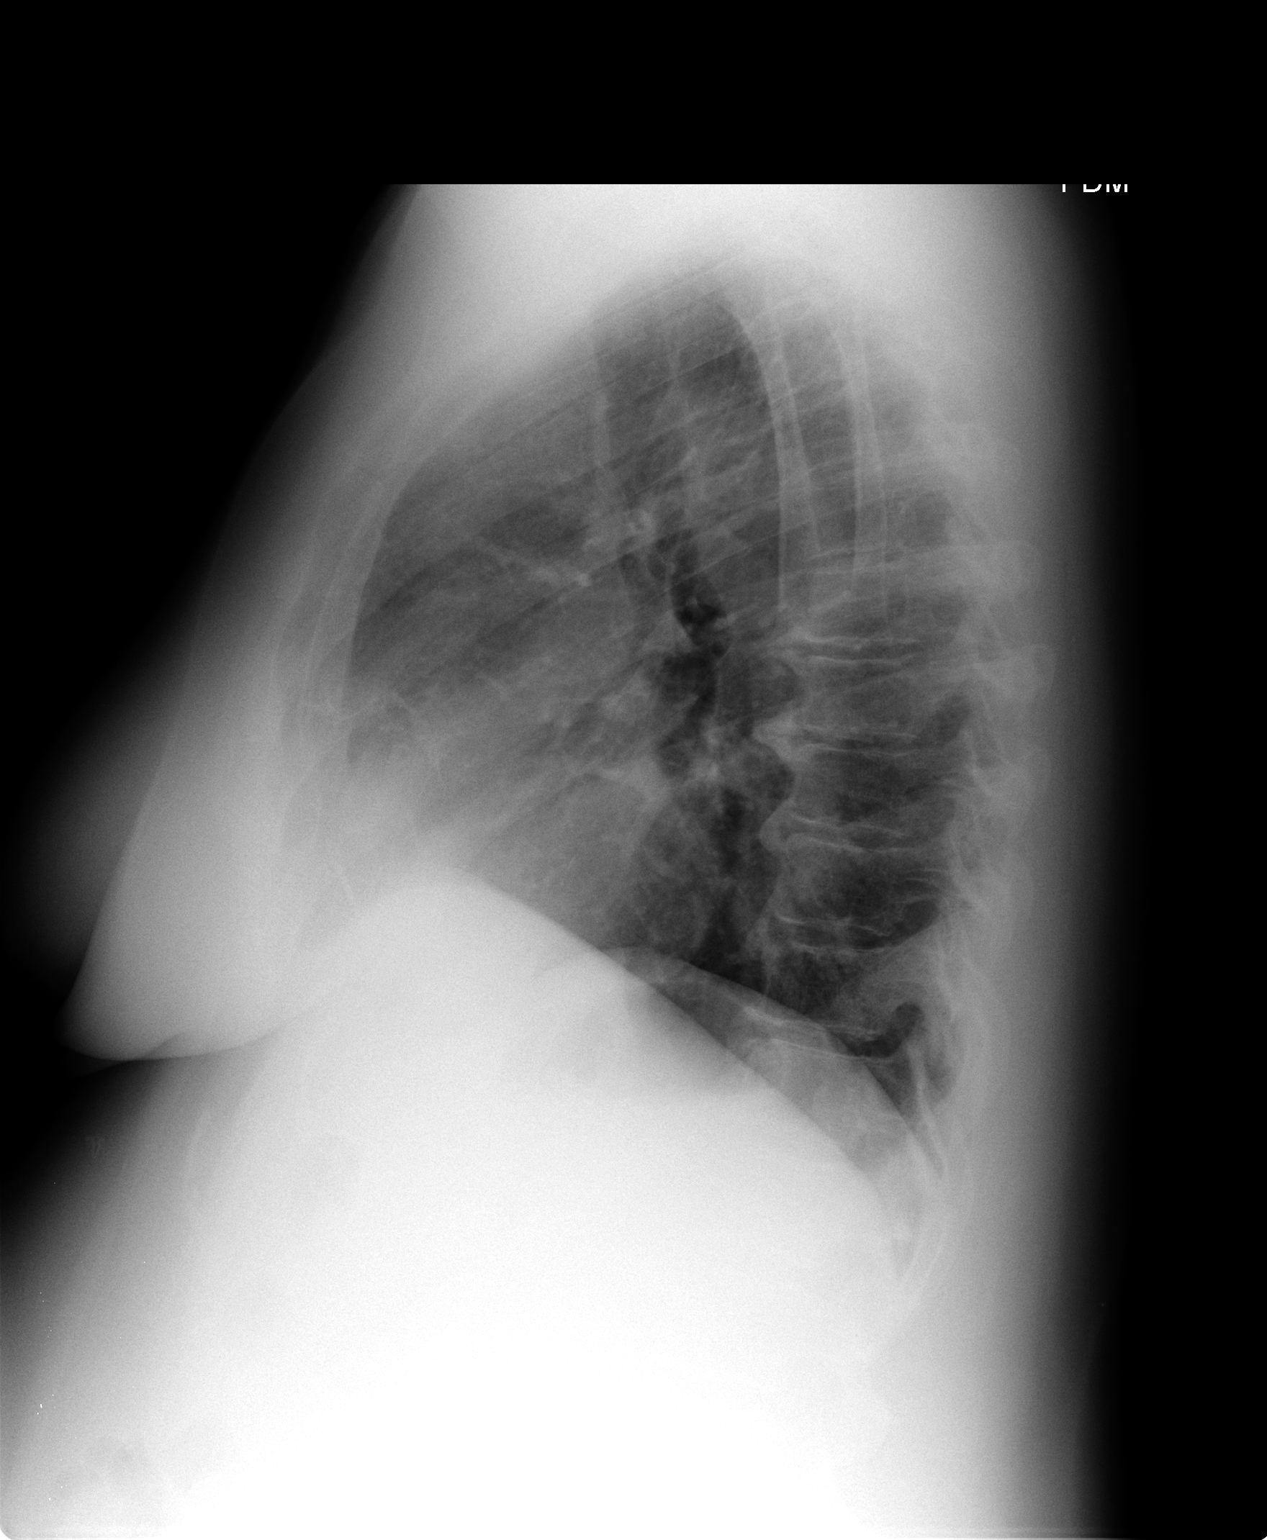

[2 of 2 positions shown; findings below may reference images not displayed]

FINDINGS: Cardiac silhouette is normal in size. Mediastinum is normal in
contour and caliber. Clear lungs. No pleural effusion or
pneumothorax.

Bony thorax is demineralized but intact.
IMPRESSION: No active cardiopulmonary disease.

## 2014-06-28 ENCOUNTER — Ambulatory Visit: Payer: Self-pay | Admitting: Family Medicine

## 2014-06-28 ENCOUNTER — Encounter: Payer: Self-pay | Admitting: Internal Medicine

## 2014-06-28 ENCOUNTER — Ambulatory Visit (INDEPENDENT_AMBULATORY_CARE_PROVIDER_SITE_OTHER): Payer: BLUE CROSS/BLUE SHIELD | Admitting: Internal Medicine

## 2014-06-28 DIAGNOSIS — R300 Dysuria: Secondary | ICD-10-CM

## 2014-06-28 LAB — POCT URINALYSIS DIPSTICK
Bilirubin, UA: NEGATIVE
GLUCOSE UA: NEGATIVE
Ketones, UA: NEGATIVE
NITRITE UA: NEGATIVE
PROTEIN UA: NEGATIVE
Spec Grav, UA: 1.01
UROBILINOGEN UA: 0.2
pH, UA: 6.5

## 2014-06-28 MED ORDER — CIPROFLOXACIN HCL 500 MG PO TABS: 500.0000 mg | ORAL_TABLET | Freq: Two times a day (BID) | ORAL | Status: DC

## 2014-06-28 NOTE — Progress Notes (Signed)
Pre visit review using our clinic review tool, if applicable. No additional management support is needed unless otherwise documented below in the visit note. 

## 2014-06-28 NOTE — Patient Instructions (Addendum)
Drink as much fluid as you  can tolerate over the next few days  Take your antibiotic as prescribed until ALL of it is gone, but stop if you develop a rash, swelling, or any side effects of the medication.  Contact our office as soon as possible if  there are side effects of the medication.Urinary Tract Infection Urinary tract infections (UTIs) can develop anywhere along your urinary tract. Your urinary tract is your body's drainage system for removing wastes and extra water. Your urinary tract includes two kidneys, two ureters, a bladder, and a urethra. Your kidneys are a pair of bean-shaped organs. Each kidney is about the size of your fist. They are located below your ribs, one on each side of your spine. CAUSES Infections are caused by microbes, which are microscopic organisms, including fungi, viruses, and bacteria. These organisms are so small that they can only be seen through a microscope. Bacteria are the microbes that most commonly cause UTIs. SYMPTOMS  Symptoms of UTIs may vary by age and gender of the patient and by the location of the infection. Symptoms in young women typically include a frequent and intense urge to urinate and a painful, burning feeling in the bladder or urethra during urination. Older women and men are more likely to be tired, shaky, and weak and have muscle aches and abdominal pain. A fever may mean the infection is in your kidneys. Other symptoms of a kidney infection include pain in your back or sides below the ribs, nausea, and vomiting. DIAGNOSIS To diagnose a UTI, your caregiver will ask you about your symptoms. Your caregiver also will ask to provide a urine sample. The urine sample will be tested for bacteria and white blood cells. White blood cells are made by your body to help fight infection. TREATMENT  Typically, UTIs can be treated with medication. Because most UTIs are caused by a bacterial infection, they usually can be treated with the use of antibiotics.  The choice of antibiotic and length of treatment depend on your symptoms and the type of bacteria causing your infection. HOME CARE INSTRUCTIONS  If you were prescribed antibiotics, take them exactly as your caregiver instructs you. Finish the medication even if you feel better after you have only taken some of the medication.  Drink enough water and fluids to keep your urine clear or pale yellow.  Avoid caffeine, tea, and carbonated beverages. They tend to irritate your bladder.  Empty your bladder often. Avoid holding urine for long periods of time.  Empty your bladder before and after sexual intercourse.  After a bowel movement, women should cleanse from front to back. Use each tissue only once. SEEK MEDICAL CARE IF:   You have back pain.  You develop a fever.  Your symptoms do not begin to resolve within 3 days. SEEK IMMEDIATE MEDICAL CARE IF:   You have severe back pain or lower abdominal pain.  You develop chills.  You have nausea or vomiting.  You have continued burning or discomfort with urination. MAKE SURE YOU:   Understand these instructions.  Will watch your condition.  Will get help right away if you are not doing well or get worse. Document Released: 01/16/2005 Document Revised: 10/08/2011 Document Reviewed: 05/17/2011 [facility] Patient Information 2015 [place], [place]. This information is not intended to replace advice given to you by your health care provider. Make sure you discuss any questions you have with your health care provider.

## 2014-06-28 NOTE — Progress Notes (Signed)
Subjective:    Patient ID: Jacqueline Jennings, female    DOB: 1950/10/30, 64 y.o.   MRN: [id number]  HPI 64 year old patient who presents with a 2 day history of urinary frequency and dysuria.  She has had UTIs in the past occasionally with very similar complaints.  She has been forcing fluids.  Onset of significant dysuria only today. Urinalysis was reviewed and showed only mild  pyuria.  No fever, chills or flank pain.  Past Medical History  Diagnosis Date  . Nephrolithiasis     hx of  . IBS (irritable bowel syndrome)   . Hyperlipidemia   . Breast cancer     hx of with rediation, surgery and adjuvant theapy with tamoxifen  (year two)  . Hypothyroidism 09-27-11    Low function  . Sleep apnea 09-27-11    Cpap used ,started 2'2011  . Arthritis 09-27-11    Right knee/Cortisone injection 2'13  . Acute medial meniscal tear 10/02/2011  . OSTEOARTHROS UNSPEC WHETHER GEN/LOC [facility] SITE 02/28/2009    Qualifier: Diagnosis of  By: Arnoldo Morale MD, [place], HX OF 11/26/2006    Qualifier: Diagnosis of  By: Jimmye Norman LPN, [place]     History   Social History  . Marital Status: Married    Spouse Name: N/A  . Number of Children: N/A  . Years of Education: N/A   Occupational History  . Not on file.   Social History Main Topics  . Smoking status: Never Smoker   . Smokeless tobacco: Never Used  . Alcohol Use: Yes     Comment: rare occ  . Drug Use: No  . Sexual Activity: Yes   Other Topics Concern  . Not on file   Social History Narrative   Work or School: retired Education officer, museum, travels a lot      Home Situation: lives with husband      Spiritual Beliefs: Christian      Lifestyle: no regular exercise, healthy diet             Past Surgical History  Procedure Laterality Date  . Tonsillectomy    . Myomectomy  1990  . Kidney stones  1977  . Cataract extraction  09-27-11    Bilateral  . Breast biopsy  1977    right  . Breast lumpectomy  2006    raditon 33  tx-right  . Knee arthroscopy  10/02/2011    Procedure: ARTHROSCOPY KNEE;  Surgeon: Gearlean Alf, MD;  Location: [facility];  Service: Orthopedics;  Laterality: Right;  with Debridement, right knee medial meniscus    Family History  Problem Relation Age of Onset  . Hypertension Mother   . Diabetes Mother   . Cancer Mother     uterine  . Colon cancer Neg Hx     Allergies  Allergen Reactions  . Nabumetone     REACTION: Rash,Hives  . Latex Rash    Adhesives products-causes reddness, rash    Current Outpatient Prescriptions on File Prior to Visit  Medication Sig Dispense Refill  . Cholecalciferol (VITAMIN D-3 PO) Take 5,000 Units by mouth daily.    . clobetasol (TEMOVATE) 0.05 % external solution Apply 1 application topically 2 (two) times daily.     Marland Kitchen NATURE-THROID 65 MG tablet Take 1 tablet (65 mg total) by mouth daily. 90 tablet 1   No current facility-administered medications on file prior to visit.    BP 120/78 mmHg  Pulse 72  Temp(Src)  97.9 F (36.6 C) (Oral)  Resp 20  Ht 5\' 7"  (1.702 m)  Wt 225 lb (102.059 kg)  BMI 35.23 kg/m2  SpO2 98%       Review of Systems  Constitutional: Negative.   HENT: Negative for congestion, dental problem, hearing loss, rhinorrhea, sinus pressure, sore throat and tinnitus.   Eyes: Negative for pain, discharge and visual disturbance.  Respiratory: Negative for cough and shortness of breath.   Cardiovascular: Negative for chest pain, palpitations and leg swelling.  Gastrointestinal: Negative for nausea, vomiting, abdominal pain, diarrhea, constipation, blood in stool and abdominal distention.  Genitourinary: Positive for dysuria and frequency. Negative for urgency, hematuria, flank pain, vaginal bleeding, vaginal discharge, difficulty urinating, vaginal pain and pelvic pain.  Musculoskeletal: Negative for joint swelling, arthralgias and gait problem.  Skin: Negative for rash.  Neurological: Negative for dizziness, syncope, speech  difficulty, weakness, numbness and headaches.  Hematological: Negative for adenopathy.  Psychiatric/Behavioral: Negative for behavioral problems, dysphoric mood and agitation. The patient is not nervous/anxious.        Objective:   Physical Exam  Constitutional: She appears well-developed and well-nourished. No distress.          Assessment & Plan:   Probable early UTI.  Urinalysis is fairly unimpressive, but symptoms impressive.  She has had prior UTIs with similar symptoms at the onset in the past.  Will treat with 3 days of Cipro

## 2014-07-05 ENCOUNTER — Telehealth: Payer: Self-pay | Admitting: *Deleted

## 2014-07-05 ENCOUNTER — Encounter: Payer: Self-pay | Admitting: *Deleted

## 2014-07-05 ENCOUNTER — Encounter: Payer: Self-pay | Admitting: Family Medicine

## 2014-07-05 NOTE — Telephone Encounter (Signed)
I called the pt and advised her of the message per Dr Maudie Mercury and she is aware the letter, sleep study and a list of numbers for [employer] and [employer] were left at the front desk for her to pick up and she agreed.

## 2014-07-18 ENCOUNTER — Encounter: Payer: Self-pay | Admitting: Family Medicine

## 2014-07-18 ENCOUNTER — Ambulatory Visit (INDEPENDENT_AMBULATORY_CARE_PROVIDER_SITE_OTHER): Payer: BLUE CROSS/BLUE SHIELD | Admitting: Family Medicine

## 2014-07-18 VITALS — BP 120/84 | HR 97 | Temp 98.2°F | Ht 67.0 in | Wt 222.9 lb

## 2014-07-18 DIAGNOSIS — J069 Acute upper respiratory infection, unspecified: Secondary | ICD-10-CM

## 2014-07-18 MED ORDER — AZITHROMYCIN 250 MG PO TABS: ORAL_TABLET | ORAL | Status: DC

## 2014-07-18 MED ORDER — HYDROCODONE-HOMATROPINE 5-1.5 MG/5ML PO SYRP: 5.0000 mL | ORAL_SOLUTION | Freq: Three times a day (TID) | ORAL | Status: DC | PRN

## 2014-07-18 NOTE — Progress Notes (Signed)
HPI:  URI: -started: last week -symptoms:nasal congestion, sore throat, cough - started with body aches but no fever, sinus pressure -denies:fever, SOB, NVD, tooth pain -has tried: nasal saline -sick contacts/travel/risks: denies flu exposure, tick exposure or or Ebola risks -Hx of: bad upper resp symptoms last year at this time of the year and reports saw ENT and allergies  OSA: -reports scheduled to see pulmonologist for this  ROS: See pertinent positives and negatives per HPI.  Past Medical History  Diagnosis Date  . Nephrolithiasis     hx of  . IBS (irritable bowel syndrome)   . Hyperlipidemia   . Breast cancer     hx of with rediation, surgery and adjuvant theapy with tamoxifen  (year two)  . Hypothyroidism 09-27-11    Low function  . Sleep apnea 09-27-11    Cpap used ,started 2'2011  . Arthritis 09-27-11    Right knee/Cortisone injection 2'13  . Acute medial meniscal tear 10/02/2011  . OSTEOARTHROS UNSPEC WHETHER GEN/LOC [facility] SITE 02/28/2009    Qualifier: Diagnosis of  By: Arnoldo Morale MD, [place], HX OF 11/26/2006    Qualifier: Diagnosis of  By: Jimmye Norman LPN, [place]     Past Surgical History  Procedure Laterality Date  . Tonsillectomy    . Myomectomy  1990  . Kidney stones  1977  . Cataract extraction  09-27-11    Bilateral  . Breast biopsy  1977    right  . Breast lumpectomy  2006    raditon 33 tx-right  . Knee arthroscopy  10/02/2011    Procedure: ARTHROSCOPY KNEE;  Surgeon: Gearlean Alf, MD;  Location: [facility];  Service: Orthopedics;  Laterality: Right;  with Debridement, right knee medial meniscus    Family History  Problem Relation Age of Onset  . Hypertension Mother   . Diabetes Mother   . Cancer Mother     uterine  . Colon cancer Neg Hx     History   Social History  . Marital Status: Married    Spouse Name: N/A  . Number of Children: N/A  . Years of Education: N/A   Social History Main Topics  . Smoking status: Never  Smoker   . Smokeless tobacco: Never Used  . Alcohol Use: Yes     Comment: rare occ  . Drug Use: No  . Sexual Activity: Yes   Other Topics Concern  . 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  Cholecalciferol (VITAMIN D-3 PO), Take 5,000 Units by mouth daily., Disp: , Rfl:  .  clobetasol (TEMOVATE) 0.05 % external solution, Apply 1 application topically 2 (two) times daily. , Disp: , Rfl:  .  NATURE-THROID 65 MG tablet, Take 1 tablet (65 mg total) by mouth daily., Disp: 90 tablet, Rfl: 1 .  azithromycin (ZITHROMAX) 250 MG tablet, 2 pills on the first day then 1 pill per day for 4 days, Disp: 6 tablet, Rfl: 0 .  HYDROcodone-homatropine (HYCODAN) 5-1.5 MG/5ML syrup, Take 5 mLs by mouth every 8 (eight) hours as needed for cough., Disp: 120 mL, Rfl: 0  EXAM:  Filed Vitals:   07/18/14 1314  BP:  120/84  Pulse: 97  Temp: 98.2 F (36.8 C)    Body mass index is 34.9 kg/(m^2).  GENERAL: vitals reviewed and listed above, alert, oriented, appears well hydrated and in no acute distress  HEENT: atraumatic, conjunttiva clear, no obvious abnormalities on inspection of external nose and ears, normal appearance of ear canals and TMs, clear nasal congestion, mild post oropharyngeal erythema with PND, no tonsillar edema or exudate, no sinus TTP  NECK: no obvious masses on inspection  LUNGS: rhoncerous breath sounds L base  CV: HRRR, no peripheral edema  MS: moves all extremities without noticeable abnormality  PSYCH: pleasant and cooperative, no obvious depression or anxiety  ASSESSMENT AND PLAN:  Discussed the following assessment and plan:  Acute upper respiratory infection - Plan: azithromycin (ZITHROMAX) 250 MG tablet, HYDROcodone-homatropine (HYCODAN) 5-1.5  MG/5ML syrup  -given HPI and exam findings today, a serious infection or illness is unlikely. We discussed potential etiologies, with VURI being most likely but with abnormal lung exam opted for abx in case CAP, and advised supportive care and monitoring. We discussed treatment side effects, likely course, antibiotic misuse, transmission, and signs of developing a serious illness. -discussed risks medications -of course, we advised to return or notify a doctor immediately if symptoms worsen or persist or new concerns arise.    There are no Patient Instructions on file for this visit.   Colin Benton R.

## 2014-07-18 NOTE — Progress Notes (Signed)
Pre visit review using our clinic review tool, if applicable. No additional management support is needed unless otherwise documented below in the visit note. 

## 2014-08-30 ENCOUNTER — Ambulatory Visit: Payer: BC Managed Care – PPO | Admitting: Internal Medicine

## 2014-09-08 ENCOUNTER — Ambulatory Visit (INDEPENDENT_AMBULATORY_CARE_PROVIDER_SITE_OTHER): Payer: BLUE CROSS/BLUE SHIELD | Admitting: Internal Medicine

## 2014-09-08 ENCOUNTER — Encounter: Payer: Self-pay | Admitting: Internal Medicine

## 2014-09-08 ENCOUNTER — Institutional Professional Consult (permissible substitution): Payer: BLUE CROSS/BLUE SHIELD | Admitting: Pulmonary Disease

## 2014-09-08 VITALS — BP 122/80 | HR 73 | Temp 98.3°F | Wt 219.0 lb

## 2014-09-08 DIAGNOSIS — E039 Hypothyroidism, unspecified: Secondary | ICD-10-CM

## 2014-09-08 LAB — TSH: TSH: 2.39 u[IU]/mL (ref 0.35–4.50)

## 2014-09-08 LAB — T4, FREE: Free T4: 0.69 ng/dL (ref 0.60–1.60)

## 2014-09-08 LAB — T3, FREE: T3, Free: 3.5 pg/mL (ref 2.3–4.2)

## 2014-09-08 MED ORDER — NATURE-THROID 65 MG PO TABS: 65.0000 mg | ORAL_TABLET | Freq: Every day | ORAL | Status: DC

## 2014-09-08 NOTE — Patient Instructions (Addendum)
Please stop at the lab.  Please come back for a follow-up appointment in 6 months.   Please consider the following ways to cut down carbs and fat and increase fiber and micronutrients in your diet:  - substitute whole grain for white bread or pasta - substitute brown rice for white rice - substitute 90-calorie flat bread pieces for slices of bread when possible - substitute sweet potatoes or yams for white potatoes - substitute humus for margarine - substitute tofu for cheese when possible - substitute almond or rice milk for regular milk (would not drink soy milk daily due to concern for soy estrogen influence on breast cancer risk) - substitute dark chocolate for other sweets when possible - substitute water - can add lemon or orange slices for taste - for diet sodas (artificial sweeteners will trick your body that you can eat sweets without getting calories and will lead you to overeating and weight gain in the long run) - do not skip breakfast or other meals (this will slow down the metabolism and will result in more weight gain over time)  - can try smoothies made from fruit and almond/rice milk in am instead of regular breakfast - can also try old-fashioned (not instant) oatmeal made with almond/rice milk in am - order the dressing on the side when eating salad at a restaurant (pour less than half of the dressing on the salad) - eat as little meat as possible - can try juicing, but should not forget that juicing will get rid of the fiber, so would alternate with eating raw veg./fruits or drinking smoothies - use as little oil as possible, even when using olive oil - can dress a salad with a mix of balsamic vinegar and lemon juice, for e.g. - use agave nectar, stevia sugar, or regular sugar rather than artificial sweateners - steam or broil/roast veggies  - snack on veggies/fruit/nuts (unsalted, preferably) when possible, rather than processed foods - reduce or eliminate aspartame in  diet (it is in diet sodas, chewing gum, etc) Read the labels!  Try to read Dr. Janene Harvey book: "Program for Reversing Diabetes" for the vegan concept and other ideas for healthy eating.  Plant-based diet materials: - Lectures (you tube):  Alyssa Grove: "Breaking the Food Seduction"  Doug Lisle: "How to Lose Weight, without Losing Your Mind"  Shari Heritage: "What is Insulin Resistance" https://www.woods-mathews.com/ - Documentaries:  [place] over [employer], Sick and Nearly Dead  The [employer]  Overweight and undernourished - Books:  Alyssa Grove: "Program for Reversing Diabetes"  Heath Gold: "The [place] Study"  Norma Fredrickson: "Supermarket Vegan" (cookbook) - Facebook pages:   Forks versus Knives  Vegucated  [place] [employer] - Healthy nutrition info websites:  https://www.martin.info/

## 2014-09-08 NOTE — Progress Notes (Signed)
Patient ID: Jacqueline Jennings, female   DOB: [birth year], 64 y.o.   MRN: [id number]   HPI  Jacqueline Jennings is a 64 y.o.-year-old female, returning for follow-up for uncontrolled hypothyroidism. Last visit 6 months ago.   Reviewed history: Pt. has been dx with hypothyroidism in 2007-2008; started on Synthroid >> not feeling better >> was on Armour 60 mg >> now nature thyroid 65 mg, taken - fasting - with water - separated by >30 min from b'fast  - takes calcium - with b'fast - no iron, PPIs, multivitamins   Patient is not on iodine supplements - was using Kelp, not recently  Her TSH was overly suppressed in thepast, as shown below. She was initially on a high dose of Armour: 120 mg, which we subsequently decreased to 90 mg, and then further to 60 mg >> NatureThroid 65. Labs normalized on this dose.  Revieweing her TFTs, she has been thyrotoxic 2012-2015: Lab Results  Component Value Date   TSH 1.67 05/24/2014   TSH 1.62 03/01/2014   TSH 0.03* 01/12/2014   TSH 0.05* 11/23/2013   TSH 0.05* 02/01/2013   TSH 0.10* 05/26/2012   TSH 0.04* 02/20/2012   TSH 0.04* 09/03/2011   TSH 0.10* 04/18/2011   TSH 1.33 01/14/2011   FREET4 0.79 05/24/2014   FREET4 0.73 03/01/2014   FREET4 0.77 01/12/2014   FREET4 1.02 11/23/2013   FREET4 0.89 05/26/2012   FREET4 0.92 02/20/2012   FREET4 0.87 04/18/2011   FREET4 0.76 01/14/2011   FREET4 0.72 10/12/2009   FREET4 0.9 08/10/2009    Pt describes: - + fatigue - + weight gain (not by our scale) - no heat intolerance - no constipation - no dry skin - no hair falling - no diarrhea - no depression/no anxiety  Pt denies feeling nodules in neck, hoarseness, dysphagia/odynophagia, SOB with lying down.  She also has a history of BrCa 2006 >> RxTx to R Breast.    ROS: Constitutional: see HPI Eyes: no blurry vision, no xerophthalmia ENT: no sore throat, no nodules palpated in throat, no dysphagia/odynophagia, no hoarseness Cardiovascular: no  CP/SOB/palpitations/leg swelling Respiratory: no cough/SOB Gastrointestinal: no N/V/D/C Musculoskeletal: no muscle/joint aches Skin: no rashes, no easy bruising, no hair loss Neurological: no tremors/numbness/tingling/dizziness  I reviewed pt's medications, allergies, PMH, social hx, family hx, and changes were documented in the history of present illness. Otherwise, unchanged from my initial visit note.  PE: BP 122/80 mmHg  Pulse 73  Temp(Src) 98.3 F (36.8 C) (Oral)  Wt 219 lb (99.338 kg)  SpO2 95% Body mass index is 34.29 kg/(m^2). Wt Readings from Last 3 Encounters:  09/08/14 219 lb (99.338 kg)  07/18/14 222 lb 14.4 oz (101.107 kg)  06/28/14 225 lb (102.059 kg)   Constitutional: obese, in NAD Eyes: PERRLA, EOMI, no exophthalmos ENT: moist mucous membranes, no thyromegaly, no cervical lymphadenopathy Cardiovascular: RRR, No MRG Respiratory: CTA B Gastrointestinal: abdomen soft, NT, ND, BS+ Musculoskeletal: no deformities, strength intact in all 4 Skin: palmar hyperhydrosis, warm, no rashes Neurological: no tremor with outstretched hands, DTR normal in all 4  ASSESSMENT: 1. Hypothyroidism - on desiccated thyroid extract  PLAN:  1. Patient with long-standing hypothyroidism, on NatureThroid 65 mg daily. She appears euthyroid, but complains of fatigue and weight gain (weight loss per our scale). She does not appear to have a goiter, thyroid nodules, or neck compression symptoms - we reviewed together all her thyroid labs >> normal 2 -  last set of labs, from February, was obtained on  the current dose of nature thyroid. I advised her that I think this is a good dose for her, and would not recommend changing the dose or the formulation. However, will check thyroid tests today: TSH, free T4, free t3 and we'll see further adjustments are needed - she will continue to continued to stay off kelp - she knows how to take her thyroid hormones correctly, fasting, with water, separated by  at least 30 minutes from breakfast, and separated by more than 4 hours from calcium, iron, multivitamins, acid reflux medications (PPIs). -  I will see her back in 6 months - She inquires about her fatigue. I advised her that I do not believe that this is from the thyroid at this point, with normal thyroid tests, however, she had iatrogenic thyrotoxicosis for 3 years in the past, and her body might be slowly recovering after this. I advised her to discuss with PCP of maybe checking her for anemia or vitamin deficiencies. She asked me my opinion about integrative medicine, and this is not about idea, however, I advised her to be careful not to increase her thyroid hormones based on her fatigue, but rather normal thyroid tests. We again discussed about the risks of overreplacement with thyroid hormones: arrhythmia, hypercoagulability, osteoporosis.  Return in about 6 months (around 03/11/2015).   Needs refills were results are back.  Office Visit on 09/08/2014  Component Date Value Ref Range Status  . TSH 09/08/2014 2.39  0.35 - 4.50 uIU/mL Final  . Free T4 09/08/2014 0.69  0.60 - 1.60 ng/dL Final  . T3, Free 09/08/2014 3.5  2.3 - 4.2 pg/mL Final   Labs are great!

## 2014-09-13 ENCOUNTER — Other Ambulatory Visit: Payer: Self-pay | Admitting: Family Medicine

## 2014-09-13 ENCOUNTER — Encounter: Payer: Self-pay | Admitting: Family Medicine

## 2014-09-13 DIAGNOSIS — R5382 Chronic fatigue, unspecified: Secondary | ICD-10-CM

## 2014-09-13 NOTE — Progress Notes (Unsigned)
Wendie Simmer,   See pt email - can you help her set up a lab appt? Thanks!

## 2014-09-14 ENCOUNTER — Other Ambulatory Visit (INDEPENDENT_AMBULATORY_CARE_PROVIDER_SITE_OTHER): Payer: BLUE CROSS/BLUE SHIELD

## 2014-09-14 DIAGNOSIS — R5382 Chronic fatigue, unspecified: Secondary | ICD-10-CM | POA: Diagnosis not present

## 2014-09-14 LAB — CBC
HEMATOCRIT: 42.2 % (ref 36.0–46.0)
HEMOGLOBIN: 14.1 g/dL (ref 12.0–15.0)
MCHC: 33.5 g/dL (ref 30.0–36.0)
MCV: 84.3 fl (ref 78.0–100.0)
Platelets: 320 10*3/uL (ref 150.0–400.0)
RBC: 5 Mil/uL (ref 3.87–5.11)
RDW: 14.8 % (ref 11.5–15.5)
WBC: 6.3 10*3/uL (ref 4.0–10.5)

## 2014-09-14 LAB — VITAMIN B12: Vitamin B-12: 210 pg/mL — ABNORMAL LOW (ref 211–911)

## 2014-09-14 NOTE — Progress Notes (Signed)
I left a detailed message at the pts home number for the pt to call the office to set up a lab appt at her convenience as the orders are already in the system.

## 2014-09-16 ENCOUNTER — Encounter: Payer: Self-pay | Admitting: Pulmonary Disease

## 2014-09-16 ENCOUNTER — Ambulatory Visit (INDEPENDENT_AMBULATORY_CARE_PROVIDER_SITE_OTHER): Payer: BLUE CROSS/BLUE SHIELD | Admitting: Pulmonary Disease

## 2014-09-16 DIAGNOSIS — G4733 Obstructive sleep apnea (adult) (pediatric): Secondary | ICD-10-CM | POA: Diagnosis not present

## 2014-09-16 NOTE — Assessment & Plan Note (Signed)
The patient has a history of mild obstructive sleep apnea by her sleep study in 2010, and has done very well with C Pap since that time. She has not had any type of follow-up since starting on C Pap, and comes in today to establish for regular follow-ups. She is very satisfied with her sleep and daytime alertness, and is having no issues with her mask fit or pressure. She is wanting to get a new C Pap device, and we will send the appropriate order to see if it is covered by insurance. I have asked her to keep up with her mask cushions and supplies, and to work aggressively on weight loss.

## 2014-09-16 NOTE — Patient Instructions (Signed)
Continue on cpap, and keep up with mask changes and supplies Will send an order for a new cpap device. Work on weight loss followup with Dr. Halford Chessman in one year if doing well.

## 2014-09-16 NOTE — Progress Notes (Signed)
Subjective:    Patient ID: Jacqueline Jennings, female    DOB: [birth year], 64 y.o.   MRN: [id number]  HPI The patient is a 64 year old female who comes in today as a self referral to establish for management of obstructive sleep apnea. He was diagnosed in 2010 with mild OSA, with an AHI of 11 events per hour. She was started on C Pap with nasal pillows, and is on the auto setting currently. She sleeps very well with her device, and her bed partner has not heard breakthrough snoring. She is rested in the mornings upon arising, and has no daytime sleepiness or issues with driving. Her weight over the years has been neutral, and her Epworth score today is only one. I have reviewed a download off her device today which shows excellent compliance, and no breakthrough apneas.   Sleep Questionnaire What time do you typically go to bed?( Between what hours) 10:00 10:00 at 1424 on 09/16/14 by Len Blalock, CMA How long does it take you to fall asleep? 30 minutes 30 minutes at 1424 on 09/16/14 by Len Blalock, CMA How many times during the night do you wake up? 0 0 at 1424 on 09/16/14 by Len Blalock, CMA What time do you get out of bed to start your day? 0630 0630 at 1424 on 09/16/14 by Len Blalock, CMA Do you drive or operate heavy machinery in your occupation? No No at 1424 on 09/16/14 by Len Blalock, CMA How much has your weight changed (up or down) over the past two years? (In pounds) 0 oz (0 kg) 0 oz (0 kg) at 1424 on 09/16/14 by Len Blalock, CMA Have you ever had a sleep study before? Yes Yes at 1424 on 09/16/14 by Len Blalock, CMA If yes, [facility] 1424 on 09/16/14 by Len Blalock, CMA If yes, date of study? 2010 2010 at 1424 on 09/16/14 by Len Blalock, CMA Do you currently use CPAP? Yes Yes at 1424 on 09/16/14 by Len Blalock, CMA If so, what pressure?  unknown unknown at 1424 on 09/16/14 by Len Blalock, CMA Do you wear oxygen at any time? No No at 1424 on 09/16/14 by Len Blalock, CMA   Review of Systems  Constitutional: Negative for fever and unexpected weight change.  HENT: Negative for congestion, dental problem, ear pain, nosebleeds, postnasal drip, rhinorrhea, sinus pressure, sneezing, sore throat and trouble swallowing.   Eyes: Negative for redness and itching.  Respiratory: Negative for cough, chest tightness, shortness of breath and wheezing.   Cardiovascular: Negative for palpitations and leg swelling.  Gastrointestinal: Negative for nausea and vomiting.  Genitourinary: Negative for dysuria.  Musculoskeletal: Negative for joint swelling.  Skin: Negative for rash.  Neurological: Negative for headaches.  Hematological: Does not bruise/bleed easily.  Psychiatric/Behavioral: Negative for dysphoric mood. The patient is not nervous/anxious.        Objective:   Physical Exam Constitutional:  Overweight female, no acute distress  HENT:  Nares patent without discharge  Oropharynx without exudate, small oral cavity,  palate and uvula are elongated  Eyes:  Perrla, eomi, no scleral icterus  Neck:  No JVD, no TMG  Cardiovascular:  Normal rate, regular rhythm, no rubs or gallops.  No murmurs        Intact distal pulses  Pulmonary :  Normal breath sounds, no stridor or respiratory distress   No rales, rhonchi, or  wheezing  Abdominal:  Soft, nondistended, bowel sounds present.  No tenderness noted.   Musculoskeletal:  minimal lower extremity edema noted.  Lymph Nodes:  No cervical lymphadenopathy noted  Skin:  No cyanosis noted  Neurologic:  Alert, appropriate, moves all 4 extremities without obvious deficit.         Assessment & Plan:

## 2014-09-22 ENCOUNTER — Telehealth: Payer: Self-pay | Admitting: Pulmonary Disease

## 2014-09-22 ENCOUNTER — Encounter: Payer: Self-pay | Admitting: Pulmonary Disease

## 2014-09-22 NOTE — Telephone Encounter (Signed)
Note    Needs new cpap machine   resmed s10 air/auto with h/h and climate control tubing. Set on auto 5-20cm, enroll in airview   ?different dme     Pt is aware of which machine [facility] placed order for; pt will reach out to [facility]. Nothing more needed at this time.

## 2014-09-22 NOTE — Telephone Encounter (Signed)
[facility] please advise. Thanks. 

## 2014-09-23 NOTE — Telephone Encounter (Signed)
Same as the machine I ordered for her.  There are minimal differences in the way the pressure is delivered.  Is really more of a publicity stunt than anything else to sell more machines to women.  She could use either.

## 2014-11-03 LAB — HM MAMMOGRAPHY

## 2014-11-07 ENCOUNTER — Encounter: Payer: Self-pay | Admitting: Family Medicine

## 2014-12-14 ENCOUNTER — Encounter: Payer: Self-pay | Admitting: Family Medicine

## 2014-12-14 ENCOUNTER — Ambulatory Visit (INDEPENDENT_AMBULATORY_CARE_PROVIDER_SITE_OTHER): Payer: BLUE CROSS/BLUE SHIELD | Admitting: Family Medicine

## 2014-12-14 VITALS — BP 118/82 | HR 91 | Temp 98.0°F | Ht 66.0 in | Wt 221.8 lb

## 2014-12-14 DIAGNOSIS — E538 Deficiency of other specified B group vitamins: Secondary | ICD-10-CM

## 2014-12-14 DIAGNOSIS — K120 Recurrent oral aphthae: Secondary | ICD-10-CM | POA: Diagnosis not present

## 2014-12-14 DIAGNOSIS — J019 Acute sinusitis, unspecified: Secondary | ICD-10-CM | POA: Diagnosis not present

## 2014-12-14 DIAGNOSIS — K589 Irritable bowel syndrome without diarrhea: Secondary | ICD-10-CM

## 2014-12-14 DIAGNOSIS — Z23 Encounter for immunization: Secondary | ICD-10-CM | POA: Diagnosis not present

## 2014-12-14 DIAGNOSIS — E669 Obesity, unspecified: Secondary | ICD-10-CM

## 2014-12-14 LAB — VITAMIN B12: Vitamin B-12: 280 pg/mL (ref 211–911)

## 2014-12-14 MED ORDER — TRIAMCINOLONE ACETONIDE 0.1 % MT PSTE: 1.0000 "application " | PASTE | Freq: Two times a day (BID) | OROMUCOSAL | Status: DC

## 2014-12-14 MED ORDER — AMOXICILLIN-POT CLAVULANATE 875-125 MG PO TABS: 1.0000 | ORAL_TABLET | Freq: Two times a day (BID) | ORAL | Status: DC

## 2014-12-14 NOTE — Progress Notes (Signed)
Pre visit review using our clinic review tool, if applicable. No additional management support is needed unless otherwise documented below in the visit note. 

## 2014-12-14 NOTE — Progress Notes (Signed)
HPI:  Acute visit for:  Ear Ache/Canker sores: -canker sores a few weeks ago and saw her dentist; she has had them on and off most of her life -saw her dentist about the canker sores -R ear pain started a week ago  -she has intermittent ear pain with 3 episode in 1 year and saw ENT and Allergist for this  -takes b12 -has IBS and sees Dr. Forrestine Him for this - thinks this is related -she thinks this is a bacterial infection and is frustrated has been told is likely viral and wants an antibiotic  Obesity/IBS: -she if frustrated with her weight and her IBS  -wants to see nutritionist - feels like healthy diet irritates er IBS -denies: fevers, blood in stools, ulcer on rectal area, melena, weight loss, malaise  Mild B12 def: -was supposed to do sl replacement but has not been taking often, wants to recheck   ROS: See pertinent positives and negatives per HPI.  Past Medical History  Diagnosis Date  . Nephrolithiasis     hx of  . IBS (irritable bowel syndrome)   . Hyperlipidemia   . Breast cancer     hx of with rediation, surgery and adjuvant theapy with tamoxifen  (year two)  . Hypothyroidism 09-27-11    Low function  . Sleep apnea 09-27-11    Cpap used ,started 2'2011  . Arthritis 09-27-11    Right knee/Cortisone injection 2'13  . Acute medial meniscal tear 10/02/2011  . OSTEOARTHROS UNSPEC WHETHER GEN/LOC [facility] SITE 02/28/2009    Qualifier: Diagnosis of  By: Arnoldo Morale MD, [place], HX OF 11/26/2006    Qualifier: Diagnosis of  By: Jimmye Norman LPN, [place] Cunas     Past Surgical History  Procedure Laterality Date  . Tonsillectomy    . Myomectomy  1990  . Kidney stones  1977  . Cataract extraction  09-27-11    Bilateral  . Breast biopsy  1977    right  . Breast lumpectomy  2006    raditon 33 tx-right  . Knee arthroscopy  10/02/2011    Procedure: ARTHROSCOPY KNEE;  Surgeon: Gearlean Alf, MD;  Location: [facility];  Service: Orthopedics;  Laterality: Right;  with  Debridement, right knee medial meniscus    Family History  Problem Relation Age of Onset  . Hypertension Mother   . Diabetes Mother   . Cancer Mother     uterine  . Colon cancer Neg Hx     Social History   Social History  . Marital Status: Married    Spouse Name: N/A  . Number of Children: N/A  . Years of Education: N/A   Social History Main Topics  . Smoking status: Never Smoker   . Smokeless tobacco: Never Used  . Alcohol Use: 0.0 oz/week    0 Standard drinks or equivalent per week     Comment: rare occ  . Drug Use: No  . Sexual Activity: Yes   Other Topics Concern  . 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  Cholecalciferol (VITAMIN D-3 PO), Take 5,000 Units by mouth daily., Disp: , Rfl:  .  Cyanocobalamin (VITAMIN B-12 PO),  Take by mouth daily., Disp: , Rfl:  .  NATURE-THROID 65 MG tablet, Take 1 tablet (65 mg total) by mouth daily., Disp: 90 tablet, Rfl: 1 .  amoxicillin-clavulanate (AUGMENTIN) 875-125 MG per tablet, Take 1 tablet by mouth 2 (two) times daily., Disp: 14 tablet, Rfl: 0 .  triamcinolone (KENALOG) 0.1 % paste, Use as directed 1 application in the mouth or throat 2 (two) times daily., Disp: 5 g, Rfl: 12  EXAM:  Filed Vitals:   12/14/14 0913  BP: 118/82  Pulse: 91  Temp: 98 F (36.7 C)    Body mass index is 35.82 kg/(m^2).  GENERAL: vitals reviewed and listed above, alert, oriented, appears well hydrated and in no acute distress  HEENT: atraumatic, conjunttiva clear, no obvious abnormalities on inspection of external nose and ears, normal appearance of ear canals and TMs except for clear effusion on R, clear nasal congestion, mild post oropharyngeal erythema with PND - one small canker sore under R tongue, no  tonsillar edema or exudate, no sinus TTP, mild crepitus R TMJ  NECK: no obvious masses on inspection, no LAD  LUNGS: clear to auscultation bilaterally, no wheezes, rales or rhonchi, good air movement  CV: HRRR, no peripheral edema  MS: moves all extremities without noticeable abnormality  PSYCH: pleasant and cooperative, no obvious depression or anxiety  ASSESSMENT AND PLAN:  Discussed the following assessment and plan:  Acute sinusitis, recurrence not specified, unspecified location - Plan: amoxicillin-clavulanate (AUGMENTIN) 875-125 MG per tablet  Canker sores oral - Plan: triamcinolone (KENALOG) 0.1 % paste  B12 deficiency - Plan: Vitamin B12  Irritable bowel syndrome  Obesity - Plan: Amb ref to Medical Nutrition Therapy-MNT  Needs flu shot - Plan: Flu Vaccine QUAD 36+ mos PF IM (Fluarix & Fluzone Quad PF)  -she has seen ENT and allergist about the R ear discomfort and is frustrated it recurred - discussed may potential causes with ETD from [place] given exam findings most likely - recs per instructions and f/u with ENT advised if persists, query ild R TMJ dysfuction as next most likely cause if persists -canker sores: discussed common and rare causes (bechets, etc), workup tx options, she seems very concerned about her bowel issues and this and while I d not think IBD is likely, advised she discuss with her GI doc. Discussed b12 def could be contributing - will recheck. Discussed and offered testing for other viral causes and unlikely- she declined. Kenalog orala paste provided. -referral to nutrition per her request, FODMAP diet provided -flu shot - she reported allergy to eggs to my staff, then told me she had allergy testing to eggs and this was negative, that she has never had a serioss reaction to eggs and that she has gotten flu shots ever year without issue -Patient advised to return or notify a doctor immediately if symptoms worsen or persist or new concerns arise.  Patient  Instructions  BEFORE YOU LEAVE: -FODMAP diet -labs  We placed a referral for you as discussed to the nutritionist. It usually takes about 1-2 weeks to process and schedule this referral. If you have not heard from [place] regarding this appointment in 2 weeks please contact our office.  Do the antibiotic  AFRIN twice daily for 3 days  Flonase 2 sprays each nostril daily for 21 days       Capri Raben R.

## 2014-12-14 NOTE — Patient Instructions (Addendum)
BEFORE YOU LEAVE: -FODMAP diet -labs  We placed a referral for you as discussed to the nutritionist. It usually takes about 1-2 weeks to process and schedule this referral. If you have not heard from [place] regarding this appointment in 2 weeks please contact our office.  Do the antibiotic  AFRIN twice daily for 3 days  Flonase 2 sprays each nostril daily for 21 days

## 2014-12-16 ENCOUNTER — Other Ambulatory Visit: Payer: BLUE CROSS/BLUE SHIELD

## 2014-12-19 ENCOUNTER — Telehealth: Payer: Self-pay | Admitting: Family Medicine

## 2014-12-19 NOTE — Telephone Encounter (Signed)
Should stop the augmentin. If very mild, no other symptoms and does not want to come in could change to doxy 100mg  bid for 6 days to complete abx course. Stay out of sun on this medication and document possible allery to aug in allergy portion of chart. Eval would probably be best to see if this is truly a reaction to document in chart. We have openings in the morning. Would def advise this unless mild. If any other symptoms such as sob, swelling of face or mouth or other signs of serious reaction should go to ED immediately.

## 2014-12-19 NOTE — Telephone Encounter (Signed)
Pt is having a rash associated with starting the Augmentin. It is on her feet and legs, right side is worse. Does she need a visit or can you change the rx?

## 2014-12-19 NOTE — Telephone Encounter (Signed)
I called the pt and she states she does not feel the rash she has is mild since it has not spread all over her body; however she denies any other symptoms.  She will discontinue the Augmentin and I scheduled the pt an appt to see Dr Maudie Mercury tomorrow at [facility] and she is aware to go to the ER if she develops any symptoms listed below.

## 2014-12-20 ENCOUNTER — Ambulatory Visit (INDEPENDENT_AMBULATORY_CARE_PROVIDER_SITE_OTHER): Payer: BLUE CROSS/BLUE SHIELD | Admitting: Family Medicine

## 2014-12-20 ENCOUNTER — Encounter: Payer: Self-pay | Admitting: Family Medicine

## 2014-12-20 VITALS — BP 118/80 | HR 82 | Temp 97.7°F | Ht 66.0 in | Wt 224.6 lb

## 2014-12-20 DIAGNOSIS — L270 Generalized skin eruption due to drugs and medicaments taken internally: Secondary | ICD-10-CM

## 2014-12-20 DIAGNOSIS — H9201 Otalgia, right ear: Secondary | ICD-10-CM

## 2014-12-20 DIAGNOSIS — K120 Recurrent oral aphthae: Secondary | ICD-10-CM

## 2014-12-20 NOTE — Progress Notes (Signed)
Pre visit review using our clinic review tool, if applicable. No additional management support is needed unless otherwise documented below in the visit note. 

## 2014-12-20 NOTE — Patient Instructions (Signed)
No more amoxicillin or penicillin   If oral lesion not resolve din 1 week, follow up with ear, nose and throat

## 2014-12-20 NOTE — Progress Notes (Signed)
HPI:  Acute visit for Rash: -started four days after starting augmentin for an upper rest infection -symptoms: rash on legs, arms, trunk - now improving after stopping abx yesterday -denies:SOB, swelling of face or neck, GI distress, fevers, malaise  Ear Pain: -R sided chronic -has seen several specialist for this and PND -report much better and all symptoms now resolved except for healing canker sore - much improved   ROS: See pertinent positives and negatives per HPI.  Past Medical History  Diagnosis Date  . Nephrolithiasis     hx of  . IBS (irritable bowel syndrome)   . Hyperlipidemia   . Breast cancer     hx of with rediation, surgery and adjuvant theapy with tamoxifen  (year two)  . Hypothyroidism 09-27-11    Low function  . Sleep apnea 09-27-11    Cpap used ,started 2'2011  . Arthritis 09-27-11    Right knee/Cortisone injection 2'13  . Acute medial meniscal tear 10/02/2011  . OSTEOARTHROS UNSPEC WHETHER GEN/LOC [facility] SITE 02/28/2009    Qualifier: Diagnosis of  By: Arnoldo Morale MD, [place], HX OF 11/26/2006    Qualifier: Diagnosis of  By: Jimmye Norman LPN, [place] Cunas     Past Surgical History  Procedure Laterality Date  . Tonsillectomy    . Myomectomy  1990  . Kidney stones  1977  . Cataract extraction  09-27-11    Bilateral  . Breast biopsy  1977    right  . Breast lumpectomy  2006    raditon 33 tx-right  . Knee arthroscopy  10/02/2011    Procedure: ARTHROSCOPY KNEE;  Surgeon: Gearlean Alf, MD;  Location: [facility];  Service: Orthopedics;  Laterality: Right;  with Debridement, right knee medial meniscus    Family History  Problem Relation Age of Onset  . Hypertension Mother   . Diabetes Mother   . Cancer Mother     uterine  . Colon cancer Neg Hx     Social History   Social History  . Marital Status: Married    Spouse Name: N/A  . Number of Children: N/A  . Years of Education: N/A   Social History Main Topics  . Smoking status: Never Smoker    . Smokeless tobacco: Never Used  . Alcohol Use: 0.0 oz/week    0 Standard drinks or equivalent per week     Comment: rare occ  . Drug Use: No  . Sexual Activity: Yes   Other Topics Concern  . 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  Cholecalciferol (VITAMIN D-3 PO), Take 5,000 Units by mouth daily., Disp: , Rfl:  .  Cyanocobalamin (VITAMIN B-12 PO), Take by mouth daily., Disp: , Rfl:  .  metroNIDAZOLE (METROGEL) 0.75 % gel, , Disp: , Rfl: 98 .  NATURE-THROID 65 MG tablet, Take 1 tablet (65 mg total) by mouth daily., Disp: 90 tablet, Rfl: 1 .  triamcinolone (KENALOG) 0.1 % paste, Use as directed 1 application in the mouth or throat 2 (two) times daily., Disp: 5 g, Rfl: 12  EXAM:  Filed Vitals:   12/20/14 0900  BP: 118/80  Pulse: 82  Temp: 97.7 F (  36.5 C)    Body mass index is 36.27 kg/(m^2).  GENERAL: vitals reviewed and listed above, alert, oriented, appears well hydrated and in no acute distress  HEENT: atraumatic, conjunttiva clear, no obvious abnormalities on inspection of external nose and ears, normal appearance of ear canals and TMs, clear nasal congestion, healing canker sore R lat tongu  NECK: no obvious masses on inspection  LUNGS: clear to auscultation bilaterally, no wheezes, rales or rhonchi, good air movement  CV: HRRR, no peripheral edema  SKIN: erythematous maculopapular rash on legs, truck arms  MS: moves all extremities without noticeable abnormality  PSYCH: pleasant and cooperative, no obvious depression or anxiety  ASSESSMENT AND PLAN:  Discussed the following assessment and plan:  Drug rash -resolving of abx, added to allergy list, return precautions, emergency precautions  Ear pain, right - resolved, advised if  recurrent start afrin for 3 days and INS immediately and if persists despite this possible MRI, follow up with ENT  Canker sore -healing, advised to use the cream (she had not tried this) if not gone in 1 week follow up with ENT or dentist/oral surgeon  -Patient advised to return or notify a doctor immediately if symptoms worsen or persist or new concerns arise.  Patient Instructions  No more amoxicillin or penicillin   If oral lesion not resolve din 1 week, follow up with ear, nose and throat     Taite Schoeppner R.

## 2015-01-30 ENCOUNTER — Ambulatory Visit: Payer: BLUE CROSS/BLUE SHIELD | Admitting: Skilled Nursing Facility1

## 2015-02-03 ENCOUNTER — Encounter: Payer: Self-pay | Admitting: Skilled Nursing Facility1

## 2015-02-03 ENCOUNTER — Encounter: Payer: BLUE CROSS/BLUE SHIELD | Attending: Family Medicine | Admitting: Skilled Nursing Facility1

## 2015-02-03 VITALS — Ht 66.0 in | Wt 221.0 lb

## 2015-02-03 DIAGNOSIS — E669 Obesity, unspecified: Secondary | ICD-10-CM

## 2015-02-03 DIAGNOSIS — Z713 Dietary counseling and surveillance: Secondary | ICD-10-CM | POA: Insufficient documentation

## 2015-02-03 DIAGNOSIS — Z6835 Body mass index (BMI) 35.0-35.9, adult: Secondary | ICD-10-CM | POA: Diagnosis not present

## 2015-02-03 NOTE — Patient Instructions (Signed)
-  1600 calories per day -180 grams of carbohydrates -120 grams of protein -44 grams of fat -This is going to be divided across 3 meals and 2-3 snacks every day -This plan plus daily physical activity: 3-4 days a week of 60 minutes and the rest of the week 30 minutes ( take your time getting back into the physical activity, so these are your end goals, slowly add in duration and intensity)  -15 grams per snack and about 25 grams of protein per meal (this is for 3 snacks) -15 grams of carbohydrate per snack and 45 grams of carbohydrates per meal -5 grams of fat for each snack and 9.6 grams of fat per each meal

## 2015-02-03 NOTE — Progress Notes (Signed)
  Medical Nutrition Therapy:  Appt start time: 0930 end time:  1030.   Assessment:  Primary concerns today: referred for obesity. Pt states she has been having difficulty losing weight. Pt states she has been doing weight watchers since her twenties. Pt states she got menopause since 10 years then she gained 30 pounds. Pt states she has always been struggling with weight before menopause. Pt states she is sensitive to gluten and dairy; symptoms of bloat and post nasal dripping. Pt states Soy bothers her too. Pt states she eats All organic all the time. Pt refused any education the dietitian offered and stated "I already know that." Pt states she only wants specific numbers in order for her to follow for each meal and snack, when dietitian attempted to educate the pt on standardized practice and why what she wants does not work the pt stated "I want the numbers", repeating.  Preferred Learning Style:   Want to learn nothing  Learning Readiness:   Not ready  MEDICATIONS: See List   DIETARY INTAKE:  Pt would not relate her meal pattern to dietitian.   Usual physical activity: walk 3 times a week for 45 minutes, 1 yoga class, 1 time oliptical 30 minutes  Estimated energy needs: 1600 calories 180 g carbohydrates 120 g protein 44 g fat  Progress Towards Goal(s):  In progress.   Nutritional Diagnosis:  No nutrition diagnosis at this time as the pt did not allow for much time of assessment.    Intervention:  A specific Nutrition plan. Goal: -1600 calories per day -180 grams of carbohydrates -120 grams of protein -44 grams of fat -This is going to be divided across 3 meals and 2-3 snacks every day -This plan plus daily physical activity: 3-4 days a week of 60 minutes and the rest of the week 30 minutes ( take your time getting back into the physical activity, so these are your end goals, slowly add in duration and intensity)  -15 grams per snack and about 25 grams of protein per meal  (this is for 3 snacks) -15 grams of carbohydrate per snack and 45 grams of carbohydrates per meal -5 grams of fat for each snack and 9.6 grams of fat per each meal  Teaching Method Utilized:  There was no teaching involved per pt request.  Handouts given during visit include:  Exchange list  The elimination diet  The snack sheet  Barriers to learning/adherence to lifestyle change: a misconception of health and nutrition  Demonstrated degree of understanding via:  Teach Back   Monitoring/Evaluation:  body weight prn.

## 2015-02-20 ENCOUNTER — Encounter: Payer: Self-pay | Admitting: Family Medicine

## 2015-02-20 ENCOUNTER — Ambulatory Visit (INDEPENDENT_AMBULATORY_CARE_PROVIDER_SITE_OTHER): Payer: BLUE CROSS/BLUE SHIELD | Admitting: Family Medicine

## 2015-02-20 VITALS — BP 116/80 | HR 82 | Temp 97.9°F | Ht 66.75 in | Wt 225.2 lb

## 2015-02-20 DIAGNOSIS — E039 Hypothyroidism, unspecified: Secondary | ICD-10-CM | POA: Diagnosis not present

## 2015-02-20 DIAGNOSIS — Z0000 Encounter for general adult medical examination without abnormal findings: Secondary | ICD-10-CM

## 2015-02-20 DIAGNOSIS — E785 Hyperlipidemia, unspecified: Secondary | ICD-10-CM

## 2015-02-20 DIAGNOSIS — E669 Obesity, unspecified: Secondary | ICD-10-CM

## 2015-02-20 LAB — CHOLESTEROL, TOTAL: CHOLESTEROL: 209 mg/dL — AB (ref 0–200)

## 2015-02-20 LAB — HDL CHOLESTEROL: HDL: 59.8 mg/dL (ref >39.00)

## 2015-02-20 LAB — HEMOGLOBIN A1C: Hgb A1c MFr Bld: 5.9 % (ref 4.6–6.5)

## 2015-02-20 NOTE — Patient Instructions (Signed)
BEFORE YOU LEAVE: -labs -follow up in 1 year for annual physical  You last bone density test was about 2 years ago and was in the normal range. Please let us know if you wish to repeat this.  -We have ordered labs or studies at this visit. It can take up to 1-2 weeks for results and processing. We will contact you with instructions IF your results are abnormal. Normal results will be released to your [facility]. If you have not heard from [place] or can not find your results in [facility] in 2 weeks please contact our office.   We recommend the following healthy lifestyle measures: - eat a healthy whole foods diet consisting of regular small meals composed of vegetables, fruits, beans, nuts, seeds, healthy meats such as white chicken and fish and whole grains.  - avoid sweets, white starchy foods, fried foods, fast food, processed foods, sodas, red meet and other fattening foods.  - get a least 150-300 minutes of aerobic exercise per week.

## 2015-02-20 NOTE — Progress Notes (Signed)
HPI:  Here for CPE:  -Concerns and/or follow up today: none  -Diet: variety of foods, balance and well rounded, larger portion sizes  -Exercise: no regular exercise  -Taking folic acid, vitamin D or calcium: Vit D  -Diabetes and Dyslipidemia Screening: FASTINg  -Hx of HTN: no  -Vaccines: UTD  -pap history: sees Dr.  Dionne Bucy  -sexual activity: yes, female partner, no new partners  -wants STI testing (Hep C if born [birth year]): wants hep c  -FH breast, colon or ovarian ca: see FH Last mammogram: sees gyn and breast clinic for hx breast ca Last colon cancer screening: done  Bone density done 2 years ago and low normal.  -Alcohol, Tobacco, drug use: see social history  Review of Systems - no fevers, unintentional weight loss, vision loss, hearing loss, chest pain, sob, hemoptysis, melena, hematochezia, hematuria, genital discharge, changing or concerning skin lesions, bleeding, bruising, loc, thoughts of self harm or SI  Past Medical History  Diagnosis Date  . Nephrolithiasis     hx of  . IBS (irritable bowel syndrome)   . Hyperlipidemia   . Breast cancer ([place])     hx of with rediation, surgery and adjuvant theapy with tamoxifen  (year two)  . Hypothyroidism 09-27-11    Low function  . Sleep apnea 09-27-11    Cpap used ,started 2'2011  . Arthritis 09-27-11    Right knee/Cortisone injection 2'13  . Acute medial meniscal tear 10/02/2011  . OSTEOARTHROS UNSPEC WHETHER GEN/LOC [facility] SITE 02/28/2009    Qualifier: Diagnosis of  By: Arnoldo Morale MD, [place], HX OF 11/26/2006    Qualifier: Diagnosis of  By: Jimmye Norman LPN, [place] Cunas     Past Surgical History  Procedure Laterality Date  . Tonsillectomy    . Myomectomy  1990  . Kidney stones  1977  . Cataract extraction  09-27-11    Bilateral  . Breast biopsy  1977    right  . Breast lumpectomy  2006    raditon 33 tx-right  . Knee arthroscopy  10/02/2011    Procedure: ARTHROSCOPY KNEE;  Surgeon: Gearlean Alf, MD;   Location: [facility];  Service: Orthopedics;  Laterality: Right;  with Debridement, right knee medial meniscus    Family History  Problem Relation Age of Onset  . Hypertension Mother   . Diabetes Mother   . Cancer Mother     uterine  . Colon cancer Neg Hx     Social History   Social History  . Marital Status: Married    Spouse Name: N/A  . Number of Children: N/A  . Years of Education: N/A   Social History Main Topics  . Smoking status: Never Smoker   . Smokeless tobacco: Never Used  . Alcohol Use: 0.0 oz/week    0 Standard drinks or equivalent per week     Comment: rare occ  . Drug Use: No  . Sexual Activity: Yes   Other Topics Concern  . 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  Cholecalciferol (VITAMIN D-3 PO), Take 5,000 Units by mouth daily., Disp: , Rfl:  .  Cyanocobalamin (VITAMIN B-12 PO), Take by mouth  daily., Disp: , Rfl:  .  metroNIDAZOLE (METROGEL) 0.75 % gel, , Disp: , Rfl: 98 .  NATURE-THROID 65 MG tablet, Take 1 tablet (65 mg total) by mouth daily., Disp: 90 tablet, Rfl: 1  EXAM:  Filed Vitals:   02/20/15 0809  BP: 116/80  Pulse: 82  Temp: 97.9 F (36.6 C)    GENERAL: vitals reviewed and listed below, alert, oriented, appears well hydrated and in no acute distress  HEENT: head atraumatic, PERRLA, normal appearance of eyes, ears, nose and mouth. moist mucus membranes.  NECK: supple, no masses or lymphadenopathy  LUNGS: clear to auscultation bilaterally, no rales, rhonchi or wheeze  CV: HRRR, no peripheral edema or cyanosis, normal pedal pulses  BREAST: declined  ABDOMEN: bowel sounds normal, soft, non tender to palpation, no masses, no rebound or guarding  GU: declined  SKIN: no rash or abnormal lesions  MS:  normal gait, moves all extremities normally  NEURO: CN II-XII grossly intact, normal muscle strength and sensation to light touch on extremities  PSYCH: normal affect, pleasant and cooperative  ASSESSMENT AND PLAN:  Discussed the following assessment and plan:  Visit for preventive health examination - Plan: Hemoglobin A1c  Hypothyroidism, unspecified hypothyroidism type  Hyperlipemia - Plan: Lipid Panel  Obesity - Plan: Hep C Antibody   -Discussed and advised all [place] preventive services health task force level A and B recommendations for age, sex and risks.  -Advised at least 150 minutes of exercise per week and a healthy diet low in saturated fats and sweets and consisting of fresh fruits and vegetables, lean meats such as fish and white chicken and whole grains.  -labs, studies and vaccines per orders this encounter  Orders Placed This Encounter  Procedures  . Lipid Panel  . Hemoglobin A1c  . Hep C Antibody    Patient advised to return to clinic immediately if symptoms worsen or persist or new concerns.  Patient Instructions  BEFORE YOU LEAVE: -labs -follow up in 1 year for annual physical  You last bone density test was about 2 years ago and was in the normal range. Please let us know if you wish to repeat this.  -We have ordered labs or studies at this visit. It can take up to 1-2 weeks for results and processing. We will contact you with instructions IF your results are abnormal. Normal results will be released to your [facility]. If you have not heard from [place] or can not find your results in [facility] in 2 weeks please contact our office.   We recommend the following healthy lifestyle measures: - eat a healthy whole foods diet consisting of regular small meals composed of vegetables, fruits, beans, nuts, seeds, healthy meats such as white chicken and fish and whole grains.  - avoid sweets, white starchy foods, fried foods, fast food, processed foods, sodas, red meet and  other fattening foods.  - get a least 150-300 minutes of aerobic exercise per week.      No Follow-up on file.  Colin Benton R.

## 2015-02-20 NOTE — Progress Notes (Signed)
Pre visit review using our clinic review tool, if applicable. No additional management support is needed unless otherwise documented below in the visit note. 

## 2015-02-20 NOTE — Addendum Note (Signed)
Addended by: Elmer Picker on: 02/20/2015 09:28 AM   Modules accepted: Orders

## 2015-02-21 LAB — HEPATITIS C ANTIBODY: HCV AB: NEGATIVE

## 2015-03-09 ENCOUNTER — Encounter: Payer: Self-pay | Admitting: Internal Medicine

## 2015-03-09 ENCOUNTER — Ambulatory Visit (INDEPENDENT_AMBULATORY_CARE_PROVIDER_SITE_OTHER): Payer: BLUE CROSS/BLUE SHIELD | Admitting: Internal Medicine

## 2015-03-09 VITALS — BP 118/80 | HR 77 | Temp 98.2°F | Resp 12 | Wt 226.6 lb

## 2015-03-09 DIAGNOSIS — E538 Deficiency of other specified B group vitamins: Secondary | ICD-10-CM | POA: Diagnosis not present

## 2015-03-09 DIAGNOSIS — E039 Hypothyroidism, unspecified: Secondary | ICD-10-CM

## 2015-03-09 LAB — VITAMIN B12: VITAMIN B 12: 337 pg/mL (ref 211–911)

## 2015-03-09 LAB — T4, FREE: FREE T4: 0.67 ng/dL (ref 0.60–1.60)

## 2015-03-09 LAB — TSH: TSH: 1.87 u[IU]/mL (ref 0.35–4.50)

## 2015-03-09 LAB — T3, FREE: T3 FREE: 3.3 pg/mL (ref 2.3–4.2)

## 2015-03-09 NOTE — Patient Instructions (Signed)
Please stop at the lab.  Please come back for a follow-up appointment in 6 months.  Please ask Dr Maudie Mercury about B12 injections.

## 2015-03-09 NOTE — Progress Notes (Signed)
Patient ID: Jacqueline Jennings, female   DOB: Dec 17, 1950, 64 y.o.   MRN: [id number]   HPI  Jacqueline Jennings is a 64 y.o.-year-old female, returning for follow-up for uncontrolled hypothyroidism. Last visit 6 months ago.   Reviewed history: Pt. has been dx with hypothyroidism in 2007-2008; started on Synthroid >> not feeling better >> was on Armour 60 mg >> now nature thyroid 65 mg, taken - fasting - with water - separated by >30 min from b'fast  - no calcium - no iron, PPIs, multivitamins   Patient is not on iodine supplements - was using Kelp, not recently.  Her TSH was overly suppressed in thepast, as shown below. She was initially on a high dose of Armour: 120 mg, which we subsequently decreased to 90 mg, and then further to 60 mg >> NatureThroid 65. Labs normalized on this dose.  Revieweing her TFTs, she has been thyrotoxic 2012-2015: Lab Results  Component Value Date   TSH 2.39 09/08/2014   TSH 1.67 05/24/2014   TSH 1.62 03/01/2014   TSH 0.03* 01/12/2014   TSH 0.05* 11/23/2013   TSH 0.05* 02/01/2013   TSH 0.10* 05/26/2012   TSH 0.04* 02/20/2012   TSH 0.04* 09/03/2011   TSH 0.10* 04/18/2011   FREET4 0.69 09/08/2014   FREET4 0.79 05/24/2014   FREET4 0.73 03/01/2014   FREET4 0.77 01/12/2014   FREET4 1.02 11/23/2013   FREET4 0.89 05/26/2012   FREET4 0.92 02/20/2012   FREET4 0.87 04/18/2011   FREET4 0.76 01/14/2011   FREET4 0.72 10/12/2009    Pt describes: - + fatigue - no weight gain/loss - no tremors - no palpitations - + hot flushes - no constipation - no dry skin - + hair loss - no diarrhea/constipation  Pt denies feeling nodules in neck, hoarseness, dysphagia/odynophagia, SOB with lying down.  She also has a history of BrCa 2006 >> RxTx to R Breast.    ROS: Constitutional: see HPI Eyes: no blurry vision, no xerophthalmia ENT: no sore throat, no nodules palpated in throat, no dysphagia/odynophagia, no hoarseness Cardiovascular: no  CP/SOB/palpitations/leg swelling Respiratory: no cough/SOB Gastrointestinal: no N/V/D/C Musculoskeletal: no muscle/joint aches Skin: no rashes, no easy bruising, no hair loss Neurological: no tremors/numbness/tingling/dizziness  I reviewed pt's medications, allergies, PMH, social hx, family hx, and changes were documented in the history of present illness. Otherwise, unchanged from my initial visit note.  PE: BP 118/80 mmHg  Pulse 77  Temp(Src) 98.2 F (36.8 C) (Oral)  Resp 12  Wt 226 lb 9.6 oz (102.785 kg)  SpO2 95% Body mass index is 35.78 kg/(m^2). Wt Readings from Last 3 Encounters:  03/09/15 226 lb 9.6 oz (102.785 kg)  02/20/15 225 lb 3.2 oz (102.15 kg)  02/03/15 221 lb (100.245 kg)   Constitutional: obese, in NAD Eyes: PERRLA, EOMI, no exophthalmos ENT: moist mucous membranes, no thyromegaly, no cervical lymphadenopathy Cardiovascular: RRR, No MRG Respiratory: CTA B Gastrointestinal: abdomen soft, NT, ND, BS+ Musculoskeletal: no deformities, strength intact in all 4 Skin: palmar hyperhydrosis, warm, no rashes Neurological: no tremor with outstretched hands, DTR normal in all 4  ASSESSMENT: 1. Hypothyroidism - on desiccated thyroid extract  2. B12 def  PLAN:  1. Patient with long-standing hypothyroidism, on NatureThroid 65 mg daily. She appears euthyroid, but still complains of fatigue - found to have B12 deficiency - on replacement. - we reviewed together all her thyroid labs >> normal 6 mo ago - will check thyroid tests today: TSH, free T4, free t3 and we'll see further  adjustments are needed - she will continue to continued to stay off kelp - she knows how to take her thyroid hormones correctly, fasting, with water, separated by at least 30 minutes from breakfast, and separated by more than 4 hours from calcium, iron, multivitamins, acid reflux medications (PPIs). She is taking it correctly  Needs 3 mo supply of NT.  2. B12 def - Low level (210) 5 mo ago >>  started po replacement >> B12 only increased to 280. I think she may not be absorbing the B12 efficiently - will recheck today and if level still low >> I suggested im B12, but I advised her to d/w PCP  Component     Latest Ref Rng 03/09/2015  TSH     0.35 - 4.50 uIU/mL 1.87  T3, Free     2.3 - 4.2 pg/mL 3.3  Free T4     0.60 - 1.60 ng/dL 0.67  Vitamin B-12     211 - 911 pg/mL 337   TFTs great! B12 inching up. She may still benefit from 3-6 B12 injections. I will let her decide.

## 2015-03-10 ENCOUNTER — Ambulatory Visit: Payer: BLUE CROSS/BLUE SHIELD | Admitting: Internal Medicine

## 2015-03-10 MED ORDER — NATURE-THROID 65 MG PO TABS: 65.0000 mg | ORAL_TABLET | Freq: Every day | ORAL | Status: DC

## 2015-03-13 ENCOUNTER — Encounter: Payer: Self-pay | Admitting: Family Medicine

## 2015-03-13 NOTE — Telephone Encounter (Signed)
I called the pt and informed Jacqueline Jennings of the message below and she wanted to let Dr Maudie Mercury know she is already taking the sublingual B12, what should she do?

## 2015-03-13 NOTE — Telephone Encounter (Signed)
I left a detailed message with the information below and per Dr Maudie Mercury she and Dr Cruzita Lederer both agree the numbers are going up and she can again continue the pill or if she wants to boost up her level quicker she can come in once a week for weeks to get the injection as this is up to her.

## 2015-03-20 ENCOUNTER — Encounter: Payer: Self-pay | Admitting: Family Medicine

## 2015-03-20 ENCOUNTER — Telehealth: Payer: Self-pay | Admitting: Gastroenterology

## 2015-03-20 NOTE — Telephone Encounter (Signed)
Left message for patient to call back  

## 2015-03-21 NOTE — Telephone Encounter (Signed)
Patient with gas, bloating, and pain after a meal.  She has tried several food elimination diets and OTC products with no success.  She will come in and see Nicoletta Ba PA on 04/06/15

## 2015-04-06 ENCOUNTER — Other Ambulatory Visit (INDEPENDENT_AMBULATORY_CARE_PROVIDER_SITE_OTHER): Payer: BLUE CROSS/BLUE SHIELD

## 2015-04-06 ENCOUNTER — Ambulatory Visit (INDEPENDENT_AMBULATORY_CARE_PROVIDER_SITE_OTHER): Payer: BLUE CROSS/BLUE SHIELD | Admitting: Physician Assistant

## 2015-04-06 ENCOUNTER — Encounter: Payer: Self-pay | Admitting: Physician Assistant

## 2015-04-06 VITALS — BP 130/80 | HR 76 | Ht 66.0 in | Wt 224.0 lb

## 2015-04-06 DIAGNOSIS — R140 Abdominal distension (gaseous): Secondary | ICD-10-CM | POA: Diagnosis not present

## 2015-04-06 DIAGNOSIS — R1032 Left lower quadrant pain: Secondary | ICD-10-CM

## 2015-04-06 DIAGNOSIS — R1031 Right lower quadrant pain: Secondary | ICD-10-CM

## 2015-04-06 DIAGNOSIS — K589 Irritable bowel syndrome without diarrhea: Secondary | ICD-10-CM

## 2015-04-06 LAB — BASIC METABOLIC PANEL
BUN: 16 mg/dL (ref 6–23)
CALCIUM: 9.6 mg/dL (ref 8.4–10.5)
CO2: 30 meq/L (ref 19–32)
CREATININE: 0.88 mg/dL (ref 0.40–1.20)
Chloride: 108 mEq/L (ref 96–112)
GFR: 68.88 mL/min (ref >60.00)
GLUCOSE: 94 mg/dL (ref 70–99)
Potassium: 5.5 mEq/L — ABNORMAL HIGH (ref 3.5–5.1)
Sodium: 143 mEq/L (ref 135–145)

## 2015-04-06 MED ORDER — SACCHAROMYCES BOULARDII 250 MG PO CAPS: 250.0000 mg | ORAL_CAPSULE | Freq: Two times a day (BID) | ORAL | Status: DC

## 2015-04-06 NOTE — Progress Notes (Signed)
Patient ID: Jacqueline Jennings, female   DOB: 11-24-1950, 64 y.o.   MRN: [id number]   Subjective:    Patient ID: Jacqueline Jennings, female    DOB: [birth year], 64 y.o.   MRN: [id number]  HPI  Jacqueline Jennings   Is a pleasant 64 year old white female known to Dr. Fuller Jennings. She has history of hypothyroidism, IBS, remote history of breast cancer , vitamin D deficiency and was recently diagnosed with B12 deficiency. She had undergone colonoscopy in May 2014 with finding of a 4 mm sessile polyp and moderate diverticulosis.  On the polyp consistent with a tubular adenoma and she was advised to have 5 year interval follow-up. 9 She comes in today stating that she has had many month history of Jacqueline Jennings bloating which she says is severe at times minutes that she has gained some weight in her midsection but feels that her abdominal fullness and heaviness is secondary to bloating. He says she traveled to [place] in 2014 became ill with bronchitis and sinusitis had take several courses of antibiotics etc. And says that she hasn't been right since. She seems to feel that her GI symptoms started after all of that. Her bowel movements are actually normal at this point she's not noted any melena or hematochezia. Appetite has been fine. She has been eating a very healthy diet does not use any artificial sweeteners sodas etc. Stated by persistent bloating. And quite a bit of reading and asks about association with malabsorption given her vitamin D and vitamin B12 deficiency and also asked about bacterial overgrowth  Review of Systems Pertinent positive and negative review of systems were noted in the above HPI section.  All other review of systems was otherwise negative.  Outpatient Encounter Prescriptions as of 04/06/2015  Medication Sig  . Cholecalciferol (VITAMIN D-3 PO) Take 5,000 Units by mouth once a week.   . Cyanocobalamin (VITAMIN B-12 PO) Take 1,000 mg by mouth daily.   . fluocinonide gel (LIDEX) 0.05 %   . loratadine  (CLARITIN) 10 MG tablet Take 10 mg by mouth as needed.   . metroNIDAZOLE (METROGEL) 0.75 % gel   . NATURE-THROID 65 MG tablet Take 1 tablet (65 mg total) by mouth daily.  Marland Kitchen saccharomyces boulardii (FLORASTOR) 250 MG capsule Take 1 capsule (250 mg total) by mouth 2 (two) times daily.   No facility-administered encounter medications on file as of 04/06/2015.   Allergies  Allergen Reactions  . Nabumetone     REACTION: Rash,Hives  . Augmentin [Amoxicillin-Pot Clavulanate] Rash    Drug eruption  . Latex Rash    Adhesives products-causes reddness, rash   Patient Active Problem List   Diagnosis Date Noted  . Obesity 10/01/2012  . Vitamin D deficiency 06/06/2009  . Obstructive sleep apnea 01/11/2009  . Disorder of bone and cartilage 07/17/2007  . Hypothyroidism 07/10/2007  . Hyperlipemia 05/19/2007  . IRRITABLE BOWEL SYNDROME 05/19/2007  . BREAST CANCER, HX OF 05/19/2007   Social History   Social History  . Marital Status: Married    Spouse Name: N/A  . Number of Children: 0  . Years of Education: N/A   Occupational History  . Not on file.   Social History Main Topics  . Smoking status: Never Smoker   . Smokeless tobacco: Never Used  . Alcohol Use: 0.0 oz/week    0 Standard drinks or equivalent per week     Comment: rare occ  . Drug Use: No  . Sexual Activity: Yes   Other Topics Concern  .  Not on file   Social History Narrative   Work or School: retired Education officer, museum, travels a lot      Home Situation: lives with husband      Spiritual Beliefs: Christian      Lifestyle: no regular exercise, healthy diet             Jacqueline Jennings family history includes Diabetes in her father; Hypertension in her mother; Leukemia in her mother; Uterine cancer in her maternal grandmother. There is no history of Colon cancer.      Objective:    Filed Vitals:   04/06/15 1359  BP: 130/80  Pulse: 76    Physical Exam   Developed older white female in no acute distress,  pleasant blood pressure 130/80 pulse 76 height 5 foot 6 weight 224, BMI 6. HEENT; nontraumatic normocephalic EOMI PERRLA sclera anicteric, Cardiovascular; regular rate and rhythm with S1-S2 no murmur or gallop, Pulmonary; clear bilaterally, Abdomen; large soft , mild tenderness bilaterally across the lower abdomen there is no guarding or rebound no palpable mass or hepatosplenomegaly bowel sounds are active, Rectal; exam not done, Ext; no clubbing cyanosis or edema skin warm dry, Neuropsych;      Assessment & Jennings:   #1 64 yo female with several month hx of abdominal bloating ,fullness, heaviness , and lower abdominal discomfort. 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, and certainly may also have small bowel bacterial overgrowth  She is unhappy with her weight - and abdominal weight gain likely also contributing R/o malignancy #2 hx adenomatous colon polyps-follow up due 5/ 2019 #3 hx breast cancer #4 hypothyroid #5 Vit D defciency #6 B12 deficiency  Jennings; Will give course of Xifaxan 550 mg po TID x 14 days Then start daily probiotic -Florastor, Culturelle, or align Schedule for CT abd/pelvis   Low Gas diet, avoid sodas, artificial sweeteners She will speak to a nutritionist, and discussed adding daily exercise Follow up with dr Jacqueline Jennings as needed    Alfredia Ferguson PA-C 04/06/2015   Cc: Lucretia Kern, DO

## 2015-04-06 NOTE — Patient Instructions (Addendum)
Please go to the basement level to have your labs drawn.  Start a probiotic that you can get over the counter.  Suggestions are: Align, Culturelle.  Takes for 4-6 weeks.  We have given you a low gas diet brochure.  We sent a prescription for Xifaxan 550 mg.  Take1 tablet 3 times daily x 14 days.  You will get a call from [facility]. They will run your insurance.  Their number is [phone].  You have been scheduled for a CT scan of the abdomen and pelvis at [facility].  You are scheduled on 04-13-2015 Thursday at 10:00 am. You should arrive at 9:45 am prior to your appointment time for registration. Please follow the written instructions below on the day of your exam:  WARNING: IF YOU ARE ALLERGIC TO IODINE/X-RAY DYE, PLEASE NOTIFY RADIOLOGY IMMEDIATELY AT [phone]! YOU WILL BE GIVEN A 13 HOUR PREMEDICATION PREP.  1) Do not eat or drink anything after 6:00 am  (4 hours prior to your test) 2) You have been given 2 bottles of oral contrast to drink. The solution may taste  better if refrigerated, but do NOT add ice or any other liquid to this solution. Shake well before drinking.    Drink 1 bottle of contrast @ 8:00 am  (2 hours prior to your exam)  Drink 1 bottle of contrast @ 9:00 am  (1 hour prior to your exam)  You may take any medications as prescribed with a small amount of water except for the following: Metformin, Glucophage, Glucovance, Avandamet, Riomet, Fortamet, Actoplus Met, Janumet, Glumetza or Metaglip. The above medications must be held the day of the exam AND 48 hours after the exam.  The purpose of you drinking the oral contrast is to aid in the visualization of your intestinal tract. The contrast solution may cause some diarrhea. Before your exam is started, you will be given a small amount of fluid to drink. Depending on your individual set of symptoms, you may also receive an intravenous injection of x-ray contrast/dye. Plan on being at [facility] for 30 minutes or long, depending on the type of exam you are having performed.  If you have any questions regarding your exam or if you need to reschedule, you may call the CT department at [phone] between the hours of 8:00 am and 5:00 pm, Monday-Friday.  ________________________________________________________________________

## 2015-04-07 ENCOUNTER — Telehealth: Payer: Self-pay | Admitting: *Deleted

## 2015-04-07 NOTE — Telephone Encounter (Signed)
Received a call from [facility] representative.  He advised that the patient will be receiving her Xifaxan 550 on Wed 04-12-2015.

## 2015-04-09 NOTE — Progress Notes (Signed)
Reviewed and agree with management plan.  Seanmichael Salmons T. Bertie Simien, MD FACG 

## 2015-04-13 ENCOUNTER — Ambulatory Visit (HOSPITAL_COMMUNITY)
Admission: RE | Admit: 2015-04-13 | Discharge: 2015-04-13 | Disposition: A | Discharge status: Home / Self Care | Payer: BLUE CROSS/BLUE SHIELD | Source: Ambulatory Visit | Attending: Physician Assistant | Admitting: Physician Assistant

## 2015-04-13 DIAGNOSIS — K589 Irritable bowel syndrome without diarrhea: Secondary | ICD-10-CM | POA: Insufficient documentation

## 2015-04-13 DIAGNOSIS — R1032 Left lower quadrant pain: Secondary | ICD-10-CM | POA: Insufficient documentation

## 2015-04-13 DIAGNOSIS — R140 Abdominal distension (gaseous): Secondary | ICD-10-CM | POA: Insufficient documentation

## 2015-04-13 DIAGNOSIS — D259 Leiomyoma of uterus, unspecified: Secondary | ICD-10-CM | POA: Insufficient documentation

## 2015-04-13 DIAGNOSIS — R1031 Right lower quadrant pain: Secondary | ICD-10-CM | POA: Diagnosis present

## 2015-04-13 DIAGNOSIS — K760 Fatty (change of) liver, not elsewhere classified: Secondary | ICD-10-CM | POA: Diagnosis not present

## 2015-04-13 DIAGNOSIS — K5730 Diverticulosis of large intestine without perforation or abscess without bleeding: Secondary | ICD-10-CM | POA: Insufficient documentation

## 2015-04-13 MED ORDER — IOHEXOL 300 MG/ML  SOLN
100.0000 mL | Freq: Once | INTRAMUSCULAR | Status: AC | PRN
  Administered 2015-04-13: 100 mL via INTRAVENOUS

## 2015-04-19 ENCOUNTER — Telehealth: Payer: Self-pay

## 2015-04-19 NOTE — Telephone Encounter (Signed)
I have left message for the patient to call back 

## 2015-04-19 NOTE — Telephone Encounter (Signed)
-----   Message from Alfredia Ferguson, PA-C sent at 04/18/2015 12:00 PM EST -----  CT was negative except for the fibroids.. I gave her the Xifaxan to treat IBS /bacterial overgrowth which is likely what's causing her bloating/etc If she does not want to take it ,thats ok.. She should see her Gyn about fibroids ----- Message -----    From: Greggory Keen, LPN    Sent: [id number]   2:54 PM      To: Amy Genia Harold, PA-C  Review your last note. She wants to be certain if she should still take Xifaxan since the CT scan.  She insisted I ask you.

## 2015-04-19 NOTE — Telephone Encounter (Signed)
Discussed with the patient. Also discussed briefly fatty liver issue.

## 2015-07-13 ENCOUNTER — Telehealth: Payer: Self-pay | Admitting: Gastroenterology

## 2015-07-14 NOTE — Telephone Encounter (Signed)
Patient with constipation that she feels is associated with Align.  She has changed her diet, lost 18 lbs, but now has new constipation.  She is advised to add Miralax, more fluid to her diet, or consider a stool softener.  She is advised that if she feels the constipation is related to Align, she should stop it.  She will call back for any additional questions or concerns.

## 2015-09-26 ENCOUNTER — Ambulatory Visit (INDEPENDENT_AMBULATORY_CARE_PROVIDER_SITE_OTHER): Payer: BLUE CROSS/BLUE SHIELD | Admitting: Pulmonary Disease

## 2015-09-26 ENCOUNTER — Encounter: Payer: Self-pay | Admitting: Pulmonary Disease

## 2015-09-26 VITALS — BP 132/78 | HR 59 | Ht 66.0 in | Wt 187.0 lb

## 2015-09-26 DIAGNOSIS — G4733 Obstructive sleep apnea (adult) (pediatric): Secondary | ICD-10-CM

## 2015-09-26 DIAGNOSIS — E669 Obesity, unspecified: Secondary | ICD-10-CM | POA: Diagnosis not present

## 2015-09-26 NOTE — Progress Notes (Signed)
Subjective:    Patient ID: Jacqueline Jennings, female    DOB: [birth year], 65 y.o.   MRN: [id number]  HPI Pt is being seen for OSA. Has mild OSA and uses cpap.   ROV (09/26/15) pt returns to office as f/u on her osa.  Since last seen, she states she  Has been doing well. Uses cpap machine. Feels better using it. More energy. Less sleepiness.  DL for May 2017  696%, AHI 0.1. Occasional mouth dryness with cpap. Has nasal pillows.  Review of Systems  Constitutional: Negative.   HENT: Negative.   Eyes: Negative.   Respiratory: Positive for shortness of breath. Negative for cough.   Cardiovascular: Negative.   Gastrointestinal: Negative.   Endocrine: Negative.   Genitourinary: Negative.   Musculoskeletal: Negative.   Skin: Negative.   Allergic/Immunologic: Negative.   Neurological: Negative.   Hematological: Negative.   Psychiatric/Behavioral: Negative.   All other systems reviewed and are negative.  Past Medical History  Diagnosis Date  . Nephrolithiasis     hx of  . IBS (irritable bowel syndrome)   . Hyperlipidemia   . Breast cancer (HCC)     hx of with rediation, surgery and adjuvant theapy with tamoxifen  (year two)  . Hypothyroidism 09-27-11    Low function  . Sleep apnea 09-27-11    Cpap used ,started 2'2011  . Arthritis 09-27-11    Right knee/Cortisone injection 2'13  . Acute medial meniscal tear 10/02/2011  . OSTEOARTHROS UNSPEC WHETHER GEN/LOC [facility] SITE 02/28/2009    Qualifier: Diagnosis of  By: Lovell Sheehan MD, Balinda Quails NEPHROLITHIASIS, HX OF 11/26/2006    Qualifier: Diagnosis of  By: Mayford Knife, LPN, Domenic Polite   . Colon polyps 2014     Family History  Problem Relation Age of Onset  . Hypertension Mother   . Leukemia Mother   . Colon cancer Neg Hx   . Diabetes Father   . Uterine cancer Maternal Grandmother      Past Surgical History  Procedure Laterality Date  . Tonsillectomy    . Myomectomy  1990  . Cystoscopy  1977  . Cataract extraction Bilateral 2008  . Breast  biopsy Right 1977  . Breast lumpectomy Right 2006    raditon 33 tx-right  . Knee arthroscopy  10/02/2011    Procedure: ARTHROSCOPY KNEE;  Surgeon: Loanne Drilling, MD;  Location: [facility];  Service: Orthopedics;  Laterality: Right;  with Debridement, right knee medial meniscus    Social History   Social History  . Marital Status: Married    Spouse Name: N/A  . Number of Children: 0  . Years of Education: N/A   Occupational History  . Not on file.   Social History Main Topics  . Smoking status: Never Smoker   . Smokeless tobacco: Never Used  . Alcohol Use: 0.0 oz/week    0 Standard drinks or equivalent per week     Comment: rare occ  . Drug Use: No  . Sexual Activity: Yes   Other Topics Concern  . 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 Nabumetone     REACTION: Rash,Hives  .  Augmentin [Amoxicillin-Pot Clavulanate] Rash    Drug eruption  . Latex Rash    Adhesives products-causes reddness, rash     Outpatient Prescriptions Prior to Visit  Medication Sig Dispense Refill  . Cholecalciferol (VITAMIN D-3 PO) Take 5,000 Units by mouth once a week.     . Cyanocobalamin (VITAMIN B-12 PO) Take 1,000 mg by mouth daily.     Marland Kitchen loratadine (CLARITIN) 10 MG tablet Take 10 mg by mouth as needed.     . metroNIDAZOLE (METROGEL) 0.75 % gel   98  . NATURE-THROID 65 MG tablet Take 1 tablet (65 mg total) by mouth daily. 90 tablet 3  . fluocinonide gel (LIDEX) 0.05 %   2  . saccharomyces boulardii (FLORASTOR) 250 MG capsule Take 1 capsule (250 mg total) by mouth 2 (two) times daily. 60 capsule 1   No facility-administered medications prior to visit.   No orders of the defined types were placed in this encounter.          Objective:   Physical  Exam  Vitals:  Filed Vitals:   09/26/15 1343  BP: 132/78  Pulse: 59  Height: 5\' 6"  (1.676 m)  Weight: 187 lb (84.823 kg)  SpO2: 94%    Filed Vitals:   09/26/15 1343  BP: 132/78  Pulse: 59   Filed Vitals:   09/26/15 1343  BP: 132/78  Pulse: 59  Height: 5\' 6"  (1.676 m)  Weight: 187 lb (84.823 kg)  SpO2: 94%      Constitutional/General:  Pleasant, well-nourished, well-developed, not in any distress,  Comfortably seating.  Well kempt  Body mass index is 30.2 kg/(m^2). Wt Readings from Last 3 Encounters:  09/26/15 187 lb (84.823 kg)  04/06/15 224 lb (101.606 kg)  03/09/15 226 lb 9.6 oz (102.785 kg)      HEENT: Pupils equal and reactive to light and accommodation. Anicteric sclerae. Normal nasal mucosa.   No oral  lesions,  mouth clear,  oropharynx clear, no postnasal drip. (-) Oral thrush. No dental caries.  Airway - Mallampati class III-IV  Neck: No masses. Midline trachea. No JVD, (-) LAD. (-) bruits appreciated.  Respiratory/Chest: Grossly normal chest. (-) deformity. (-) Accessory muscle use.  Symmetric expansion. (-) Tenderness on palpation.  Resonant on percussion.  Diminished BS on both lower lung zones. (-) wheezing, crackles, rhonchi (-) egophony  Cardiovascular: Regular rate and  rhythm, heart sounds normal, no murmur or gallops, no peripheral edema  Gastrointestinal:  Normal bowel sounds. Soft, non-tender. No hepatosplenomegaly.  (-) masses.   Musculoskeletal:  Normal muscle tone. Normal gait.   Extremities: Grossly normal. (-) clubbing, cyanosis.  (-) edema  Skin: (-) rash,lesions seen.   Neurological/Psychiatric : alert, oriented to time, place, person. Normal mood and affect            Assessment & Plan:  Obstructive sleep apnea NPSG 2010:  AHI 11/hr with desat to 80% On cpap auto 4-20cm. Got new machine in 10/2015  Pt returns to clinic as f/u on her osa. Feels better using cpap.  DL last month 08/2015  AHI 0.1, 100%  Cont  cpap 5-20 cm water. Cont nasal pillows. Occasional mouth dryness. Will order chin strap.   Obesity Weight reduction.     Return to clinic in 1 yr.   Monica Becton, MD 09/26/2015, 2:10 PM [facility] Pager [phone] After 3 pm or if no answer, call [phone]

## 2015-09-26 NOTE — Assessment & Plan Note (Signed)
Weight reduction 

## 2015-09-26 NOTE — Patient Instructions (Signed)
We extensively discussed the importance of treating OSA and the need to use PAP therapy.   Continue with autocpap 5-20 cm H2O.    Patient was instructed to have mask, tubings, filter, reservoir cleaned at least once a week with soapy water.  Patient was instructed to call the office if he/she is having issues with the PAP device.    I advised patient to obtain sufficient amount of sleep --  7 to 8 hours at least in a 24 hr period.  Patient was advised to follow good sleep hygiene.  Patient was advised NOT to engage in activities requiring concentration and/or vigilance if he/she is and  sleepy.  Patient is NOT to drive if he/she is sleepy.   Return to clinic in 1 year.

## 2015-09-26 NOTE — Assessment & Plan Note (Addendum)
NPSG 2010:  AHI 11/hr with desat to 80% On cpap auto 4-20cm. Got new machine in 10/2015  Pt returns to clinic as f/u on her osa. Feels better using cpap.  DL last month 08/2015  AHI 0.1, 100%  Cont cpap 5-20 cm water. Cont nasal pillows. Occasional mouth dryness. Will order chin strap.

## 2015-10-16 ENCOUNTER — Encounter: Payer: Self-pay | Admitting: Pulmonary Disease

## 2015-11-23 ENCOUNTER — Encounter: Payer: Self-pay | Admitting: Family Medicine

## 2015-12-19 ENCOUNTER — Ambulatory Visit (INDEPENDENT_AMBULATORY_CARE_PROVIDER_SITE_OTHER): Payer: BLUE CROSS/BLUE SHIELD | Admitting: Family Medicine

## 2015-12-19 ENCOUNTER — Encounter: Payer: Self-pay | Admitting: Family Medicine

## 2015-12-19 VITALS — BP 100/70 | HR 76 | Temp 97.7°F | Ht 66.0 in | Wt 189.4 lb

## 2015-12-19 DIAGNOSIS — M25571 Pain in right ankle and joints of right foot: Secondary | ICD-10-CM | POA: Diagnosis not present

## 2015-12-19 DIAGNOSIS — Z23 Encounter for immunization: Secondary | ICD-10-CM

## 2015-12-19 NOTE — Patient Instructions (Addendum)
BEFORE YOU LEAVE: -follow up: 2 weeks -post tib exercises -flu shot  Do the exercises 4 days per week.  Aleve 1-2 times daily as needed for pain.  Ice twice daily.  Elevate leg 30 minutes daily.  Compression during the day.  Good supportive shoes.  Follow up sooner if worsening or new concerns.

## 2015-12-19 NOTE — Progress Notes (Signed)
Pre visit review using our clinic review tool, if applicable. No additional management support is needed unless otherwise documented below in the visit note. 

## 2015-12-19 NOTE — Progress Notes (Addendum)
HPI:  Acute visit for R medical ankle pain: -for several days, hx of same several months ago (felt stinging pain at that time) -feels like has more veins on this ankle then the other -pain with activities and trying on shoes -denies: fevers, malaise, known injury, sig leg swelling, redness, numbness, weakness  ROS: See pertinent positives and negatives per HPI.  Past Medical History:  Diagnosis Date  . Acute medial meniscal tear 10/02/2011  . Arthritis 09-27-11   Right knee/Cortisone injection 2'13  . Breast cancer ([place])    hx of with rediation, surgery and adjuvant theapy with tamoxifen  (year two)  . Colon polyps 2014  . Hyperlipidemia   . Hypothyroidism 09-27-11   Low function  . IBS (irritable bowel syndrome)   . Nephrolithiasis    hx of  . NEPHROLITHIASIS, HX OF 11/26/2006   Qualifier: Diagnosis of  By: Jimmye Norman, LPN, [place] WHETHER GEN/LOC [facility] SITE 02/28/2009   Qualifier: Diagnosis of  By: Arnoldo Morale MD, Balinda Quails Sleep apnea 09-27-11   Cpap used ,started [id number]    Past Surgical History:  Procedure Laterality Date  . BREAST BIOPSY Right 1977  . BREAST LUMPECTOMY Right 2006   raditon 33 tx-right  . CATARACT EXTRACTION Bilateral 2008  . CYSTOSCOPY  1977  . KNEE ARTHROSCOPY  10/02/2011   Procedure: ARTHROSCOPY KNEE;  Surgeon: Gearlean Alf, MD;  Location: [facility];  Service: Orthopedics;  Laterality: Right;  with Debridement, right knee medial meniscus  . MYOMECTOMY  1990  . TONSILLECTOMY      Family History  Problem Relation Age of Onset  . Hypertension Mother   . Leukemia Mother   . Colon cancer Neg Hx   . Diabetes Father   . Uterine cancer Maternal Grandmother     Social History   Social History  . Marital status: Married    Spouse name: N/A  . Number of children: 0  . Years of education: N/A   Social History Main Topics  . Smoking status: Never Smoker  . Smokeless tobacco: Never Used  . Alcohol use 0.0 oz/week     Comment:  rare occ  . Drug use: No  . Sexual activity: Yes   Other Topics Concern  . 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  Cholecalciferol (VITAMIN D-3 PO), Take 5,000 Units by mouth once a week. , Disp: , Rfl:  .  Cyanocobalamin (VITAMIN B-12 PO), Take 1,000 mg by mouth daily. , Disp: , Rfl:  .  loratadine (CLARITIN) 10 MG tablet, Take 10 mg by mouth as needed. , Disp: , Rfl:  .  metroNIDAZOLE (METROGEL) 0.75 % gel, , Disp: , Rfl: 98 .  NATURE-THROID 65 MG tablet, Take 1 tablet (65 mg total) by mouth daily., Disp: 90 tablet, Rfl: 3  EXAM:  Vitals:   12/19/15 1629  BP: 100/70  Pulse: 76  Temp: 97.7 F (36.5 C)    Body mass index is 30.57 kg/m.  GENERAL: vitals reviewed and listed above, alert, oriented, appears well hydrated and in no acute distress  HEENT: atraumatic, conjunttiva clear, no obvious abnormalities on inspection of external  nose and ears  NECK: no obvious masses on inspection  MS: moves all extremities without noticeable abnormality, TTP post tib tendon with mild edema R compared to left in the medial ankle only, some spider veins without tenderness over superficial or deep veins, redness or difuse vein swelling. Normal ROM and strength in ankle/foot. NV intact distal.  PSYCH: pleasant and cooperative, no obvious depression or anxiety  ASSESSMENT AND PLAN:  Discussed the following assessment and plan: More than 50% of over 25  minutes spent in total in caring for this patient was spent face-to-face with the patient, counseling and/or coordinating care.   Ankle pain, right  -we discussed possible serious and likely etiologies, workup and treatment, treatment risks and return precautions -suspect tendon pathology given tenderness on  exam in tendon -offered [place] to exclude venous pathology as husband seems worried about this, but she declined for now - certainty has no findings  Today to suggest DVT. Husband also wanted her to see Dr. Tamala Julian in sports med which she also declined. -aleve, elevation, ice, HEP and good footwear advised -follow up 2 weeks or sooner as needed -Patient advised to return or notify a doctor immediately if symptoms worsen or persist or new concerns arise.  Patient Instructions  BEFORE YOU LEAVE: -follow up: 2 weeks -post tib exercises -flu shot  Do the exercises 4 days per week.  Aleve 1-2 times daily as needed for pain.  Ice twice daily.  Elevate leg 30 minutes daily.  Compression during the day.  Good supportive shoes.  Follow up sooner if worsening or new concerns.    Colin Benton R., DO

## 2016-01-04 ENCOUNTER — Ambulatory Visit: Payer: BLUE CROSS/BLUE SHIELD | Admitting: Family Medicine

## 2016-02-21 NOTE — Progress Notes (Signed)
HPI:  Here for CPE:  -Concerns and/or follow up today:  PMH obesity, OSA - sees pulmonologist, hypothyroidsism -  sees endocrinologist, IBS - sees GI remote hx breast Ca - sees gynecologist, here for preventive visit. Also hx low b12, taking sublingual B12 and wants to recheck. She found that eliminating eggs resolved her bowel issues.  -Diet: variety of foods, balance and well rounded - has had good results with weight reduction with weight watchers.  -Exercise: regular exercise  -Taking folic acid, vitamin D or calcium: yes  -Diabetes and Dyslipidemia Screening: FASTING for labs  -Hx of HTN: no  -Vaccines: UTD except tetanus booster - wants to do this  -pap history: Sees gyn, Dr. Candie Mile  -FDLMP: n/a  -sexual activity: yes, female partner, no new partners  -wants STI testing (Hep C if born [birth year]): no  -FH breast, colon or ovarian ca: see FH Last mammogram: sees [facility] Last colon cancer screening: done 2014 - 5 year repeat advised  Normal bone density 2014 - [place]  -Alcohol, Tobacco, drug use: see social history  Review of Systems - no fevers, unintentional weight loss, vision loss, hearing loss, chest pain, sob, hemoptysis, melena, hematochezia, hematuria, genital discharge, changing or concerning skin lesions, bleeding, bruising, loc, thoughts of self harm or SI  Past Medical History:  Diagnosis Date  . Acute medial meniscal tear 10/02/2011  . Arthritis 09-27-11   Right knee/Cortisone injection 2'13  . Breast cancer ([place])    hx of with rediation, surgery and adjuvant theapy with tamoxifen  (year two)  . Colon polyps 2014  . Hyperlipidemia   . Hypothyroidism 09-27-11   Low function  . IBS (irritable bowel syndrome)   . Nephrolithiasis    hx of  . NEPHROLITHIASIS, HX OF 11/26/2006   Qualifier: Diagnosis of  By: Jimmye Norman, LPN, [place] WHETHER GEN/LOC [facility] SITE 02/28/2009   Qualifier: Diagnosis of  By:  Arnoldo Morale MD, Balinda Quails Sleep apnea 09-27-11   Cpap used ,started [id number]    Past Surgical History:  Procedure Laterality Date  . BREAST BIOPSY Right 1977  . BREAST LUMPECTOMY Right 2006   raditon 33 tx-right  . CATARACT EXTRACTION Bilateral 2008  . CYSTOSCOPY  1977  . KNEE ARTHROSCOPY  10/02/2011   Procedure: ARTHROSCOPY KNEE;  Surgeon: Gearlean Alf, MD;  Location: [facility];  Service: Orthopedics;  Laterality: Right;  with Debridement, right knee medial meniscus  . MYOMECTOMY  1990  . TONSILLECTOMY      Family History  Problem Relation Age of Onset  . Hypertension Mother   . Leukemia Mother   . Colon cancer Neg Hx   . Diabetes Father   . Uterine cancer Maternal Grandmother     Social History   Social History  . Marital status: Married    Spouse name: N/A  . Number of children: 0  . Years of education: N/A   Social History Main Topics  . Smoking status: Never Smoker  . Smokeless tobacco: Never Used  . Alcohol use 0.0 oz/week     Comment: rare occ  . Drug use: No  . Sexual activity: Yes   Other Topics Concern  . 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  Cholecalciferol (VITAMIN D-3 PO), Take 1,000 Units by mouth daily. , Disp: , Rfl:  .  Cyanocobalamin (VITAMIN B-12 PO), Take 1,000 mg by mouth daily. , Disp: , Rfl:  .  loratadine (CLARITIN) 10 MG tablet, Take 10 mg by mouth as needed. , Disp: , Rfl:  .  metroNIDAZOLE (METROGEL) 0.75 % gel, , Disp: , Rfl: 98 .  NATURE-THROID 65 MG tablet, Take 1 tablet (65 mg total) by mouth daily., Disp: 90 tablet, Rfl: 3  EXAM:  Vitals:   02/22/16 0802  BP: 102/68  Pulse: 74  Temp: 97.8 F (36.6 C)    GENERAL: vitals reviewed and listed below, alert, oriented, appears well hydrated and in no  acute distress  HEENT: head atraumatic, PERRLA, normal appearance of eyes, ears, nose and mouth. moist mucus membranes.  NECK: supple, no masses or lymphadenopathy  LUNGS: clear to auscultation bilaterally, no rales, rhonchi or wheeze  CV: HRRR, no peripheral edema or cyanosis, normal pedal pulses  BREAST: declined, sees gyn  ABDOMEN: bowel sounds normal, soft, non tender to palpation, no masses, no rebound or guarding  GU: declined - sees gyn  SKIN: no rash or abnormal lesions - declined full skin exam, does with Dr. Magda Kiel  MS: normal gait, moves all extremities normally  NEURO: normal gait, speech and thought processing grossly intact, muscle tone grossly intact throughout  PSYCH: normal affect, pleasant and cooperative  ASSESSMENT AND PLAN:  Discussed the following assessment and plan:  Encounter for preventive health examination  Vitamin D deficiency  Class 1 obesity due to excess calories without serious comorbidity with body mass index (BMI) of 30.0 to 30.9 in adult - Plan: Hemoglobin A1c  Hyperlipidemia, unspecified hyperlipidemia type - Plan: Lipid Panel  Vitamin B 12 deficiency - Plan: Vitamin B12  -Discussed and advised all [place] preventive services health task force level A and B recommendations for age, sex and risks.  -Advised at least 150 minutes of exercise per week and a healthy diet with avoidance of (less then 1 serving per week) processed foods, white starches, red meat, fast foods and sweets and consisting of: * 5-9 servings of fresh fruits and vegetables (not corn or potatoes) *nuts and seeds, beans *olives and olive oil *lean meats such as fish and white chicken  *whole grains  -labs, studies and vaccines per orders this encounter  Orders Placed This Encounter  Procedures  . Vitamin B12  . Lipid Panel  . Hemoglobin A1c    Patient advised to return to clinic immediately if symptoms worsen or persist or new concerns.  Patient Instructions   BEFORE YOU LEAVE: -tetanus booster -labs -follow up: 6 months and as needed  Source naturals Vit D3 drops were approved by consumer labs in a recent report.  We have ordered labs or studies at this visit. It can take up to 1-2 weeks for results and processing. IF results require follow up or explanation, we will call you with instructions. Clinically stable results will be released to your [facility]. If you have not heard from [place] or cannot find your results in [facility] in 2 weeks please contact our office at [phone].  If you are not yet signed up for [facility], please consider signing up.   We recommend the following healthy lifestyle for LIFE: 1) Small portions.   Tip: eat off of a salad plate instead of a dinner  plate.  Tip: It is ok to feel hungry after a meal if you had proper portion sizes  Tip: if you need more or a snack choose fruits, veggies and/or a handful of nuts or seeds.  2) Eat a healthy clean diet.  * Tip: Avoid (less then 1 serving per week): processed foods, sweets, sweetened drinks, white starches (rice, flour, bread, potatoes, pasta, etc), red meat, fast foods, butter  *Tip: CHOOSE instead   * 5-9 servings per day of fresh or frozen fruits and vegetables (but not corn, potatoes, bananas, canned or dried fruit)   *nuts and seeds, beans   *olives and olive oil   *small portions of lean meats such as fish and white chicken    *small portions of whole grains  3)Get at least 150 minutes of sweaty aerobic exercise per week.  4)Reduce stress - consider counseling, meditation and relaxation to balance other aspects of your life.         No Follow-up on file.  Colin Benton R., DO

## 2016-02-22 ENCOUNTER — Encounter: Payer: Self-pay | Admitting: Family Medicine

## 2016-02-22 ENCOUNTER — Ambulatory Visit (INDEPENDENT_AMBULATORY_CARE_PROVIDER_SITE_OTHER): Payer: BLUE CROSS/BLUE SHIELD | Admitting: Family Medicine

## 2016-02-22 ENCOUNTER — Encounter: Payer: BLUE CROSS/BLUE SHIELD | Admitting: Family Medicine

## 2016-02-22 VITALS — BP 102/68 | HR 74 | Temp 97.8°F | Ht 66.25 in | Wt 192.9 lb

## 2016-02-22 DIAGNOSIS — E538 Deficiency of other specified B group vitamins: Secondary | ICD-10-CM | POA: Diagnosis not present

## 2016-02-22 DIAGNOSIS — E6609 Other obesity due to excess calories: Secondary | ICD-10-CM

## 2016-02-22 DIAGNOSIS — E559 Vitamin D deficiency, unspecified: Secondary | ICD-10-CM | POA: Diagnosis not present

## 2016-02-22 DIAGNOSIS — Z23 Encounter for immunization: Secondary | ICD-10-CM | POA: Diagnosis not present

## 2016-02-22 DIAGNOSIS — E785 Hyperlipidemia, unspecified: Secondary | ICD-10-CM

## 2016-02-22 DIAGNOSIS — Z0000 Encounter for general adult medical examination without abnormal findings: Secondary | ICD-10-CM

## 2016-02-22 DIAGNOSIS — Z6830 Body mass index (BMI) 30.0-30.9, adult: Secondary | ICD-10-CM | POA: Diagnosis not present

## 2016-02-22 LAB — VITAMIN B12: Vitamin B-12: 298 pg/mL (ref 211–911)

## 2016-02-22 LAB — LIPID PANEL
CHOLESTEROL: 191 mg/dL (ref 0–200)
HDL: 65.3 mg/dL (ref >39.00)
LDL Cholesterol: 112 mg/dL — ABNORMAL HIGH (ref 0–99)
NonHDL: 125.26
TRIGLYCERIDES: 67 mg/dL (ref 0.0–149.0)
Total CHOL/HDL Ratio: 3
VLDL: 13.4 mg/dL (ref 0.0–40.0)

## 2016-02-22 LAB — HEMOGLOBIN A1C: Hgb A1c MFr Bld: 5.6 % (ref 4.6–6.5)

## 2016-02-22 NOTE — Progress Notes (Signed)
Pre visit review using our clinic review tool, if applicable. No additional management support is needed unless otherwise documented below in the visit note. 

## 2016-02-22 NOTE — Addendum Note (Signed)
Addended by: Agnes Lawrence on: 02/22/2016 09:10 AM   Modules accepted: Orders

## 2016-02-22 NOTE — Patient Instructions (Addendum)
BEFORE YOU LEAVE: -tetanus booster -labs -follow up: 6 months and as needed  Source naturals Vit D3 drops were approved by consumer labs in a recent report.  We have ordered labs or studies at this visit. It can take up to 1-2 weeks for results and processing. IF results require follow up or explanation, we will call you with instructions. Clinically stable results will be released to your [facility]. If you have not heard from [place] or cannot find your results in [facility] in 2 weeks please contact our office at [phone].  If you are not yet signed up for [facility], please consider signing up.   We recommend the following healthy lifestyle for LIFE: 1) Small portions.   Tip: eat off of a salad plate instead of a dinner plate.  Tip: It is ok to feel hungry after a meal if you had proper portion sizes  Tip: if you need more or a snack choose fruits, veggies and/or a handful of nuts or seeds.  2) Eat a healthy clean diet.  * Tip: Avoid (less then 1 serving per week): processed foods, sweets, sweetened drinks, white starches (rice, flour, bread, potatoes, pasta, etc), red meat, fast foods, butter  *Tip: CHOOSE instead   * 5-9 servings per day of fresh or frozen fruits and vegetables (but not corn, potatoes, bananas, canned or dried fruit)   *nuts and seeds, beans   *olives and olive oil   *small portions of lean meats such as fish and white chicken    *small portions of whole grains  3)Get at least 150 minutes of sweaty aerobic exercise per week.  4)Reduce stress - consider counseling, meditation and relaxation to balance other aspects of your life.

## 2016-03-08 ENCOUNTER — Telehealth: Payer: Self-pay | Admitting: Internal Medicine

## 2016-03-08 ENCOUNTER — Encounter: Payer: Self-pay | Admitting: Internal Medicine

## 2016-03-08 ENCOUNTER — Ambulatory Visit (INDEPENDENT_AMBULATORY_CARE_PROVIDER_SITE_OTHER): Payer: BLUE CROSS/BLUE SHIELD | Admitting: Internal Medicine

## 2016-03-08 ENCOUNTER — Other Ambulatory Visit: Payer: Self-pay | Admitting: Internal Medicine

## 2016-03-08 VITALS — BP 140/88 | HR 64 | Wt 192.0 lb

## 2016-03-08 DIAGNOSIS — E538 Deficiency of other specified B group vitamins: Secondary | ICD-10-CM | POA: Diagnosis not present

## 2016-03-08 DIAGNOSIS — E038 Other specified hypothyroidism: Secondary | ICD-10-CM

## 2016-03-08 LAB — TSH: TSH: 1.6 u[IU]/mL (ref 0.35–4.50)

## 2016-03-08 LAB — T4, FREE: Free T4: 0.62 ng/dL (ref 0.60–1.60)

## 2016-03-08 LAB — T3, FREE: T3 FREE: 2.5 pg/mL (ref 2.3–4.2)

## 2016-03-08 MED ORDER — NATURE-THROID 65 MG PO TABS: 65.0000 mg | ORAL_TABLET | Freq: Every day | ORAL | 3 refills | Status: DC

## 2016-03-08 NOTE — Telephone Encounter (Signed)
Pt found that [facility] at [address] has the nature thyroid med can we please call in the 3 month supply there and refills for the year thank you

## 2016-03-08 NOTE — Patient Instructions (Addendum)
Please increase the B12 dose - options: 1. B12 injections once a month 2. B12 sublingual tablets: 5000 mcg daily 3. B12 sublingual drops 2000-3000 mcg daily.  Let's recheck a B12 2-3 months after the dose increase.   Please continue NatureThroid 65 mg daily.  Take the thyroid hormone every day, with water, at least 30 minutes before breakfast, separated by at least 4 hours from: - acid reflux medications - calcium - iron - multivitamins  Please return in 1 year.

## 2016-03-08 NOTE — Progress Notes (Signed)
Patient ID: Jacqueline Jennings, female   DOB: Jan 22, 1951, 65 y.o.   MRN: [id number]   HPI  Jacqueline Jennings is a 65 y.o.-year-old female, returning for follow-up for hypothyroidism. Last visit 1 year ago.   Since last visit, eliminated eggs from diet >> lost 34 lbs. She also started Weight Watchers. She continues exercise.  Reviewed history: Pt. has been dx with hypothyroidism in 2007-2008; started on Synthroid >> not feeling better >> was on Armour >> now Naturethyroid 65 mg.  Her TSH was overly suppressed in the past, as shown below. She was initially on a high dose of Armour: 120 mg, which we subsequently decreased to 90 mg, and then further to 60 mg >> NatureThroid 65. Labs normalized on this dose.  She takes the NT: - fasting - with water - separated by >30 min from b'fast  - no calcium - no iron, PPIs, multivitamins   Revieweing her TFTs: Lab Results  Component Value Date   TSH 1.87 03/09/2015   TSH 2.39 09/08/2014   TSH 1.67 05/24/2014   TSH 1.62 03/01/2014   TSH 0.03 (L) 01/12/2014   TSH 0.05 (L) 11/23/2013   TSH 0.05 (L) 02/01/2013   TSH 0.10 (L) 05/26/2012   TSH 0.04 (L) 02/20/2012   TSH 0.04 (L) 09/03/2011   FREET4 0.67 03/09/2015   FREET4 0.69 09/08/2014   FREET4 0.79 05/24/2014   FREET4 0.73 03/01/2014   FREET4 0.77 01/12/2014   FREET4 1.02 11/23/2013   FREET4 0.89 05/26/2012   FREET4 0.92 02/20/2012   FREET4 0.87 04/18/2011   FREET4 0.76 01/14/2011    Pt describes: - no fatigue - + weight loss - no tremors - no palpitations - no constipation - no dry skin - no hair loss - no diarrhea/constipation  Pt denies feeling nodules in neck, hoarseness, dysphagia/odynophagia, SOB with lying down.  We also diagnosed B12 deficiency in the past, and she has been on sublingual B12 tablets thousand micrograms daily. However, the levels stay low: Component     Latest Ref Rng & Units 09/14/2014 12/14/2014 03/09/2015 02/22/2016  Vitamin B12     211 - 911 pg/mL  210 (L) 280 337 298   She also has a history of BrCa 2006 >> had RxTx to R Breast.    ROS: Constitutional: see HPI Eyes: no blurry vision, no xerophthalmia ENT: no sore throat, no nodules palpated in throat, no dysphagia/odynophagia, no hoarseness Cardiovascular: no CP/SOB/palpitations/leg swelling Respiratory: no cough/SOB Gastrointestinal: no N/V/D/C Musculoskeletal: no muscle/joint aches Skin: no rashes, no easy bruising, no hair loss Neurological: no tremors/numbness/tingling/dizziness  I reviewed pt's medications, allergies, PMH, social hx, family hx, and changes were documented in the history of present illness. Otherwise, unchanged from my initial visit note.  PE: BP 140/88   Pulse 64   Wt 192 lb (87.1 kg)   SpO2 98%   BMI 30.76 kg/m  Body mass index is 30.76 kg/m. Wt Readings from Last 3 Encounters:  03/08/16 192 lb (87.1 kg)  02/22/16 192 lb 14.4 oz (87.5 kg)  12/19/15 189 lb 6.4 oz (85.9 kg)   Constitutional: Overweight, in NAD Eyes: PERRLA, EOMI, no exophthalmos ENT: moist mucous membranes, no thyromegaly, no cervical lymphadenopathy Cardiovascular: RRR, No MRG Respiratory: CTA B Gastrointestinal: abdomen soft, NT, ND, BS+ Musculoskeletal: no deformities, strength intact in all 4 Skin: warm, no rashes Neurological: no tremor with outstretched hands, DTR normal in all 4  ASSESSMENT: 1. Hypothyroidism - on desiccated thyroid extract  2. B12 def  PLAN:  1. Patient with long-standing hypothyroidism, on NatureThroid 65 mg daily. She appears euthyroid, And feels much better after she lost weight  - we reviewed together all her thyroid labs >> normal 1 year ago - will check thyroid tests today: TSH, free T4, free t3 and will adjust the dose of Naturethroid as needed. - We discussed about the national shortage of Naturethroid and I hope that this would not affect her. However, we decided to switch to Armour thyroid if she cannot get the Naturethroid. Another  option would be a combination of T4 and T3 synthetic hormones, but she would not want to do this for now. - she knows how to take her thyroid hormones correctly, fasting, with water, separated by at least 30 minutes from breakfast, and separated by more than 4 hours from calcium, iron, multivitamins, acid reflux medications (PPIs). She is taking it correctly  2. B12 def - The B12 level is still low despite by mouth supplementation with 1000 micrograms daily. We discussed about other options for supplementation, and we'll have her back in 2-3 months to recheck a level: Patient Instructions  Please increase the B12 dose - options: 1. B12 injections once a month 2. B12 sublingual tablets: 5000 mcg daily 3. B12 sublingual drops 2000-3000 mcg daily.  Let's recheck a B12 2-3 months after the dose increase.   Please continue NatureThroid 65 mg daily.  Take the thyroid hormone every day, with water, at least 30 minutes before breakfast, separated by at least 4 hours from: - acid reflux medications - calcium - iron - multivitamins  Please return in 1 year.  Office Visit on 03/08/2016  Component Date Value Ref Range Status  . Free T4 03/08/2016 0.62  0.60 - 1.60 ng/dL Final   Comment: Specimens from patients who are undergoing biotin therapy and /or ingesting biotin supplements may contain high levels of biotin.  The higher biotin concentration in these specimens interferes with this Free T4 assay.  Specimens that contain high levels  of biotin may cause false high results for this Free T4 assay.  Please interpret results in light of the total clinical presentation of the patient.    . T3, Free 03/08/2016 2.5  2.3 - 4.2 pg/mL Final  . TSH 03/08/2016 1.60  0.35 - 4.50 uIU/mL Final   Thyroid labs are great!  Philemon Kingdom, MD PhD [facility]

## 2016-03-13 NOTE — Telephone Encounter (Signed)
Refill submitted on 03/08/2016.

## 2016-06-07 ENCOUNTER — Other Ambulatory Visit (INDEPENDENT_AMBULATORY_CARE_PROVIDER_SITE_OTHER): Payer: BLUE CROSS/BLUE SHIELD

## 2016-06-07 DIAGNOSIS — E538 Deficiency of other specified B group vitamins: Secondary | ICD-10-CM | POA: Diagnosis not present

## 2016-06-07 LAB — VITAMIN B12: VITAMIN B 12: 759 pg/mL (ref 211–911)

## 2016-06-10 ENCOUNTER — Encounter: Payer: Self-pay | Admitting: Internal Medicine

## 2016-08-12 ENCOUNTER — Encounter: Payer: Self-pay | Admitting: Family Medicine

## 2016-08-12 ENCOUNTER — Ambulatory Visit (INDEPENDENT_AMBULATORY_CARE_PROVIDER_SITE_OTHER): Payer: BLUE CROSS/BLUE SHIELD | Admitting: Family Medicine

## 2016-08-12 VITALS — BP 122/80 | HR 78 | Temp 97.4°F | Ht 66.25 in | Wt 204.0 lb

## 2016-08-12 DIAGNOSIS — R300 Dysuria: Secondary | ICD-10-CM | POA: Diagnosis not present

## 2016-08-12 DIAGNOSIS — Z634 Disappearance and death of family member: Secondary | ICD-10-CM | POA: Diagnosis not present

## 2016-08-12 LAB — POCT URINALYSIS DIPSTICK
BILIRUBIN UA: NEGATIVE
GLUCOSE UA: NEGATIVE
KETONES UA: NEGATIVE
Nitrite, UA: NEGATIVE
Protein, UA: NEGATIVE
Spec Grav, UA: <=1.005 — AB (ref 1.010–1.025)
Urobilinogen, UA: 0.2 E.U./dL
pH, UA: 6.5 (ref 5.0–8.0)

## 2016-08-12 MED ORDER — NITROFURANTOIN MONOHYD MACRO 100 MG PO CAPS: 100.0000 mg | ORAL_CAPSULE | Freq: Two times a day (BID) | ORAL | 0 refills | Status: DC

## 2016-08-12 NOTE — Progress Notes (Signed)
Pre visit review using our clinic review tool, if applicable. No additional management support is needed unless otherwise documented below in the visit note. 

## 2016-08-12 NOTE — Progress Notes (Signed)
HPI:  Acute visit for Dysuria: -started 2 days ago -symptoms include: burning with urinary, frequency -denies: fevers, malaise, NVD, hematuria, flank pain/abd pain, vaginal symptoms  Bereavement: -lost husband to esophageal ca a few weeks ago -has good support -plugged in with [facility] -strong christian faith  ROS: See pertinent positives and negatives per HPI.  Past Medical History:  Diagnosis Date  . Acute medial meniscal tear 10/02/2011  . Arthritis 09-27-11   Right knee/Cortisone injection 2'13  . Breast cancer ([place])    hx of with rediation, surgery and adjuvant theapy with tamoxifen  (year two)  . Colon polyps 2014  . Hyperlipidemia   . Hypothyroidism 09-27-11   Low function  . IBS (irritable bowel syndrome)   . Nephrolithiasis    hx of  . NEPHROLITHIASIS, HX OF 11/26/2006   Qualifier: Diagnosis of  By: Jimmye Norman, LPN, [place] WHETHER GEN/LOC [facility] SITE 02/28/2009   Qualifier: Diagnosis of  By: Arnoldo Morale MD, Balinda Quails Sleep apnea 09-27-11   Cpap used ,started 1'9509    Past Surgical History:  Procedure Laterality Date  . BREAST BIOPSY Right 1977  . BREAST LUMPECTOMY Right 2006   raditon 33 tx-right  . CATARACT EXTRACTION Bilateral 2008  . CYSTOSCOPY  1977  . KNEE ARTHROSCOPY  10/02/2011   Procedure: ARTHROSCOPY KNEE;  Surgeon: Gearlean Alf, MD;  Location: [facility];  Service: Orthopedics;  Laterality: Right;  with Debridement, right knee medial meniscus  . MYOMECTOMY  1990  . TONSILLECTOMY      Family History  Problem Relation Age of Onset  . Hypertension Mother   . Leukemia Mother   . Colon cancer Neg Hx   . Diabetes Father   . Uterine cancer Maternal Grandmother     Social History   Social History  . Marital status: Married    Spouse name: N/A  . Number of children: 0  . Years of education: N/A   Social History Main Topics  . Smoking status: Never Smoker  . Smokeless tobacco: Never Used  . Alcohol use 0.0  oz/week     Comment: rare occ  . Drug use: No  . Sexual activity: Yes   Other Topics Concern  . 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  Cholecalciferol (VITAMIN D-3 PO), Take 1,000 Units by mouth daily. , Disp: , Rfl:  .  Cyanocobalamin (VITAMIN B-12 PO), Take 1,000 mg by mouth daily. , Disp: , Rfl:  .  fexofenadine (ALLEGRA) 180 MG tablet, Take 180 mg by mouth daily., Disp: , Rfl:  .  fluticasone (FLONASE) 50 MCG/ACT nasal spray, Place into both nostrils daily., Disp: , Rfl:  .  metroNIDAZOLE (METROGEL) 0.75 % gel, , Disp: , Rfl: 98 .  NATURE-THROID 65 MG tablet, Take 1 tablet (65 mg total) by mouth daily., Disp: 90 tablet, Rfl: 3 .  Pseudoephedrine HCl (SUDAFED PO), Take by mouth as needed., Disp: , Rfl:  .  nitrofurantoin, macrocrystal-monohydrate, (MACROBID) 100 MG capsule, Take 1 capsule (100 mg total) by mouth 2 (two) times daily., Disp: 14 capsule, Rfl: 0  EXAM:  Vitals:   08/12/16  1047  BP: 122/80  Pulse: 78  Temp: 97.4 F (36.3 C)    Body mass index is 32.68 kg/m.  GENERAL: vitals reviewed and listed above, alert, oriented, appears well hydrated and in no acute distress  HEENT: atraumatic, conjunttiva clear, no obvious abnormalities on inspection of external nose and ears  NECK: no obvious masses on inspection  LUNGS: clear to auscultation bilaterally, no wheezes, rales or rhonchi, good air movement  CV: HRRR, no peripheral edema  ABD: BS+, soft, NTTP, no CVA TTP  MS: moves all extremities without noticeable abnormality  PSYCH: pleasant and cooperative, no obvious depression or anxiety  ASSESSMENT AND PLAN:  Discussed the following assessment and plan:  Dysuria - Plan: POC Urinalysis Dipstick -udip + smx c/w likely UTI -she  wants to start abx after risks discussed -return precautions  Bereavement -supported -offered help  -Patient advised to return or notify a doctor immediately if symptoms worsen or persist or new concerns arise.  There are no Patient Instructions on file for this visit.  Colin Benton R., DO

## 2016-10-25 DIAGNOSIS — H43813 Vitreous degeneration, bilateral: Secondary | ICD-10-CM | POA: Diagnosis not present

## 2016-10-25 DIAGNOSIS — H52203 Unspecified astigmatism, bilateral: Secondary | ICD-10-CM | POA: Diagnosis not present

## 2016-10-25 DIAGNOSIS — H04123 Dry eye syndrome of bilateral lacrimal glands: Secondary | ICD-10-CM | POA: Diagnosis not present

## 2016-10-25 DIAGNOSIS — H01001 Unspecified blepharitis right upper eyelid: Secondary | ICD-10-CM | POA: Diagnosis not present

## 2016-11-13 DIAGNOSIS — Z853 Personal history of malignant neoplasm of breast: Secondary | ICD-10-CM | POA: Diagnosis not present

## 2016-11-13 DIAGNOSIS — Z1231 Encounter for screening mammogram for malignant neoplasm of breast: Secondary | ICD-10-CM | POA: Diagnosis not present

## 2016-11-13 LAB — HM MAMMOGRAPHY

## 2016-11-26 ENCOUNTER — Encounter: Payer: Self-pay | Admitting: Family Medicine

## 2016-12-31 ENCOUNTER — Encounter: Payer: Self-pay | Admitting: Family Medicine

## 2016-12-31 ENCOUNTER — Ambulatory Visit (INDEPENDENT_AMBULATORY_CARE_PROVIDER_SITE_OTHER): Payer: Medicare Other | Admitting: Family Medicine

## 2016-12-31 VITALS — BP 120/80 | HR 70 | Temp 98.0°F | Ht 66.25 in | Wt 199.2 lb

## 2016-12-31 DIAGNOSIS — R300 Dysuria: Secondary | ICD-10-CM

## 2016-12-31 LAB — POCT URINALYSIS DIPSTICK
Bilirubin, UA: NEGATIVE
Glucose, UA: NEGATIVE
KETONES UA: NEGATIVE
Nitrite, UA: NEGATIVE
PH UA: 7 (ref 5.0–8.0)
PROTEIN UA: NEGATIVE
Urobilinogen, UA: 0.2 E.U./dL

## 2016-12-31 MED ORDER — NITROFURANTOIN MONOHYD MACRO 100 MG PO CAPS: 100.0000 mg | ORAL_CAPSULE | Freq: Two times a day (BID) | ORAL | 0 refills | Status: DC

## 2016-12-31 NOTE — Progress Notes (Signed)
HPI:  Acute visit for Dysuria: -started today -burning with urination, frequency, urgency -denies: nausea, vomiting, fevers, malaise, flank pain, hematuria, vaginal discharge  ROS: See pertinent positives and negatives per HPI.  Past Medical History:  Diagnosis Date  . Acute medial meniscal tear 10/02/2011  . Arthritis 09-27-11   Right knee/Cortisone injection 2'13  . Breast cancer ([place])    hx of with rediation, surgery and adjuvant theapy with tamoxifen  (year two)  . Colon polyps 2014  . Hyperlipidemia   . Hypothyroidism 09-27-11   Low function  . IBS (irritable bowel syndrome)   . Nephrolithiasis    hx of  . NEPHROLITHIASIS, HX OF 11/26/2006   Qualifier: Diagnosis of  By: Jimmye Norman, LPN, [place] WHETHER GEN/LOC [facility] SITE 02/28/2009   Qualifier: Diagnosis of  By: Arnoldo Morale MD, Balinda Quails Sleep apnea 09-27-11   Cpap used ,started 1'3244    Past Surgical History:  Procedure Laterality Date  . BREAST BIOPSY Right 1977  . BREAST LUMPECTOMY Right 2006   raditon 33 tx-right  . CATARACT EXTRACTION Bilateral 2008  . CYSTOSCOPY  1977  . KNEE ARTHROSCOPY  10/02/2011   Procedure: ARTHROSCOPY KNEE;  Surgeon: Gearlean Alf, MD;  Location: [facility];  Service: Orthopedics;  Laterality: Right;  with Debridement, right knee medial meniscus  . MYOMECTOMY  1990  . TONSILLECTOMY      Family History  Problem Relation Age of Onset  . Hypertension Mother   . Leukemia Mother   . Colon cancer Neg Hx   . Diabetes Father   . Uterine cancer Maternal Grandmother     Social History   Social History  . Marital status: Married    Spouse name: N/A  . Number of children: 0  . Years of education: N/A   Social History Main Topics  . Smoking status: Never Smoker  . Smokeless tobacco: Never Used  . Alcohol use 0.0 oz/week     Comment: rare occ  . Drug use: No  . Sexual activity: Yes   Other Topics Concern  . 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  Cholecalciferol (VITAMIN D-3 PO), Take 1,000 Units by mouth daily. , Disp: , Rfl:  .  Cyanocobalamin (VITAMIN B-12 PO), Take 1,000 mg by mouth daily. , Disp: , Rfl:  .  fexofenadine (ALLEGRA) 180 MG tablet, Take 180 mg by mouth daily., Disp: , Rfl:  .  fluticasone (FLONASE) 50 MCG/ACT nasal spray, Place into both nostrils daily., Disp: , Rfl:  .  metroNIDAZOLE (METROGEL) 0.75 % gel, , Disp: , Rfl: 98 .  NATURE-THROID 65 MG tablet, Take 1 tablet (65 mg total) by mouth daily., Disp: 90 tablet, Rfl: 3 .  nitrofurantoin, macrocrystal-monohydrate, (MACROBID) 100 MG capsule, Take 1 capsule (100 mg total) by mouth 2 (two) times daily., Disp: 14 capsule, Rfl: 0  EXAM:  Vitals:   12/31/16 1307  BP: 120/80  Pulse: 70  Temp: 98 F (36.7 C)    Body mass index is 31.91 kg/m.  GENERAL: vitals reviewed and listed above, alert, oriented, appears well hydrated and in no acute distress  HEENT: atraumatic, conjunttiva  clear, no obvious abnormalities on inspection of external nose and ears  NECK: no obvious masses on inspection  LUNGS: clear to auscultation bilaterally, no wheezes, rales or rhonchi, good air movement  CV: HRRR, no peripheral edema  ABD: BS+, soft, NTTP, no CVA TTP  MS: moves all extremities without noticeable abnormality  PSYCH: pleasant and cooperative, no obvious depression or anxiety  ASSESSMENT AND PLAN:  Discussed the following assessment and plan:  Dysuria - Plan: POC Urinalysis Dipstick  -udip + symptoms c/w uti, opted to start abx after discussion risks, benefits -culture pending -Patient advised to return or notify a doctor immediately if symptoms worsen or persist or new concerns arise.  Patient Instructions  BEFORE YOU LEAVE: -follow up: Welcome  to Medicare visit with Dr. Maudie Mercury in next 1-2 months  Start the antibiotic (macrobid).  I hope you are feeling better soon! Follow up sooner if worsening, new concerns or you are not improving with treatment.      Colin Benton R., DO

## 2016-12-31 NOTE — Patient Instructions (Signed)
BEFORE YOU LEAVE: -follow up: Welcome to Medicare visit with Dr. Maudie Mercury in next 1-2 months  Start the antibiotic (macrobid).  I hope you are feeling better soon! Follow up sooner if worsening, new concerns or you are not improving with treatment.

## 2017-01-08 ENCOUNTER — Encounter: Payer: Self-pay | Admitting: Pulmonary Disease

## 2017-01-08 ENCOUNTER — Ambulatory Visit (INDEPENDENT_AMBULATORY_CARE_PROVIDER_SITE_OTHER): Payer: Medicare Other | Admitting: Pulmonary Disease

## 2017-01-08 VITALS — BP 120/72 | HR 77 | Ht 66.0 in | Wt 194.6 lb

## 2017-01-08 DIAGNOSIS — G4733 Obstructive sleep apnea (adult) (pediatric): Secondary | ICD-10-CM | POA: Diagnosis not present

## 2017-01-08 DIAGNOSIS — Z9989 Dependence on other enabling machines and devices: Secondary | ICD-10-CM | POA: Diagnosis not present

## 2017-01-08 NOTE — Progress Notes (Signed)
Current Outpatient Prescriptions on File Prior to Visit  Medication Sig  . Cholecalciferol (VITAMIN D-3 PO) Take 1,000 Units by mouth daily.   . Cyanocobalamin (VITAMIN B-12 PO) Take 1,000 mg by mouth daily.   . fexofenadine (ALLEGRA) 180 MG tablet Take 180 mg by mouth daily.  . fluticasone (FLONASE) 50 MCG/ACT nasal spray Place into both nostrils daily.  . metroNIDAZOLE (METROGEL) 0.75 % gel   . NATURE-THROID 65 MG tablet Take 1 tablet (65 mg total) by mouth daily.   No current facility-administered medications on file prior to visit.      Chief Complaint  Patient presents with  . Follow-up    CPAP - [facility]; Pt is in need of new CPAP supplies and nasal pillows.      Sleep tests PSG 02/01/09 >> AHI 11 Auto CPAP 08/28/16 to 09/26/16 >> used on 30 of 30 nights with average 8 hrs 21 min.  Average AHI 0 with median CPAP 10 and 95 th percentile CPAP 11 cm H2O  Past medical history Breast cancer, HLD. Hypothyroidism, IBS, Nephrolithiasis, OA  Past surgical history, Family history, Social history, Allergies all reviewed.  Vital Signs BP 120/72 (BP Location: Left Arm, Cuff Size: Normal)   Pulse 77   Ht 5\' 6"  (1.676 m)   Wt 194 lb 9.6 oz (88.3 kg)   SpO2 97%   BMI 31.41 kg/m   History of Present Illness Mykell Genao is a 66 y.o. female with OSA.  She was initially seen by Dr. Gwenette Greet and then Dr. Corrie Dandy.  She had sleep study in 2010 and found to have mild sleep apnea.  She has been using CPAP.  This helps, but she doesn't enjoy having to use it.  She needs new supplies.  She was not fully aware of other options to treat sleep apnea.  Physical Exam  General - No distress ENT - No sinus tenderness, no oral exudate, no LAN, MP 3 Cardiac - s1s2 regular, no murmur Chest - No wheeze/rales/dullness Back - No focal tenderness Abd - Soft, non-tender Ext - No edema Neuro - Normal strength Skin - No rashes Psych - normal mood, and behavior   Assessment/Plan  Obstructive  sleep apnea. - she is compliant with therapy and reports benefit from CPAP - will arrange for new CPAP supplies - had extensive discussion with her about alternative therapies to CPAP - she will check with her dentist first about whether she could be a candidate for an oral appliance   Patient Instructions  Ask your dentist about whether you could be a candidate for an oral appliance to treat sleep apnea  Will arrange for new CPAP supplies  Follow up in 1 year  Time spent 32 minutes  Chesley Mires, MD [place] [facility] Pager:  [phone] 01/08/2017, 4:02 PM

## 2017-01-08 NOTE — Patient Instructions (Signed)
Ask your dentist about whether you could be a candidate for an oral appliance to treat sleep apnea  Will arrange for new CPAP supplies  Follow up in 1 year

## 2017-01-09 ENCOUNTER — Encounter: Payer: Self-pay | Admitting: Family Medicine

## 2017-02-13 NOTE — Progress Notes (Signed)
Medicare Annual Preventive Care Visit  (initial annual wellness or annual wellness exam)  Concerns and/or follow up today: Due for flu shot, Prevnar 13, Pap smear,labs Lavelle Berland is a pleasant 66 year old with a past medical history significant for obesity, sleep apnea( sees pulmonologist), hypothyroidism( sees endocrinologist), IBS and a remote history of breast cancer ( sees gynecologist for breast exams and women's health) here for her annual wellness visit. This is her first Medicare visit. She reports she is doing well overall. She is still grieving the loss of her husband. He passed in April 2018. She is undergoing grief counseling and is doing better. Her diet hasn't been as good. She has been trying to get some exercise. See scanned documentation. She sees Patria Mane and gynecology for her Pap smears, breast exams and women's health exams. She is due for her pneumonia and flu shot today. She is fasting for lab work. She sees Dr. Cruzita Lederer for her thyroid. She has an appointment with her next month. I do not prescribe any opioid pain medications for this patient. See HM section in Epic for other details of completed HM. See scanned documentation under Media Tab for further documentation HPI, health risk assessment. See Media Tab and Care Teams sections in Epic for other providers.  ROS: negative for report of fevers, unintentional weight loss, vision changes, vision loss, hearing loss or change, chest pain, sob, hemoptysis, melena, hematochezia, hematuria, genital discharge or lesions, falls, bleeding or bruising, loc, thoughts of suicide or self harm, memory loss  1.) Patient-completed health risk assessment  - completed and reviewed, see scanned documentation  2.) Review of Medical History: -PMH, PSH, Family History and current specialty and care providers reviewed and updated and listed below  - see scanned in document in chart and below  Past Medical History:  Diagnosis  Date  . Acute medial meniscal tear 10/02/2011  . Arthritis 09-27-11   Right knee/Cortisone injection 2'13  . Breast cancer ([place])    hx of with rediation, surgery and adjuvant theapy with tamoxifen  (year two)  . Colon polyps 2014  . Hyperlipidemia   . Hypothyroidism 09-27-11   Low function  . IBS (irritable bowel syndrome)   . Nephrolithiasis    hx of  . NEPHROLITHIASIS, HX OF 11/26/2006   Qualifier: Diagnosis of  By: Jimmye Norman, LPN, [place] WHETHER GEN/LOC [facility] SITE 02/28/2009   Qualifier: Diagnosis of  By: Arnoldo Morale MD, Balinda Quails Sleep apnea 09-27-11   Cpap used ,started 9'2426    Past Surgical History:  Procedure Laterality Date  . BREAST BIOPSY Right 1977  . BREAST LUMPECTOMY Right 2006   raditon 33 tx-right  . CATARACT EXTRACTION Bilateral 2008  . CYSTOSCOPY  1977  . KNEE ARTHROSCOPY  10/02/2011   Procedure: ARTHROSCOPY KNEE;  Surgeon: Gearlean Alf, MD;  Location: [facility];  Service: Orthopedics;  Laterality: Right;  with Debridement, right knee medial meniscus  . MYOMECTOMY  1990  . TONSILLECTOMY      Social History   Social History  . Marital status: Married    Spouse name: N/A  . Number of children: 0  . Years of education: N/A   Occupational History  . Not on file.   Social History Main Topics  . Smoking status: Never Smoker  . Smokeless tobacco: Never Used  . Alcohol use 0.0 oz/week     Comment: rare occ  . Drug use: No  . Sexual activity: Yes  Other Topics Concern  . Not on file   Social History Narrative   Work or School: retired Education officer, museum, travels a lot      Home Situation: lives with husband      Spiritual Beliefs: Christian      Lifestyle: no regular exercise, healthy diet             Family History  Problem Relation Age of Onset  . Hypertension Mother   . Leukemia Mother   . Diabetes Father   . Uterine cancer Maternal Grandmother   . Colon cancer Neg Hx     Current Outpatient Prescriptions on File Prior  to Visit  Medication Sig Dispense Refill  . Cholecalciferol (VITAMIN D-3 PO) Take 1,000 Units by mouth daily.     . Cyanocobalamin (VITAMIN B-12 PO) Take 1,000 mg by mouth daily.     . fexofenadine (ALLEGRA) 180 MG tablet Take 180 mg by mouth daily.    . fluticasone (FLONASE) 50 MCG/ACT nasal spray Place into both nostrils as needed.     . metroNIDAZOLE (METROGEL) 0.75 % gel   98   No current facility-administered medications on file prior to visit.      3.) Review of functional ability and level of safety:  Any difficulty hearing?  See scanned documentation  History of falling?  See scanned documentation  Any trouble with IADLs - using a phone, using transportation, grocery shopping, preparing meals, doing housework, doing laundry, taking medications and managing money?  See scanned documentation  Advance Directives? yes  Discussed briefly and offered more resources and detailed discussion with our trained staff.   See summary of recommendations in Patient Instructions below.  4.) Physical Exam Vitals:   02/14/17 0857  BP: 110/80  Pulse: 70  Temp: 97.8 F (36.6 C)   Estimated body mass index is 32.86 kg/m as calculated from the following:   Height as of this encounter: 5\' 6"  (1.676 m).   Weight as of this encounter: 203 lb 9.6 oz (92.4 kg).  EKG (optional): deferred  General: alert, appear well hydrated and in no acute distress  HEENT: visual acuity grossly intact  CV: HRRR  Lungs: CTA bilaterally  Psych: pleasant and cooperative, no obvious depression or anxiety  Cognitive function grossly intact  See patient instructions for recommendations.  Education and counseling regarding the above review of health provided with a plan for the following: -see scanned patient completed form for further details -fall prevention strategies discussed  -healthy lifestyle discussed -importance and resources for completing advanced directives discussed -see patient  instructions below for any other recommendations provided  4)The following written screening schedule of preventive measures were reviewed with assessment and plan made per below, orders and patient instructions:           Alcohol screening done     Obesity Screening and counseling done     STI screening (Hep C if born [birth year]-65) offered and per pt wishes     Tobacco Screening done        Pneumococcal (PPSV23 -one dose after 64, one before if risk factors), influenza yearly and hepatitis B vaccines (if high risk - end stage renal disease, IV drugs, homosexual men, live in home for mentally retarded, hemophilia receiving factors) ASSESSMENT/PLAN: doing Prevnar 93 today      Screening mammograph (yearly if >40) ASSESSMENT/PLAN: she sees Dr. Tressia Danas for her breast exams and annual gyn health exams      Screening Pap smear/pelvic exam (q2 years) ASSESSMENT/PLAN:  n/a, declined - sees Dr. Tressia Danas      Colorectal cancer screening (FOBT yearly or flex sig q4y or colonoscopy q10y or barium enema q4y) ASSESSMENT/PLAN: last done in 2014, 5 year repeat advised, sees gastroenterologist for this and irritable bowel syndrome      Diabetes outpatient self-management training services ASSESSMENT/PLAN: utd or done      Bone mass measurements(covered q2y if indicated - estrogen def, osteoporosis, hyperparathyroid, vertebral abnormalities, osteoporosis or steroids) ASSESSMENT/PLAN: advise assistant order for patient after discussion with patient and per her preference is      Screening for glaucoma(q1y if high risk - diabetes, FH, AA and > 50 or hispanic and > 65) ASSESSMENT/PLAN: utd or advised      Medical nutritional therapy for individuals with diabetes or renal disease ASSESSMENT/PLAN: see orders      Cardiovascular screening blood tests (lipids q5y) ASSESSMENT/PLAN: see orders and labs      Diabetes screening tests ASSESSMENT/PLAN: see orders and labs   7.) Summary:   Welcome to  Medicare preventive visit -risk factors and conditions per above assessment were discussed and treatment, recommendations and referrals were offered per documentation above and orders and patient instructions.  Hyperlipidemia, unspecified hyperlipidemia type - Plan: Lipid panel BMI 32.0-32.9,adult -Lifestyle recommendations, advised increased aerobic exercise and healthy diet is low in sugar and simple starches and high in unprocessed vegetables, lean proteins and healthy fats  Estrogen deficiency -advised assistant to order bone density testing  B12 deficiency - Plan: Vitamin B12  Hypothyroidism, unspecified type - Plan: TSH, Sees Dr. Renne Crigler  for management  Hyperglycemia - Plan: Hemoglobin A1c  Patient Instructions   BEFORE YOU LEAVE: -Flu shot -Prevnar 13 -Please order her bone density test at [facility] -follow up:6 months  See her gynecologist as planned.  See her endocrinologist as planned.  We sent a referral for the bone density test. Please call in 1 week if you do not hear from [place] about this appointment.  Eat a healthy diet, low in sugar and simple starches. Get regular exercise.   Ms. Whitmore , Thank you for taking time to come for your Medicare Wellness Visit. I appreciate your ongoing commitment to your health goals. Please review the following plan we discussed and let me know if I can assist you in the future.   These are the goals we discussed: Goals    None      This is a list of the screening recommended for you and due dates:  Health Maintenance  Topic Date Due  . Pneumonia vaccines (1 of 2 - PCV13) Doing today  . Flu Shot  Doing today  . Pap Smear  Sees GYN  . HIV Screening  N/a*  . Colon Cancer Screening  09/08/2017  . Mammogram  11/13/2017  . Tetanus Vaccine  02/21/2026  . DEXA scan (bone density measurement)  Completed -ordered repeat today  .  Hepatitis C: One time screening is recommended by Center for Disease Control  (CDC) for  adults born  from [birth year] through 1964.   Completed  *Topic was postponed. The date shown is not the original due date.      Colin Benton R., DO

## 2017-02-14 ENCOUNTER — Other Ambulatory Visit: Payer: Self-pay | Admitting: Internal Medicine

## 2017-02-14 ENCOUNTER — Ambulatory Visit (INDEPENDENT_AMBULATORY_CARE_PROVIDER_SITE_OTHER)
Admission: RE | Admit: 2017-02-14 | Discharge: 2017-02-14 | Disposition: A | Discharge status: Home / Self Care | Payer: Medicare Other | Source: Ambulatory Visit | Attending: Family Medicine | Admitting: Family Medicine

## 2017-02-14 ENCOUNTER — Ambulatory Visit (INDEPENDENT_AMBULATORY_CARE_PROVIDER_SITE_OTHER): Payer: Medicare Other | Admitting: Family Medicine

## 2017-02-14 ENCOUNTER — Encounter: Payer: Self-pay | Admitting: Family Medicine

## 2017-02-14 VITALS — BP 110/80 | HR 70 | Temp 97.8°F | Ht 66.0 in | Wt 203.6 lb

## 2017-02-14 DIAGNOSIS — E039 Hypothyroidism, unspecified: Secondary | ICD-10-CM

## 2017-02-14 DIAGNOSIS — E785 Hyperlipidemia, unspecified: Secondary | ICD-10-CM | POA: Diagnosis not present

## 2017-02-14 DIAGNOSIS — E2839 Other primary ovarian failure: Secondary | ICD-10-CM | POA: Diagnosis not present

## 2017-02-14 DIAGNOSIS — E538 Deficiency of other specified B group vitamins: Secondary | ICD-10-CM | POA: Diagnosis not present

## 2017-02-14 DIAGNOSIS — R739 Hyperglycemia, unspecified: Secondary | ICD-10-CM

## 2017-02-14 DIAGNOSIS — Z23 Encounter for immunization: Secondary | ICD-10-CM | POA: Diagnosis not present

## 2017-02-14 DIAGNOSIS — Z0000 Encounter for general adult medical examination without abnormal findings: Secondary | ICD-10-CM

## 2017-02-14 DIAGNOSIS — Z6832 Body mass index (BMI) 32.0-32.9, adult: Secondary | ICD-10-CM | POA: Diagnosis not present

## 2017-02-14 LAB — HEMOGLOBIN A1C: HEMOGLOBIN A1C: 5.6 % (ref 4.6–6.5)

## 2017-02-14 LAB — LIPID PANEL
CHOLESTEROL: 196 mg/dL (ref 0–200)
HDL: 67 mg/dL (ref >39.00)
LDL CALC: 114 mg/dL — AB (ref 0–99)
NonHDL: 128.97
Total CHOL/HDL Ratio: 3
Triglycerides: 73 mg/dL (ref 0.0–149.0)
VLDL: 14.6 mg/dL (ref 0.0–40.0)

## 2017-02-14 LAB — VITAMIN B12: VITAMIN B 12: 982 pg/mL — AB (ref 211–911)

## 2017-02-14 NOTE — Patient Instructions (Signed)
BEFORE YOU LEAVE: -Flu shot -Prevnar 13 -Please order her bone density test at [facility] -follow up:6 months  See her gynecologist as planned.  See her endocrinologist as planned.  We sent a referral for the bone density test. Please call in 1 week if you do not hear from [place] about this appointment.  Eat a healthy diet, low in sugar and simple starches. Get regular exercise.   Jacqueline Jennings , Thank you for taking time to come for your Medicare Wellness Visit. I appreciate your ongoing commitment to your health goals. Please review the following plan we discussed and let me know if I can assist you in the future.   These are the goals we discussed: Goals    None      This is a list of the screening recommended for you and due dates:  Health Maintenance  Topic Date Due  . Pneumonia vaccines (1 of 2 - PCV13) Doing today  . Flu Shot  Doing today  . Pap Smear  Sees GYN  . HIV Screening  N/a*  . Colon Cancer Screening  09/08/2017  . Mammogram  11/13/2017  . Tetanus Vaccine  02/21/2026  . DEXA scan (bone density measurement)  Completed -ordered repeat today  .  Hepatitis C: One time screening is recommended by Center for Disease Control  (CDC) for  adults born from [birth year] through 1964.   Completed  *Topic was postponed. The date shown is not the original due date.

## 2017-02-17 DIAGNOSIS — Z01419 Encounter for gynecological examination (general) (routine) without abnormal findings: Secondary | ICD-10-CM | POA: Diagnosis not present

## 2017-02-17 DIAGNOSIS — Z6832 Body mass index (BMI) 32.0-32.9, adult: Secondary | ICD-10-CM | POA: Diagnosis not present

## 2017-02-26 DIAGNOSIS — L821 Other seborrheic keratosis: Secondary | ICD-10-CM | POA: Diagnosis not present

## 2017-02-26 DIAGNOSIS — Z1283 Encounter for screening for malignant neoplasm of skin: Secondary | ICD-10-CM | POA: Diagnosis not present

## 2017-03-07 ENCOUNTER — Ambulatory Visit (INDEPENDENT_AMBULATORY_CARE_PROVIDER_SITE_OTHER): Payer: Medicare Other | Admitting: Internal Medicine

## 2017-03-07 ENCOUNTER — Other Ambulatory Visit (INDEPENDENT_AMBULATORY_CARE_PROVIDER_SITE_OTHER): Payer: Medicare Other

## 2017-03-07 ENCOUNTER — Encounter: Payer: Self-pay | Admitting: Internal Medicine

## 2017-03-07 ENCOUNTER — Telehealth: Payer: Self-pay | Admitting: Pulmonary Disease

## 2017-03-07 VITALS — BP 120/72 | HR 75 | Temp 98.1°F | Ht 66.0 in | Wt 203.4 lb

## 2017-03-07 DIAGNOSIS — L659 Nonscarring hair loss, unspecified: Secondary | ICD-10-CM | POA: Diagnosis not present

## 2017-03-07 DIAGNOSIS — E038 Other specified hypothyroidism: Secondary | ICD-10-CM

## 2017-03-07 DIAGNOSIS — E538 Deficiency of other specified B group vitamins: Secondary | ICD-10-CM

## 2017-03-07 LAB — TSH: TSH: 2.88 u[IU]/mL (ref 0.35–4.50)

## 2017-03-07 LAB — T3, FREE: T3, Free: 2.9 pg/mL (ref 2.3–4.2)

## 2017-03-07 LAB — T4, FREE: Free T4: 0.74 ng/dL (ref 0.60–1.60)

## 2017-03-07 NOTE — Progress Notes (Signed)
Pre visit review using our clinic review tool, if applicable. No additional management support is needed unless otherwise documented below in the visit note. 

## 2017-03-07 NOTE — Telephone Encounter (Signed)
Spoke with Areli and advised I would fax over notes showing medical necessity for CPAP. I faxed last OV note from Dr. Halford Chessman. Nothing further is needed.

## 2017-03-07 NOTE — Progress Notes (Signed)
Patient ID: Jacqueline Jennings, female   DOB: May 20, 1950, 66 y.o.   MRN: [id number]   HPI  Jacqueline Jennings is a 66 y.o.-year-old female, returning for follow-up for hypothyroidism and B12 deficiency. Last visit 1 year ago.  She lost her husband in 07/2016 b/c Esophageal cancer. She is undergoing grief counseling.  She describes more hair loss.  Reviewed and addended history: Pt. has been dx with hypothyroidism in 2007-2008; started on Synthroid >> not feeling better >> was on Armour >> now Naturethyroid 65 mg.  Her TSH was overly suppressed in the past, as shown below. She was initially on a high dose of Armour: 120 mg, which we subsequently decreased to 90 mg, and then further to 60 mg >> NatureThroid 65.  Labs normalized and remained in good control afterwards.  She takes the NT: - in am - fasting - at least 30 min from b'fast - no Ca, Fe, MVI, PPIs - not on Biotin  Revieweing her TFTs: Lab Results  Component Value Date   TSH 1.60 03/08/2016   TSH 1.87 03/09/2015   TSH 2.39 09/08/2014   TSH 1.67 05/24/2014   TSH 1.62 03/01/2014   TSH 0.03 (L) 01/12/2014   TSH 0.05 (L) 11/23/2013   TSH 0.05 (L) 02/01/2013   TSH 0.10 (L) 05/26/2012   TSH 0.04 (L) 02/20/2012   FREET4 0.62 03/08/2016   FREET4 0.67 03/09/2015   FREET4 0.69 09/08/2014   FREET4 0.79 05/24/2014   FREET4 0.73 03/01/2014   FREET4 0.77 01/12/2014   FREET4 1.02 11/23/2013   FREET4 0.89 05/26/2012   FREET4 0.92 02/20/2012   FREET4 0.87 04/18/2011    Pt denies: - feeling nodules in neck - hoarseness - dysphagia - choking - SOB with lying down  We also diagnosed B12 deficiency in the past.  She is currently on 5000 mcg of methylcobalamin daily and her most recent vitamin B12 was not low.  Reviewed her B12 levels: Lab Results  Component Value Date   MIWOEHOZ22 482 (H) 02/14/2017   VITAMINB12 759 06/07/2016   VITAMINB12 298 02/22/2016   VITAMINB12 337 03/09/2015   VITAMINB12 280 12/14/2014   VITAMINB12 210 (L) 09/14/2014   She also has a history of BrCa 2006 >> had RxTx to R Breast.    ROS: Constitutional: no weight gain/no weight loss, no fatigue, no subjective hyperthermia, no subjective hypothermia Eyes: no blurry vision, no xerophthalmia ENT: no sore throat, + see HPI Cardiovascular: no CP/no SOB/no palpitations/no leg swelling Respiratory: no cough/no SOB/no wheezing Gastrointestinal: no N/no V/no D/no C/no acid reflux Musculoskeletal: no muscle aches/no joint aches Skin: no rashes, + hair loss Neurological: no tremors/no numbness/no tingling/no dizziness  I reviewed pt's medications, allergies, PMH, social hx, family hx, and changes were documented in the history of present illness. Otherwise, unchanged from my initial visit note.'  PE: BP 120/72   Pulse 75   Temp 98.1 F (36.7 C) (Oral)   Ht _0  (1.676 m)   Wt 203 lb 6.4 oz (92.3 kg)   SpO2 95%   BMI 32.83 kg/m  Body mass index is 32.83 kg/m. Wt Readings from Last 3 Encounters:  03/07/17 203 lb 6.4 oz (92.3 kg)  02/14/17 203 lb 9.6 oz (92.4 kg)  01/08/17 194 lb 9.6 oz (88.3 kg)   Constitutional: overweight, in NAD Eyes: PERRLA, EOMI, no exophthalmos ENT: moist mucous membranes, no thyromegaly, no cervical lymphadenopathy Cardiovascular: RRR, No MRG Respiratory: CTA B Gastrointestinal: abdomen soft, NT, ND, BS+ Musculoskeletal: no deformities,  strength intact in all 4 Skin: moist, warm, no rashes Neurological: no tremor with outstretched hands, DTR normal in all 4  ASSESSMENT: 1. Hypothyroidism - on desiccated thyroid extract  2. B12 def  3. Hair loss  PLAN:  1. Patient with long-standing hypothyroidism, on NatureThroid 65 mg daily. - latest thyroid labs reviewed with pt >> normal  - pt feels good on this dose,   Without complaints. - we discussed about taking the thyroid hormone every day, with water, >30 minutes before breakfast, separated by >4 hours from acid reflux medications, calcium,  iron, multivitamins. Pt. is taking it correctly - will check thyroid tests today: TSH and fT4 - If labs are abnormal, she will need to return for repeat TFTs in 1.5 months - OTW, RTC in 1 year  2. B12 def - Patient is on 5000 mcg daily of methylcobalamin and her most recent B12 level from 02/14/2017 was normal, and 982. - She is not complaining of fatigue, tingling/numbness, and she is not anemic - We will continue current dose of vitamin B12  3. Hair loss - worse - possible 2/2 stress with losing her husband - suggested to start Biotin in either the form of B complex or Hair skin and nails vitamins - discussed to stop Biotin before thyroid labs  Appointment on 03/07/2017  Component Date Value Ref Range Status  . TSH 03/07/2017 2.88  0.35 - 4.50 uIU/mL Final  . Free T4 03/07/2017 0.74  0.60 - 1.60 ng/dL Final   Comment: Specimens from patients who are undergoing biotin therapy and /or ingesting biotin supplements may contain high levels of biotin.  The higher biotin concentration in these specimens interferes with this Free T4 assay.  Specimens that contain high levels  of biotin may cause false high results for this Free T4 assay.  Please interpret results in light of the total clinical presentation of the patient.    . T3, Free 03/07/2017 2.9  2.3 - 4.2 pg/mL Final   The thyroid tests are normal.  Philemon Kingdom, MD PhD [facility] Endocrinology

## 2017-03-07 NOTE — Patient Instructions (Addendum)
Please continue B12 5000 mcg daily  If you start Biotin, do not forget to stop it 1 week before thyroid labs.  Please continue NatureThroid 65 mg daily.  Take the thyroid hormone every day, with water, at least 30 minutes before breakfast, separated by at least 4 hours from: - acid reflux medications - calcium - iron - multivitamins  Please return in 1 year.

## 2017-05-01 ENCOUNTER — Encounter: Payer: Self-pay | Admitting: Family Medicine

## 2017-07-03 DIAGNOSIS — B0089 Other herpesviral infection: Secondary | ICD-10-CM | POA: Diagnosis not present

## 2017-08-12 ENCOUNTER — Encounter: Payer: Self-pay | Admitting: Gastroenterology

## 2017-08-14 ENCOUNTER — Encounter: Payer: Self-pay | Admitting: Family Medicine

## 2017-08-14 ENCOUNTER — Ambulatory Visit (INDEPENDENT_AMBULATORY_CARE_PROVIDER_SITE_OTHER): Payer: Medicare Other | Admitting: Family Medicine

## 2017-08-14 VITALS — BP 116/78 | HR 68 | Temp 98.3°F | Ht 66.0 in | Wt 217.3 lb

## 2017-08-14 DIAGNOSIS — G4733 Obstructive sleep apnea (adult) (pediatric): Secondary | ICD-10-CM | POA: Diagnosis not present

## 2017-08-14 DIAGNOSIS — E038 Other specified hypothyroidism: Secondary | ICD-10-CM

## 2017-08-14 DIAGNOSIS — R739 Hyperglycemia, unspecified: Secondary | ICD-10-CM | POA: Diagnosis not present

## 2017-08-14 DIAGNOSIS — E785 Hyperlipidemia, unspecified: Secondary | ICD-10-CM

## 2017-08-14 NOTE — Progress Notes (Signed)
HPI:  Using dictation device. Unfortunately this device frequently misinterprets words/phrases.  AWV Due 02/16/18  Jacqueline Jennings is a pleasant 67 y.o. here for follow up. Chronic medical problems summarized below were reviewed for changes.  Reports doing okay.  Still struggling with bereavement, does not think she is depressed, but still gets sad often.  She has good support and lots of friends.  She is motivated to get out and do things.  She has a trip planned later this year.  She has not been eating healthy and does want to make some changes over the next few months.  She has been trying to stick to getting regular exercise 3 times per week at the gym.  She does not wish to check labs today.  She is scheduled for her Pap smear with her gynecologist.  Denies CP, SOB, DOE, treatment intolerance or new symptoms.   Bereavement: -husband passed 4/018 -did grief counseling  Obesity, mild hyperglycemia, mild hyperlipidemia: -healthy low sugar diet and exercise advised  Hypothyroidism: -sees Dr. Cruzita Lederer for management  OSA: -seeing pulmonary medicine, on CPAP  Hx breast ca: -sees gynecologist for women's health, Dr. Candie Mile  B12 def: -taking oral supplement  ROS: See pertinent positives and negatives per HPI.  Past Medical History:  Diagnosis Date  . Acute medial meniscal tear 10/02/2011  . Arthritis 09-27-11   Right knee/Cortisone injection 2'13  . Breast cancer ([place])    hx of with rediation, surgery and adjuvant theapy with tamoxifen  (year two)  . Colon polyps 2014  . Hyperlipidemia   . Hypothyroidism 09-27-11   Low function  . IBS (irritable bowel syndrome)   . Nephrolithiasis    hx of  . NEPHROLITHIASIS, HX OF 11/26/2006   Qualifier: Diagnosis of  By: Jimmye Norman, LPN, [place] WHETHER GEN/LOC [facility] SITE 02/28/2009   Qualifier: Diagnosis of  By: Arnoldo Morale MD, Balinda Quails Sleep apnea 09-27-11   Cpap used ,started 9'9371    Past Surgical History:   Procedure Laterality Date  . BREAST BIOPSY Right 1977  . BREAST LUMPECTOMY Right 2006   raditon 33 tx-right  . CATARACT EXTRACTION Bilateral 2008  . CYSTOSCOPY  1977  . KNEE ARTHROSCOPY  10/02/2011   Procedure: ARTHROSCOPY KNEE;  Surgeon: Gearlean Alf, MD;  Location: [facility];  Service: Orthopedics;  Laterality: Right;  with Debridement, right knee medial meniscus  . MYOMECTOMY  1990  . TONSILLECTOMY      Family History  Problem Relation Age of Onset  . Hypertension Mother   . Leukemia Mother   . Diabetes Father   . Uterine cancer Maternal Grandmother   . Colon cancer Neg Hx     SOCIAL HX: See above   Current Outpatient Medications:  .  Cholecalciferol (VITAMIN D-3 PO), Take 1,000 Units by mouth daily. , Disp: , Rfl:  .  Cyanocobalamin (VITAMIN B-12) 5000 MCG TBDP, Take 5,000 mg daily by mouth. , Disp: , Rfl:  .  fexofenadine (ALLEGRA) 180 MG tablet, Take 180 mg by mouth daily., Disp: , Rfl:  .  metroNIDAZOLE (METROGEL) 0.75 % gel, , Disp: , Rfl: 98 .  NATURE-THROID 65 MG tablet, TAKE 1 TABLET EVERY DAY, Disp: 90 tablet, Rfl: 2  EXAM:  Vitals:   08/14/17 1257  BP: 116/78  Pulse: 68  Temp: 98.3 F (36.8 C)    Body mass index is 35.07 kg/m.  GENERAL: vitals reviewed and listed above, alert, oriented, appears well hydrated and  in no acute distress  HEENT: atraumatic, conjunttiva clear, no obvious abnormalities on inspection of external nose and ears  NECK: no obvious masses on inspection  LUNGS: clear to auscultation bilaterally, no wheezes, rales or rhonchi, good air movement  CV: HRRR, no peripheral edema  MS: moves all extremities without noticeable abnormality  PSYCH: pleasant and cooperative, no obvious depression or anxiety  ASSESSMENT AND PLAN:  Discussed the following assessment and plan:  Hyperlipidemia, unspecified hyperlipidemia type  Hyperglycemia  Obstructive sleep apnea  Morbid obesity ([name])  Other specified  hypothyroidism  -Lifestyle recommendations -counseled and offered to help with the sadness, she may try a counselor through the [place] -She is trying on a mouthpiece for the sleep apnea and wishes to lose weight -She is in weight watchers and plans to start making some changes with her diet, recommended continue exercise as well -Sees endocrinology for her thyroid -Follow-up for physical and follow-up in October, we will do labs then per her preference   Patient Instructions  BEFORE YOU LEAVE: -follow up: AWV with susan and f/u with Dr. Maudie Mercury October or November after 02/16/18 - labs then  [place] in there. Consider calling the number provided for counseling and follow up here if needed.   We recommend the following healthy lifestyle for LIFE: 1) Small portions. But, make sure to get regular (at least 3 per day), healthy meals and small healthy snacks if needed.  2) Eat a healthy clean diet.   TRY TO EAT: -at least 5-7 servings of low sugar, colorful, and nutrient rich vegetables per day (not corn, potatoes or bananas.) -berries are the best choice if you wish to eat fruit (only eat small amounts if trying to reduce weight)  -lean meets (fish, white meat of chicken or [place]) -vegan proteins for some meals - beans or tofu, whole grains, nuts and seeds -Replace bad fats with good fats - good fats include: fish, nuts and seeds, canola oil, olive oil -small amounts of low fat or non fat dairy -small amounts of100 % whole grains - check the lables -drink plenty of water  AVOID: -SUGAR, sweets, anything with added sugar, corn syrup or sweeteners - must read labels as even foods advertised as "healthy" often are loaded with sugar -if you must have a sweetener, small amounts of stevia may be best -sweetened beverages and artificially sweetened beverages -simple starches (rice, bread, potatoes, pasta, chips, etc - small amounts of 100% whole grains are ok) -red meat, pork, butter -fried foods,  fast food, processed food, excessive dairy, eggs and coconut.  3)Get at least 150 minutes of sweaty aerobic exercise per week.  4)Reduce stress - consider counseling, meditation and relaxation to balance other aspects of your life.     Lucretia Kern, DO

## 2017-08-14 NOTE — Patient Instructions (Signed)
BEFORE YOU LEAVE: -follow up: AWV with susan and f/u with Dr. Maudie Mercury October or November after 02/16/18 - labs then  [place] in there. Consider calling the number provided for counseling and follow up here if needed.   We recommend the following healthy lifestyle for LIFE: 1) Small portions. But, make sure to get regular (at least 3 per day), healthy meals and small healthy snacks if needed.  2) Eat a healthy clean diet.   TRY TO EAT: -at least 5-7 servings of low sugar, colorful, and nutrient rich vegetables per day (not corn, potatoes or bananas.) -berries are the best choice if you wish to eat fruit (only eat small amounts if trying to reduce weight)  -lean meets (fish, white meat of chicken or [place]) -vegan proteins for some meals - beans or tofu, whole grains, nuts and seeds -Replace bad fats with good fats - good fats include: fish, nuts and seeds, canola oil, olive oil -small amounts of low fat or non fat dairy -small amounts of100 % whole grains - check the lables -drink plenty of water  AVOID: -SUGAR, sweets, anything with added sugar, corn syrup or sweeteners - must read labels as even foods advertised as "healthy" often are loaded with sugar -if you must have a sweetener, small amounts of stevia may be best -sweetened beverages and artificially sweetened beverages -simple starches (rice, bread, potatoes, pasta, chips, etc - small amounts of 100% whole grains are ok) -red meat, pork, butter -fried foods, fast food, processed food, excessive dairy, eggs and coconut.  3)Get at least 150 minutes of sweaty aerobic exercise per week.  4)Reduce stress - consider counseling, meditation and relaxation to balance other aspects of your life.

## 2017-08-18 ENCOUNTER — Encounter: Payer: Self-pay | Admitting: Gastroenterology

## 2017-09-03 DIAGNOSIS — Z1283 Encounter for screening for malignant neoplasm of skin: Secondary | ICD-10-CM | POA: Diagnosis not present

## 2017-09-03 DIAGNOSIS — D225 Melanocytic nevi of trunk: Secondary | ICD-10-CM | POA: Diagnosis not present

## 2017-09-03 DIAGNOSIS — L578 Other skin changes due to chronic exposure to nonionizing radiation: Secondary | ICD-10-CM | POA: Diagnosis not present

## 2017-09-03 DIAGNOSIS — T148XXA Other injury of unspecified body region, initial encounter: Secondary | ICD-10-CM | POA: Diagnosis not present

## 2017-09-03 DIAGNOSIS — L718 Other rosacea: Secondary | ICD-10-CM | POA: Diagnosis not present

## 2017-10-17 ENCOUNTER — Ambulatory Visit (AMBULATORY_SURGERY_CENTER): Payer: Self-pay

## 2017-10-17 VITALS — Ht 66.0 in | Wt 213.0 lb

## 2017-10-17 DIAGNOSIS — Z86010 Personal history of colonic polyps: Secondary | ICD-10-CM

## 2017-10-17 MED ORDER — NA SULFATE-K SULFATE-MG SULF 17.5-3.13-1.6 GM/177ML PO SOLN: 1.0000 | Freq: Once | ORAL | 0 refills | Status: AC

## 2017-10-17 NOTE — Progress Notes (Signed)
Per pt, no allergies to soy or egg products.Pt not taking any weight loss meds or using  O2 at home.  Pt refused emmi video. 

## 2017-10-29 DIAGNOSIS — H43813 Vitreous degeneration, bilateral: Secondary | ICD-10-CM | POA: Diagnosis not present

## 2017-10-29 DIAGNOSIS — H5213 Myopia, bilateral: Secondary | ICD-10-CM | POA: Diagnosis not present

## 2017-10-29 DIAGNOSIS — H40013 Open angle with borderline findings, low risk, bilateral: Secondary | ICD-10-CM | POA: Diagnosis not present

## 2017-10-29 DIAGNOSIS — H04123 Dry eye syndrome of bilateral lacrimal glands: Secondary | ICD-10-CM | POA: Diagnosis not present

## 2017-10-31 ENCOUNTER — Encounter: Payer: Self-pay | Admitting: Gastroenterology

## 2017-10-31 ENCOUNTER — Ambulatory Visit (AMBULATORY_SURGERY_CENTER): Payer: Medicare Other | Admitting: Gastroenterology

## 2017-10-31 VITALS — BP 135/73 | HR 64 | Temp 97.3°F | Resp 18 | Ht 66.0 in | Wt 217.0 lb

## 2017-10-31 DIAGNOSIS — Z86010 Personal history of colonic polyps: Secondary | ICD-10-CM | POA: Diagnosis not present

## 2017-10-31 DIAGNOSIS — K621 Rectal polyp: Secondary | ICD-10-CM | POA: Diagnosis not present

## 2017-10-31 DIAGNOSIS — D128 Benign neoplasm of rectum: Secondary | ICD-10-CM

## 2017-10-31 DIAGNOSIS — G4733 Obstructive sleep apnea (adult) (pediatric): Secondary | ICD-10-CM | POA: Diagnosis not present

## 2017-10-31 MED ORDER — SODIUM CHLORIDE 0.9 % IV SOLN: 500.0000 mL | INTRAVENOUS | Status: DC

## 2017-10-31 NOTE — Op Note (Signed)
[facility] Patient Name: Jacqueline Jennings Procedure Date: 10/31/2017 11:48 AM MRN: [id number] Endoscopist: Ladene Artist , MD Age: 67 Referring MD:  Date of Birth: [birth year] Gender: Female Account #: [id number] Procedure:                Colonoscopy Indications:              Surveillance: Personal history of adenomatous                            polyps on last colonoscopy 5 years ago Medicines:                Monitored Anesthesia Care Procedure:                Pre-Anesthesia Assessment:                           - Prior to the procedure, a History and Physical                            was performed, and patient medications and                            allergies were reviewed. The patient's tolerance of                            previous anesthesia was also reviewed. The risks                            and benefits of the procedure and the sedation                            options and risks were discussed with the patient.                            All questions were answered, and informed consent                            was obtained. Prior Anticoagulants: The patient has                            taken no previous anticoagulant or antiplatelet                            agents. ASA Grade Assessment: II - A patient with                            mild systemic disease. After reviewing the risks                            and benefits, the patient was deemed in                            satisfactory condition to undergo the procedure.  After obtaining informed consent, the colonoscope                            was passed under direct vision. Throughout the                            procedure, the patient's blood pressure, pulse, and                            oxygen saturations were monitored continuously. The                            Colonoscope was introduced through the anus and                            advanced to the the cecum,  identified by                            appendiceal orifice and ileocecal valve. The                            ileocecal valve, appendiceal orifice, and rectum                            were photographed. The quality of the bowel                            preparation was good. The colonoscopy was performed                            without difficulty. The patient tolerated the                            procedure well. Scope In: 11:50:42 AM Scope Out: 09:60:45 PM Scope Withdrawal Time: 0 hours 11 minutes 17 seconds  Total Procedure Duration: 0 hours 13 minutes 36 seconds  Findings:                 The perianal and digital rectal examinations were                            normal.                           Two sessile polyps were found in the rectum. The                            polyps were 5 to 6 mm in size. These polyps were                            removed with a cold snare. Resection and retrieval                            were complete.  Multiple medium-mouthed diverticula were found in                            the left colon. There was no evidence of                            diverticular bleeding.                           The exam was otherwise without abnormality on                            direct and retroflexion views. Complications:            No immediate complications. Estimated blood loss:                            None. Estimated Blood Loss:     Estimated blood loss: none. Impression:               - Two 5 to 6 mm polyps in the rectum, removed with                            a cold snare. Resected and retrieved.                           - Moderate diverticulosis in the left colon. Recommendation:           - Repeat colonoscopy in 5 years for surveillance.                           - Patient has a contact number available for                            emergencies. The signs and symptoms of potential                             delayed complications were discussed with the                            patient. Return to normal activities tomorrow.                            Written discharge instructions were provided to the                            patient.                           - High fiber diet.                           - Continue present medications.                           - Await pathology results. Ladene Artist, MD 10/31/2017 12:13:14 PM This report has  been signed electronically.

## 2017-10-31 NOTE — Progress Notes (Signed)
Pt's states no medical or surgical changes since previsit or office visit. 

## 2017-10-31 NOTE — Progress Notes (Signed)
Report to PACU, RN, vss, BBS= Clear.  

## 2017-10-31 NOTE — Progress Notes (Signed)
Called to room to assist during endoscopic procedure.  Patient ID and intended procedure confirmed with present staff. Received instructions for my participation in the procedure from the performing physician.  

## 2017-10-31 NOTE — Patient Instructions (Addendum)
YOU HAD AN ENDOSCOPIC PROCEDURE TODAY AT THE [facility]:   Refer to the procedure report that was given to you for any specific questions about what was found during the examination.  If the procedure report does not answer your questions, please call your gastroenterologist to clarify.  If you requested that your care partner not be given the details of your procedure findings, then the procedure report has been included in a sealed envelope for you to review at your convenience later.  YOU SHOULD EXPECT: Some feelings of bloating in the abdomen. Passage of more gas than usual.  Walking can help get rid of the air that was put into your GI tract during the procedure and reduce the bloating. If you had a lower endoscopy (such as a colonoscopy or flexible sigmoidoscopy) you may notice spotting of blood in your stool or on the toilet paper. If you underwent a bowel prep for your procedure, you may not have a normal bowel movement for a few days.  Please Note:  You might notice some irritation and congestion in your nose or some drainage.  This is from the oxygen used during your procedure.  There is no need for concern and it should clear up in a day or so.  SYMPTOMS TO REPORT IMMEDIATELY:   Following lower endoscopy (colonoscopy or flexible sigmoidoscopy):  Excessive amounts of blood in the stool  Significant tenderness or worsening of abdominal pains  Swelling of the abdomen that is new, acute  Fever of 100F or higher  For urgent or emergent issues, a gastroenterologist can be reached at any hour by calling [phone].   DIET:  We do recommend a small meal at first, but then you may proceed to your regular diet.  Drink plenty of fluids but you should avoid alcoholic beverages for 24 hours.  MEDICATIONS: Continue present medications.  Please see handouts given to you by your recovery nurse.  ACTIVITY:  You should plan to take it easy for the rest of today and you should NOT  DRIVE or use heavy machinery until tomorrow (because of the sedation medicines used during the test).    FOLLOW UP: Our staff will call the number listed on your records the next business day following your procedure to check on you and address any questions or concerns that you may have regarding the information given to you following your procedure. If we do not reach you, we will leave a message.  However, if you are feeling well and you are not experiencing any problems, there is no need to return our call.  We will assume that you have returned to your regular daily activities without incident.  If any biopsies were taken you will be contacted by phone or by letter within the next 1-3 weeks.  Please call us at [phone] if you have not heard about the biopsies in 3 weeks.   Thank you for allowing us to provide for your healthcare needs today.   SIGNATURES/CONFIDENTIALITY: You and/or your care partner have signed paperwork which will be entered into your electronic medical record.  These signatures attest to the fact that that the information above on your After Visit Summary has been reviewed and is understood.  Full responsibility of the confidentiality of this discharge information lies with you and/or your care-partner. 

## 2017-11-03 ENCOUNTER — Telehealth: Payer: Self-pay | Admitting: *Deleted

## 2017-11-03 NOTE — Telephone Encounter (Signed)
No answer for post procedure call back. Will attempt to call back later this afternoon. Sm 

## 2017-11-03 NOTE — Telephone Encounter (Signed)
  Follow up Call-  Call back number 10/31/2017  Post procedure Call Back phone  # [phone]  Permission to leave phone message Yes  Some recent data might be hidden     Patient questions:  Do you have a fever, pain , or abdominal swelling? No. Pain Score  0 *  Have you tolerated food without any problems? Yes.    Have you been able to return to your normal activities? Yes.    Do you have any questions about your discharge instructions: Diet   No. Medications  No. Follow up visit  No.  Do you have questions or concerns about your Care? No.  Actions: * If pain score is 4 or above: No action needed, pain <4.

## 2017-11-11 ENCOUNTER — Encounter: Payer: Self-pay | Admitting: Gastroenterology

## 2017-11-20 ENCOUNTER — Encounter: Payer: Self-pay | Admitting: Family Medicine

## 2017-11-20 DIAGNOSIS — Z853 Personal history of malignant neoplasm of breast: Secondary | ICD-10-CM | POA: Diagnosis not present

## 2017-11-20 DIAGNOSIS — Z1231 Encounter for screening mammogram for malignant neoplasm of breast: Secondary | ICD-10-CM | POA: Diagnosis not present

## 2017-11-20 LAB — HM MAMMOGRAPHY

## 2017-12-02 ENCOUNTER — Other Ambulatory Visit: Payer: Self-pay | Admitting: Internal Medicine

## 2017-12-16 DIAGNOSIS — M25562 Pain in left knee: Secondary | ICD-10-CM | POA: Insufficient documentation

## 2017-12-16 DIAGNOSIS — M1712 Unilateral primary osteoarthritis, left knee: Secondary | ICD-10-CM | POA: Diagnosis not present

## 2018-01-09 ENCOUNTER — Encounter: Payer: Self-pay | Admitting: Family Medicine

## 2018-01-22 DIAGNOSIS — M1712 Unilateral primary osteoarthritis, left knee: Secondary | ICD-10-CM | POA: Diagnosis not present

## 2018-02-11 ENCOUNTER — Ambulatory Visit (INDEPENDENT_AMBULATORY_CARE_PROVIDER_SITE_OTHER): Payer: Medicare Other | Admitting: Pulmonary Disease

## 2018-02-11 ENCOUNTER — Encounter: Payer: Self-pay | Admitting: Pulmonary Disease

## 2018-02-11 VITALS — BP 106/78 | HR 87 | Ht 65.0 in | Wt 210.0 lb

## 2018-02-11 DIAGNOSIS — Z9989 Dependence on other enabling machines and devices: Secondary | ICD-10-CM

## 2018-02-11 DIAGNOSIS — G4733 Obstructive sleep apnea (adult) (pediatric): Secondary | ICD-10-CM

## 2018-02-11 NOTE — Progress Notes (Signed)
[facility]  Chief Complaint  Patient presents with  . Follow-up    Pt has new mouth peice as recommended on last ov. Pt is doing well overall with cpap machine.    Constitutional:  BP 106/78 (BP Location: Left Arm, Cuff Size: Normal)   Pulse 87   Ht 5\' 5"  (1.651 m)   Wt 210 lb (95.3 kg)   SpO2 97%   BMI 34.95 kg/m   Past Medical History:  Breast cancer, Colon polyp, HLD, Hypothyroidism, IBS, Nephrolithiasis, OA  Brief Summary:  Jacqueline Jennings is a 67 y.o. female with obstructive sleep apnea.  She got oral appliance.  She uses this with travelling.  Otherwise uses CPAP.  No issues with mask fit.  Not having sinus congestion, sore throat, dry mouth, or aerophagia.  No issues with jaw pain or clicking.   Physical Exam:   Appearance - well kempt  ENMT - clear nasal mucosa, midline nasal septum, no oral exudates, no LAN, trachea midline Respiratory - normal chest wall, normal respiratory effort, no accessory muscle use, no wheeze/rales CV - s1s2 regular rate and rhythm, no murmurs, no peripheral edema, radial pulses symmetric GI - soft, non tender, no masses Lymph - no adenopathy noted in neck and axillary areas MSK - normal muscle strength and tone, normal gait Ext - no cyanosis, clubbing, or joint inflammation noted Skin - no rashes, lesions, or ulcers Neuro - normal strength Psych - normal mood and affect   Assessment/Plan:   Obstructive sleep apnea. - she is compliant with CPAP and reports benefit from therapy - continue auto CPAP - she uses oral appliance when she travels, and will f/u with her dentist   Patient Instructions  Follow up in 1 year    Chesley Mires, MD [place] Pager: [phone] 02/11/2018, 4:53 PM  Flow Sheet    Sleep tests:  PSG 02/01/09 >> AHI 11 Auto CPAP 11/14/17 to 02/11/18 >> used on 70 of 90 nights with average 8 hrs 12 min.  Average AHI 1.3 with median CPAP 10  and 95 th percentile CPAP 12 cm H2O.  Medications:   Allergies as of 02/11/2018      Reactions   Augmentin [amoxicillin-pot Clavulanate] Rash   Drug eruption   Latex Rash   Adhesives products-causes reddness, rash   Nabumetone    REACTION: Rash,Hives      Medication List        Accurate as of 02/11/18  4:53 PM. Always use your most recent med list.          fexofenadine 180 MG tablet Commonly known as:  ALLEGRA Take 180 mg by mouth daily.   ibuprofen 200 MG tablet Commonly known as:  ADVIL,MOTRIN Take 200 mg by mouth as needed.   meloxicam 15 MG tablet Commonly known as:  MOBIC Take 15 mg by mouth daily.   metroNIDAZOLE 0.75 % gel Commonly known as:  METROGEL 2 (two) times daily. Apply to face bid   NATURE-THROID 65 MG tablet Generic drug:  thyroid TAKE 1 TABLET BY MOUTH DAILY.   Vitamin B-12 5000 MCG Tbdp Take 2,500 mg by mouth 2 (two) times daily.   VITAMIN D-3 PO Take 1,000 Units by mouth daily.       Past Surgical History:  She  has a past surgical history that includes Tonsillectomy; Myomectomy (1990); Cystoscopy (1977); Cataract extraction (Bilateral, 2008); Breast biopsy (Right, 1977); Breast lumpectomy (Right, 2006); and Knee arthroscopy (10/02/2011).  Family  History:  Her family history includes Diabetes in her father; Hypertension in her mother; Leukemia in her mother; Uterine cancer in her maternal grandmother.  Social History:  She  reports that she has never smoked. She has never used smokeless tobacco. She reports that she drinks alcohol. She reports that she does not use drugs.

## 2018-02-11 NOTE — Patient Instructions (Signed)
Follow up in 1 year.

## 2018-02-12 NOTE — Progress Notes (Signed)
Subjective:   Jacqueline Jennings is a 67 y.o. female who presents for Medicare Annual (Initial ) preventive examination.  Reports health as good Spouse passed in 2018  Did like to travel  Going [place] BMI 34  No children Lives in long term home;  One level home   Diet BMI 32  Chol/ hdl 3; Breakfast- varies; oatmeal; eggs, avocado Lunch - varies as well; salads, chicken Did eat larger meal in the middle of the day Supper - likes to cook Tries to do one vegetarian meal - cooks vegetables   Exercise Does belong with the gym Struggles with right knee  elliptical, weight; bikes mix it up  4 days 60 minutes  Takes walks   Health Maintenance Due  Topic Date Due  . INFLUENZA VACCINE  11/20/2017  . PNA vac Low Risk Adult (2 of 2 - PPSV23) 02/14/2018   Colonoscopy 10/2017 Mammogram 11/2017 Bone density 01/2017 -0.7 (normal)   sees GYN for breast and dexa fup  Dr. [facility] and follows Bone density and mammogram    also Dr. Cruzita Lederer is following dexa   Dental work regularly     Cardiac Risk Factors include: advanced age (>25men, >79 women);family history of premature cardiovascular disease;obesity (BMI >30kg/m2)      Objective:     Vitals: BP (!) 142/92   Pulse 71   Ht 5\' 6"  (1.676 m)   Wt 212 lb (96.2 kg)   SpO2 96%   BMI 34.22 kg/m   Body mass index is 34.22 kg/m.  Advanced Directives 02/17/2018 02/03/2015 09/27/2011  Does Patient Have a Medical Advance Directive? Yes Yes Patient does not have advance directive  Type of Advance Directive - [place] -  Pre-existing out of facility DNR order (yellow form or pink MOST form) - - No    Tobacco Social History   Tobacco Use  Smoking Status Never Smoker  Smokeless Tobacco Never Used     Counseling given: Yes   Clinical Intake:     Past Medical History:  Diagnosis Date  . Acute medial meniscal tear 10/02/2011   right  . Allergy    seasonal  .  Arthritis 09-27-11   Right knee/Cortisone injection 2'13  . Breast cancer ([facility]) 2006   hx of with rediation, surgery and adjuvant theapy with tamoxifen  (year two)  . Colon polyps 2014  . Hyperlipidemia   . Hypothyroidism 09-27-11   Low function  . IBS (irritable bowel syndrome)   . Nephrolithiasis    hx of  . NEPHROLITHIASIS, HX OF 11/26/2006   Qualifier: Diagnosis of  By: Jimmye Norman, LPN, [place] WHETHER GEN/LOC [facility] SITE 02/28/2009   Qualifier: Diagnosis of  By: Arnoldo Morale MD, Balinda Qu[facility] Sleep apnea 09-27-11   Cpap used ,started 6'7619   Past Surgical History:  Procedure Laterality Date  . BREAST BIOPSY Right 1977  . BREAST LUMPECTOMY Right 2006   raditon 33 tx-right  . CATARACT EXTRACTION Bilateral 2008  . CYSTOSCOPY  1977  . KNEE ARTHROSCOPY  10/02/2011   Procedure: ARTHROSCOPY KNEE;  Surgeon: Gearlean Alf, MD;  Location: [facility];  Service: Orthopedics;  Laterality: Right;  with Debridement, right knee medial meniscus  . MYOMECTOMY  1990  . TONSILLECTOMY     Family History  Problem Relation Age of Onset  . Hypertension Mother   . Leukemia Mother   . Diabetes Father   . Uterine cancer Maternal  Grandmother   . Colon cancer Neg Hx    Social History   Socioeconomic History  . Marital status: Married    Spouse name: Not on file  . Number of children: 0  . Years of education: Not on file  . Highest education level: Not on file  Occupational History  . Not on file  Social Needs  . Financial resource strain: Not on file  . Food insecurity:    Worry: Not on file    Inability: Not on file  . Transportation needs:    Medical: Not on file    Non-medical: Not on file  Tobacco Use  . Smoking status: Never Smoker  . Smokeless tobacco: Never Used  Substance and Sexual Activity  . Alcohol use: Yes    Alcohol/week: 0.0 standard drinks    Comment: rare occ  . Drug use: No  . Sexual activity: Yes  Lifestyle  . Physical activity:    Days per week: Not  on file    Minutes per session: Not on file  . Stress: Not on file  Relationships  . Social connections:    Talks on phone: Not on file    Gets together: Not on file    Attends religious service: Not on file    Active member of club or organization: Not on file    Attends meetings of clubs or organizations: Not on file    Relationship status: Not on file  Other Topics Concern  . Not on file  Social History Narrative   Work or School: retired Education officer, museum, travels a lot      Home Situation: lives with husband      Spiritual Beliefs: Christian      Lifestyle: no regular exercise, healthy diet             Outpatient Encounter Medications as of 02/17/2018  Medication Sig  . Cholecalciferol (VITAMIN D-3 PO) Take 1,000 Units by mouth daily.   . Cyanocobalamin (VITAMIN B-12) 5000 MCG TBDP Take 2,500 mg by mouth 2 (two) times daily.   . fexofenadine (ALLEGRA) 180 MG tablet Take 180 mg by mouth daily.  Marland Kitchen ibuprofen (ADVIL,MOTRIN) 200 MG tablet Take 200 mg by mouth as needed.  . meloxicam (MOBIC) 15 MG tablet Take 15 mg by mouth daily.  . metroNIDAZOLE (METROGEL) 0.75 % gel 2 (two) times daily. Apply to face bid  . NATURE-THROID 65 MG tablet TAKE 1 TABLET BY MOUTH DAILY.   Facility-Administered Encounter Medications as of 02/17/2018  Medication  . 0.9 %  sodium chloride infusion    Activities of Daily Living In your present state of health, do you have any difficulty performing the following activities: 02/17/2018  Hearing? N  Vision? N  Difficulty concentrating or making decisions? N  Walking or climbing stairs? N  Dressing or bathing? N  Doing errands, shopping? N  Preparing Food and eating ? N  Using the Toilet? N  In the past six months, have you accidently leaked urine? N  Do you have problems with loss of bowel control? N  Managing your Medications? N  Managing your Finances? N  Housekeeping or managing your Housekeeping? N  Some recent data might be hidden     Patient Care Team: Lucretia Kern, DO as PCP - General (Family Medicine) Philemon Kingdom, MD as Consulting Physician (Internal Medicine) Dian Queen, MD as Consulting Physician (Obstetrics and Gynecology) Levy Sjogren, MD as Referring Physician (Dermatology) Marygrace Drought, MD as Consulting Physician (Ophthalmology)  Chesley Mires, MD as Consulting Physician (Pulmonary Disease)    Assessment:   This is a routine wellness examination for Danese.  Exercise Activities and Dietary recommendations Current Exercise Habits: Home exercise routine, Type of exercise: strength training/weights;walking, Time (Minutes): 60, Frequency (Times/Week): 4, Weekly Exercise (Minutes/Week): 240, Intensity: Moderate  Goals    . Weight (lb) < 200 lb (90.7 kg)     In Weight Watchers - currently        Fall Risk Fall Risk  02/17/2018 02/03/2015  Falls in the past year? No No     Depression Screen PHQ 2/9 Scores 02/17/2018 02/03/2015  PHQ - 2 Score 0 0     Cognitive Function     Ad8 score reviewed for issues:  Issues making decisions:  Less interest in hobbies / activities:  Repeats questions, stories (family complaining):  Trouble using ordinary gadgets (microwave, computer, phone):  Forgets the month or year:   Mismanaging finances:   Remembering appts:  Daily problems with thinking and/or memory: Ad8 score is=0        Immunization History  Administered Date(s) Administered  . Influenza Split 01/14/2011, 02/20/2012  . Influenza Whole 01/21/2007, 01/18/2008, 01/17/2009  . Influenza, High Dose Seasonal PF 02/14/2017  . Influenza,inj,Quad PF,6+ Mos 02/08/2013, 02/17/2014, 12/14/2014, 12/19/2015  . Pneumococcal Conjugate-13 02/14/2017  . Td 04/22/2005  . Tdap 02/22/2016  . Zoster 02/20/2012     Screening Tests Health Maintenance  Topic Date Due  . INFLUENZA VACCINE  11/20/2017  . PNA vac Low Risk Adult (2 of 2 - PPSV23) 02/14/2018  . MAMMOGRAM   11/21/2018  . COLONOSCOPY  11/01/2022  . TETANUS/TDAP  02/21/2026  . DEXA SCAN  Completed  . Hepatitis C Screening  Completed       Plan:      PCP Notes   Health Maintenance Colonoscopy 10/2017 Mammogram 11/2017 Bone density 01/2017 -0.7 (normal)   sees GYN for breast and dexa fup  Dr. [facility] and follows Bone density and mammogram    also Dr. Cruzita Lederer is following dexa   Dental work regularly    Abnormal Screens  BP elevated with Melaxicam   Referrals  none  Patient concerns; To discuss PSV 23 and flu  (prefers Mechele Claude give her vaccines) Will also discuss shingrix  Going to [place] the 1st of Nov  Nurse Concerns; As noted  Next PCP apt today   I have personally reviewed and noted the following in the patient's chart:   . Medical and social history . Use of alcohol, tobacco or illicit drugs  . Current medications and supplements . Functional ability and status . Nutritional status . Physical activity . Advanced directives . List of other physicians . Hospitalizations, surgeries, and ER visits in previous 12 months . Vitals . Screenings to include cognitive, depression, and falls . Referrals and appointments  In addition, I have reviewed and discussed with patient certain preventive protocols, quality metrics, and best practice recommendations. A written personalized care plan for preventive services as well as general preventive health recommendations were provided to patient.     Wynetta Fines, RN  02/17/2018

## 2018-02-16 NOTE — Progress Notes (Signed)
HPI:  Using dictation device. Unfortunately this device frequently misinterprets words/phrases.  Jacqueline Jennings is a pleasant 67 y.o. here for follow up. Chronic medical problems summarized below were reviewed for changes and stability and were updated as needed below. These issues and their treatment remain stable for the most part.  Doing well. No acute concerns. Continues to take b12 supplement. Sees Dr. Cruzita Lederer for this. Has visit with Dr. Cruzita Lederer soon. Going to [place] for tour. Had measles as a child. Recurrent UTIs and knows when getting one - requests abx rx for travel. No symptoms currently. Denies CP, SOB, DOE, treatment intolerance or new symptoms. Due for labs b12, hgba1c, lipids, ? Tsh, flu vaccine, pneumococal AWV 1/19 (today) with Epifania Gore  Bereavement: -husband passed 4/018 -did grief counseling  Obesity, mild hyperglycemia, mild hyperlipidemia: -healthy low sugar diet and exercise advised  Hypothyroidism: -sees Dr. Cruzita Lederer for management  OSA: -seeing pulmonary medicine, on CPAP  Hx breast ca: -sees gynecologist for women's health, Dr. Candie Mile  B12 def: -taking oral supplement  ROS: See pertinent positives and negatives per HPI.  Past Medical History:  Diagnosis Date  . Acute medial meniscal tear 10/02/2011   right  . Allergy    seasonal  . Arthritis 09-27-11   Right knee/Cortisone injection 2'13  . Breast cancer ([place]) 2006   hx of with rediation, surgery and adjuvant theapy with tamoxifen  (year two)  . Colon polyps 2014  . Hyperlipidemia   . Hypothyroidism 09-27-11   Low function  . IBS (irritable bowel syndrome)   . Nephrolithiasis    hx of  . NEPHROLITHIASIS, HX OF 11/26/2006   Qualifier: Diagnosis of  By: Jimmye Norman, LPN, [place] WHETHER GEN/LOC [facility] SITE 02/28/2009   Qualifier: Diagnosis of  By: Arnoldo Morale MD, Balinda Quails Sleep apnea 09-27-11   Cpap used ,started 7'1245    Past Surgical History:  Procedure  Laterality Date  . BREAST BIOPSY Right 1977  . BREAST LUMPECTOMY Right 2006   raditon 33 tx-right  . CATARACT EXTRACTION Bilateral 2008  . CYSTOSCOPY  1977  . KNEE ARTHROSCOPY  10/02/2011   Procedure: ARTHROSCOPY KNEE;  Surgeon: Gearlean Alf, MD;  Location: [facility];  Service: Orthopedics;  Laterality: Right;  with Debridement, right knee medial meniscus  . MYOMECTOMY  1990  . TONSILLECTOMY      Family History  Problem Relation Age of Onset  . Hypertension Mother   . Leukemia Mother   . Diabetes Father   . Uterine cancer Maternal Grandmother   . Colon cancer Neg Hx     SOCIAL HX: see hpi   Current Outpatient Medications:  .  Cholecalciferol (VITAMIN D-3 PO), Take 1,000 Units by mouth daily. , Disp: , Rfl:  .  Cyanocobalamin (VITAMIN B-12) 5000 MCG TBDP, Take 2,500 mg by mouth 2 (two) times daily. , Disp: , Rfl:  .  fexofenadine (ALLEGRA) 180 MG tablet, Take 180 mg by mouth daily., Disp: , Rfl:  .  ibuprofen (ADVIL,MOTRIN) 200 MG tablet, Take 200 mg by mouth as needed., Disp: , Rfl:  .  meloxicam (MOBIC) 15 MG tablet, Take 15 mg by mouth daily., Disp: , Rfl:  .  metroNIDAZOLE (METROGEL) 0.75 % gel, 2 (two) times daily. Apply to face bid, Disp: , Rfl: 98 .  NATURE-THROID 65 MG tablet, TAKE 1 TABLET BY MOUTH DAILY., Disp: 90 tablet, Rfl: 0 .  nitrofurantoin, macrocrystal-monohydrate, (MACROBID) 100 MG capsule, Take 1 capsule (100 mg  total) by mouth 2 (two) times daily., Disp: 14 capsule, Rfl: 0  Current Facility-Administered Medications:  .  0.9 %  sodium chloride infusion, 500 mL, Intravenous, Continuous, Ladene Artist, MD  EXAM:  Vitals:   02/17/18 1045  BP: 132/88  Pulse: 71  Temp: 98 F (36.7 C)    Body mass index is 34.22 kg/m.  GENERAL: vitals reviewed and listed above, alert, oriented, appears well hydrated and in no acute distress  HEENT: atraumatic, conjunttiva clear, no obvious abnormalities on inspection of external nose and ears  NECK: no obvious  masses on inspection  LUNGS: clear to auscultation bilaterally, no wheezes, rales or rhonchi, good air movement  CV: HRRR, no peripheral edema  MS: moves all extremities without noticeable abnormality  PSYCH: pleasant and cooperative, no obvious depression or anxiety  ASSESSMENT AND PLAN:  Discussed the following assessment and plan:  B12 deficiency - Plan: Vitamin B12  Hyperlipidemia, unspecified hyperlipidemia type - Plan: Lipid panel  Other specified hypothyroidism - Plan: TSH, T4, free, T3, free  Hyperglycemia - Plan: Hemoglobin A1c  Obesity (BMI 30-39.9)  Need for immunization against influenza - Plan: Flu vaccine HIGH DOSE PF (Fluzone High dose)  Need for prophylactic vaccination against Streptococcus pneumoniae (pneumococcus) - Plan: Pneumococcal polysaccharide vaccine 23-valent greater than or equal to 2yo subcutaneous/IM  -vaccines per orders -labs per orders -lifestyle recs -macrobid rx for if UTI on trip though recommended prompt medical car eif any severe symptoms or not responding to tx and discussed risks. -will forward thyroid and b12 labs to Dr. Cruzita Lederer per her request -follow up 2 weeks to recheck BP - took mobic this am and that was likely cause as she reports always low o/w. Seeing ortho for knee pain. Discussed other options for pain. -Patient advised to return or notify a doctor immediately if symptoms worsen or persist or new concerns arise.  Patient Instructions  BEFORE YOU LEAVE: -flu vaccine and pneumococcal 23 vaccines -labs -follow up: 2 weeks for elevated blood pressure  Do not take mobic or aleve prior to visit in 2 weeks.  We have ordered labs or studies at this visit. It can take up to 1-2 weeks for results and processing. IF results require follow up or explanation, we will call you with instructions. Clinically stable results will be released to your [facility]. If you have not heard from [place] or cannot find your results in [facility] in 2 weeks  please contact our office at [phone].  If you are not yet signed up for [facility], please consider signing up.           Lucretia Kern, DO

## 2018-02-17 ENCOUNTER — Ambulatory Visit (INDEPENDENT_AMBULATORY_CARE_PROVIDER_SITE_OTHER): Payer: Medicare Other | Admitting: Family Medicine

## 2018-02-17 ENCOUNTER — Ambulatory Visit (INDEPENDENT_AMBULATORY_CARE_PROVIDER_SITE_OTHER): Payer: Medicare Other

## 2018-02-17 ENCOUNTER — Encounter: Payer: Self-pay | Admitting: Family Medicine

## 2018-02-17 VITALS — BP 142/92 | HR 71 | Ht 66.0 in | Wt 212.0 lb

## 2018-02-17 VITALS — BP 132/88 | HR 71 | Temp 98.0°F | Ht 66.0 in | Wt 212.0 lb

## 2018-02-17 DIAGNOSIS — E669 Obesity, unspecified: Secondary | ICD-10-CM

## 2018-02-17 DIAGNOSIS — E785 Hyperlipidemia, unspecified: Secondary | ICD-10-CM | POA: Diagnosis not present

## 2018-02-17 DIAGNOSIS — R739 Hyperglycemia, unspecified: Secondary | ICD-10-CM | POA: Diagnosis not present

## 2018-02-17 DIAGNOSIS — E538 Deficiency of other specified B group vitamins: Secondary | ICD-10-CM

## 2018-02-17 DIAGNOSIS — Z0000 Encounter for general adult medical examination without abnormal findings: Secondary | ICD-10-CM

## 2018-02-17 DIAGNOSIS — Z23 Encounter for immunization: Secondary | ICD-10-CM

## 2018-02-17 DIAGNOSIS — E038 Other specified hypothyroidism: Secondary | ICD-10-CM

## 2018-02-17 LAB — LIPID PANEL
CHOL/HDL RATIO: 3
CHOLESTEROL: 194 mg/dL (ref 0–200)
HDL: 56.7 mg/dL (ref >39.00)
LDL CALC: 110 mg/dL — AB (ref 0–99)
NonHDL: 137.1
Triglycerides: 134 mg/dL (ref 0.0–149.0)
VLDL: 26.8 mg/dL (ref 0.0–40.0)

## 2018-02-17 LAB — T4, FREE: Free T4: 0.79 ng/dL (ref 0.60–1.60)

## 2018-02-17 LAB — T3, FREE: T3, Free: 3.5 pg/mL (ref 2.3–4.2)

## 2018-02-17 LAB — HEMOGLOBIN A1C: Hgb A1c MFr Bld: 5.8 % (ref 4.6–6.5)

## 2018-02-17 LAB — VITAMIN B12: VITAMIN B 12: 1282 pg/mL — AB (ref 211–911)

## 2018-02-17 LAB — TSH: TSH: 2.48 u[IU]/mL (ref 0.35–4.50)

## 2018-02-17 MED ORDER — NITROFURANTOIN MONOHYD MACRO 100 MG PO CAPS: 100.0000 mg | ORAL_CAPSULE | Freq: Two times a day (BID) | ORAL | 0 refills | Status: DC

## 2018-02-17 NOTE — Patient Instructions (Signed)
BEFORE YOU LEAVE: -flu vaccine and pneumococcal 23 vaccines -labs -follow up: 2 weeks for elevated blood pressure  Do not take mobic or aleve prior to visit in 2 weeks.  We have ordered labs or studies at this visit. It can take up to 1-2 weeks for results and processing. IF results require follow up or explanation, we will call you with instructions. Clinically stable results will be released to your [facility]. If you have not heard from [place] or cannot find your results in [facility] in 2 weeks please contact our office at [phone].  If you are not yet signed up for [facility], please consider signing up.

## 2018-02-17 NOTE — Patient Instructions (Addendum)
Jacqueline Jennings , Thank you for taking time to come for your Medicare Wellness Visit. I appreciate your ongoing commitment to your health goals. Please review the following plan we discussed and let me know if I can assist you in the future.  Would like to discuss PSV 23 and Flu vaccine today and will discuss   Shingrix is a vaccine for the prevention of Shingles in Adults 50 and older.  If you are on Medicare, the shingrix is covered under your Part D plan, so you will take both of the vaccines in the series at your pharmacy. Please check with your benefits regarding applicable copays or out of pocket expenses.  The Shingrix is given in 2 vaccines approx 8 weeks apart. You must receive the 2nd dose prior to 6 months from receipt of the first. Please have the pharmacist print out you Immunization  dates for our office records    These are the goals we discussed: Goals    . Weight (lb) < 200 lb (90.7 kg)     In [employer] - currently        This is a list of the screening recommended for you and due dates:  Health Maintenance  Topic Date Due  . Flu Shot  11/20/2017  . Pneumonia vaccines (2 of 2 - PPSV23) 02/14/2018  . Mammogram  11/21/2018  . Colon Cancer Screening  11/01/2022  . Tetanus Vaccine  02/21/2026  . DEXA scan (bone density measurement)  Completed  .  Hepatitis C: One time screening is recommended by Center for Disease Control  (CDC) for  adults born from [birth year] through 1964.   Completed    Fall Prevention in the Home Falls can cause injuries. They can happen to people of all ages. There are many things you can do to make your home safe and to help prevent falls. What can I do on the outside of my home?  Regularly fix the edges of walkways and driveways and fix any cracks.  Remove anything that might make you trip as you walk through a door, such as a raised step or threshold.  Trim any bushes or trees on the path to your home.  Use bright outdoor lighting.  Clear  any walking paths of anything that might make someone trip, such as rocks or tools.  Regularly check to see if handrails are loose or broken. Make sure that both sides of any steps have handrails.  Any raised decks and porches should have guardrails on the edges.  Have any leaves, snow, or ice cleared regularly.  Use sand or salt on walking paths during winter.  Clean up any spills in your garage right away. This includes oil or grease spills. What can I do in the bathroom?  Use night lights.  Install grab bars by the toilet and in the tub and shower. Do not use towel bars as grab bars.  Use non-skid mats or decals in the tub or shower.  If you need to sit down in the shower, use a plastic, non-slip stool.  Keep the floor dry. Clean up any water that spills on the floor as soon as it happens.  Remove soap buildup in the tub or shower regularly.  Attach bath mats securely with double-sided non-slip rug tape.  Do not have throw rugs and other things on the floor that can make you trip. What can I do in the bedroom?  Use night lights.  Make sure that you have a  light by your bed that is easy to reach.  Do not use any sheets or blankets that are too big for your bed. They should not hang down onto the floor.  Have a firm chair that has side arms. You can use this for support while you get dressed.  Do not have throw rugs and other things on the floor that can make you trip. What can I do in the kitchen?  Clean up any spills right away.  Avoid walking on wet floors.  Keep items that you use a lot in easy-to-reach places.  If you need to reach something above you, use a strong step stool that has a grab bar.  Keep electrical cords out of the way.  Do not use floor polish or wax that makes floors slippery. If you must use wax, use non-skid floor wax.  Do not have throw rugs and other things on the floor that can make you trip. What can I do with my stairs?  Do not  leave any items on the stairs.  Make sure that there are handrails on both sides of the stairs and use them. Fix handrails that are broken or loose. Make sure that handrails are as long as the stairways.  Check any carpeting to make sure that it is firmly attached to the stairs. Fix any carpet that is loose or worn.  Avoid having throw rugs at the top or bottom of the stairs. If you do have throw rugs, attach them to the floor with carpet tape.  Make sure that you have a light switch at the top of the stairs and the bottom of the stairs. If you do not have them, ask someone to add them for you. What else can I do to help prevent falls?  Wear shoes that: ? Do not have high heels. ? Have rubber bottoms. ? Are comfortable and fit you well. ? Are closed at the toe. Do not wear sandals.  If you use a stepladder: ? Make sure that it is fully opened. Do not climb a closed stepladder. ? Make sure that both sides of the stepladder are locked into place. ? Ask someone to hold it for you, if possible.  Clearly mark and make sure that you can see: ? Any grab bars or handrails. ? First and last steps. ? Where the edge of each step is.  Use tools that help you move around (mobility aids) if they are needed. These include: ? Canes. ? Walkers. ? Scooters. ? Crutches.  Turn on the lights when you go into a dark area. Replace any light bulbs as soon as they burn out.  Set up your furniture so you have a clear path. Avoid moving your furniture around.  If any of your floors are uneven, fix them.  If there are any pets around you, be aware of where they are.  Review your medicines with your doctor. Some medicines can make you feel dizzy. This can increase your chance of falling. Ask your doctor what other things that you can do to help prevent falls. This information is not intended to replace advice given to you by your health care provider. Make sure you discuss any questions you have with  your health care provider. Document Released: 02/02/2009 Document Revised: 09/14/2015 Document Reviewed: 05/13/2014 Elsevier Interactive Patient Education  2018 [place] Maintenance, Female Adopting a healthy lifestyle and getting preventive care can go a long way to promote health and  wellness. Talk with your health care provider about what schedule of regular examinations is right for you. This is a good chance for you to check in with your provider about disease prevention and staying healthy. In between checkups, there are plenty of things you can do on your own. Experts have done a lot of research about which lifestyle changes and preventive measures are most likely to keep you healthy. Ask your health care provider for more information. Weight and diet Eat a healthy diet  Be sure to include plenty of vegetables, fruits, low-fat dairy products, and lean protein.  Do not eat a lot of foods high in solid fats, added sugars, or salt.  Get regular exercise. This is one of the most important things you can do for your health. ? Most adults should exercise for at least 150 minutes each week. The exercise should increase your heart rate and make you sweat (moderate-intensity exercise). ? Most adults should also do strengthening exercises at least twice a week. This is in addition to the moderate-intensity exercise.  Maintain a healthy weight  Body mass index (BMI) is a measurement that can be used to identify possible weight problems. It estimates body fat based on height and weight. Your health care provider can help determine your BMI and help you achieve or maintain a healthy weight.  For females 32 years of age and older: ? A BMI below 18.5 is considered underweight. ? A BMI of 18.5 to 24.9 is normal. ? A BMI of 25 to 29.9 is considered overweight. ? A BMI of 30 and above is considered obese.  Watch levels of cholesterol and blood lipids  You should start having your  blood tested for lipids and cholesterol at 66 years of age, then have this test every 5 years.  You may need to have your cholesterol levels checked more often if: ? Your lipid or cholesterol levels are high. ? You are older than 66 years of age. ? You are at high risk for heart disease.  Cancer screening Lung Cancer  Lung cancer screening is recommended for adults 60-48 years old who are at high risk for lung cancer because of a history of smoking.  A yearly low-dose CT scan of the lungs is recommended for people who: ? Currently smoke. ? Have quit within the past 15 years. ? Have at least a 30-pack-year history of smoking. A pack year is smoking an average of one pack of cigarettes a day for 1 year.  Yearly screening should continue until it has been 15 years since you quit.  Yearly screening should stop if you develop a health problem that would prevent you from having lung cancer treatment.  Breast Cancer  Practice breast self-awareness. This means understanding how your breasts normally appear and feel.  It also means doing regular breast self-exams. Let your health care provider know about any changes, no matter how small.  If you are in your 20s or 30s, you should have a clinical breast exam (CBE) by a health care provider every 1-3 years as part of a regular health exam.  If you are 27 or older, have a CBE every year. Also consider having a breast X-ray (mammogram) every year.  If you have a family history of breast cancer, talk to your health care provider about genetic screening.  If you are at high risk for breast cancer, talk to your health care provider about having an MRI and a mammogram every year.  Breast  cancer gene (BRCA) assessment is recommended for women who have family members with BRCA-related cancers. BRCA-related cancers include: ? Breast. ? Ovarian. ? Tubal. ? Peritoneal cancers.  Results of the assessment will determine the need for genetic  counseling and BRCA1 and BRCA2 testing.  Cervical Cancer Your health care provider may recommend that you be screened regularly for cancer of the pelvic organs (ovaries, uterus, and vagina). This screening involves a pelvic examination, including checking for microscopic changes to the surface of your cervix (Pap test). You may be encouraged to have this screening done every 3 years, beginning at age 12.  For women ages 71-65, health care providers may recommend pelvic exams and Pap testing every 3 years, or they may recommend the Pap and pelvic exam, combined with testing for human papilloma virus (HPV), every 5 years. Some types of HPV increase your risk of cervical cancer. Testing for HPV may also be done on women of any age with unclear Pap test results.  Other health care providers may not recommend any screening for nonpregnant women who are considered low risk for pelvic cancer and who do not have symptoms. Ask your health care provider if a screening pelvic exam is right for you.  If you have had past treatment for cervical cancer or a condition that could lead to cancer, you need Pap tests and screening for cancer for at least 20 years after your treatment. If Pap tests have been discontinued, your risk factors (such as having a new sexual partner) need to be reassessed to determine if screening should resume. Some women have medical problems that increase the chance of getting cervical cancer. In these cases, your health care provider may recommend more frequent screening and Pap tests.  Colorectal Cancer  This type of cancer can be detected and often prevented.  Routine colorectal cancer screening usually begins at 66 years of age and continues through 66 years of age.  Your health care provider may recommend screening at an earlier age if you have risk factors for colon cancer.  Your health care provider may also recommend using home test kits to check for hidden blood in the  stool.  A small camera at the end of a tube can be used to examine your colon directly (sigmoidoscopy or colonoscopy). This is done to check for the earliest forms of colorectal cancer.  Routine screening usually begins at age 28.  Direct examination of the colon should be repeated every 5-10 years through 66 years of age. However, you may need to be screened more often if early forms of precancerous polyps or small growths are found.  Skin Cancer  Check your skin from head to toe regularly.  Tell your health care provider about any new moles or changes in moles, especially if there is a change in a mole's shape or color.  Also tell your health care provider if you have a mole that is larger than the size of a pencil eraser.  Always use sunscreen. Apply sunscreen liberally and repeatedly throughout the day.  Protect yourself by wearing long sleeves, pants, a wide-brimmed hat, and sunglasses whenever you are outside.  Heart disease, diabetes, and high blood pressure  High blood pressure causes heart disease and increases the risk of stroke. High blood pressure is more likely to develop in: ? People who have blood pressure in the high end of the normal range (130-139/85-89 mm Hg). ? People who are overweight or obese. ? People who are African American.  If you are 22-65 years of age, have your blood pressure checked every 3-5 years. If you are 62 years of age or older, have your blood pressure checked every year. You should have your blood pressure measured twice-once when you are at a hospital or clinic, and once when you are not at a hospital or clinic. Record the average of the two measurements. To check your blood pressure when you are not at a hospital or clinic, you can use: ? An automated blood pressure machine at a pharmacy. ? A home blood pressure monitor.  If you are between 72 years and 81 years old, ask your health care provider if you should take aspirin to prevent  strokes.  Have regular diabetes screenings. This involves taking a blood sample to check your fasting blood sugar level. ? If you are at a normal weight and have a low risk for diabetes, have this test once every three years after 66 years of age. ? If you are overweight and have a high risk for diabetes, consider being tested at a younger age or more often. Preventing infection Hepatitis B  If you have a higher risk for hepatitis B, you should be screened for this virus. You are considered at high risk for hepatitis B if: ? You were born in a country where hepatitis B is common. Ask your health care provider which countries are considered high risk. ? Your parents were born in a high-risk country, and you have not been immunized against hepatitis B (hepatitis B vaccine). ? You have HIV or AIDS. ? You use needles to inject street drugs. ? You live with someone who has hepatitis B. ? You have had sex with someone who has hepatitis B. ? You get hemodialysis treatment. ? You take certain medicines for conditions, including cancer, organ transplantation, and autoimmune conditions.  Hepatitis C  Blood testing is recommended for: ? Everyone born from [birth year] through 1964. ? Anyone with known risk factors for hepatitis C.  Sexually transmitted infections (STIs)  You should be screened for sexually transmitted infections (STIs) including gonorrhea and chlamydia if: ? You are sexually active and are younger than 66 years of age. ? You are older than 66 years of age and your health care provider tells you that you are at risk for this type of infection. ? Your sexual activity has changed since you were last screened and you are at an increased risk for chlamydia or gonorrhea. Ask your health care provider if you are at risk.  If you do not have HIV, but are at risk, it may be recommended that you take a prescription medicine daily to prevent HIV infection. This is called pre-exposure prophylaxis  (PrEP). You are considered at risk if: ? You are sexually active and do not regularly use condoms or know the HIV status of your partner(s). ? You take drugs by injection. ? You are sexually active with a partner who has HIV.  Talk with your health care provider about whether you are at high risk of being infected with HIV. If you choose to begin PrEP, you should first be tested for HIV. You should then be tested every 3 months for as long as you are taking PrEP. Pregnancy  If you are premenopausal and you may become pregnant, ask your health care provider about preconception counseling.  If you may become pregnant, take 400 to 800 micrograms (mcg) of folic acid every day.  If you want to prevent pregnancy, talk  to your health care provider about birth control (contraception). Osteoporosis and menopause  Osteoporosis is a disease in which the bones lose minerals and strength with aging. This can result in serious bone fractures. Your risk for osteoporosis can be identified using a bone density scan.  If you are 66 years of age or older, or if you are at risk for osteoporosis and fractures, ask your health care provider if you should be screened.  Ask your health care provider whether you should take a calcium or vitamin D supplement to lower your risk for osteoporosis.  Menopause may have certain physical symptoms and risks.  Hormone replacement therapy may reduce some of these symptoms and risks. Talk to your health care provider about whether hormone replacement therapy is right for you. Follow these instructions at home:  Schedule regular health, dental, and eye exams.  Stay current with your immunizations.  Do not use any tobacco products including cigarettes, chewing tobacco, or electronic cigarettes.  If you are pregnant, do not drink alcohol.  If you are breastfeeding, limit how much and how often you drink alcohol.  Limit alcohol intake to no more than 1 drink per day for  nonpregnant women. One drink equals 12 ounces of beer, 5 ounces of wine, or 1 ounces of hard liquor.  Do not use street drugs.  Do not share needles.  Ask your health care provider for help if you need support or information about quitting drugs.  Tell your health care provider if you often feel depressed.  Tell your health care provider if you have ever been abused or do not feel safe at home. This information is not intended to replace advice given to you by your health care provider. Make sure you discuss any questions you have with your health care provider. Document Released: 10/22/2010 Document Revised: 09/14/2015 Document Reviewed: 01/10/2015 Elsevier Interactive Patient Education  2018 [place] A mammogram is an X-ray of the breasts that is done to check for abnormal changes. This procedure can screen for and detect any changes that may suggest breast cancer. A mammogram can also identify other changes and variations in the breast, such as:  Inflammation of the breast tissue (mastitis).  An infected area that contains a collection of pus (abscess).  A fluid-filled sac (cyst).  Fibrocystic changes. This is when breast tissue becomes denser, which can make the tissue feel rope-like or uneven under the skin.  Tumors that are not cancerous (benign).  Tell a health care provider about:  Any allergies you have.  If you have breast implants.  If you have had previous breast disease, biopsy, or surgery.  If you are breastfeeding.  Any possibility that you could be pregnant, if this applies.  If you are younger than age 44.  If you have a family history of breast cancer. What are the risks? Generally, this is a safe procedure. However, problems may occur, including:  Exposure to radiation. Radiation levels are very low with this test.  The results being misinterpreted.  The need for further tests.  The inability of the mammogram to detect certain  cancers.  What happens before the procedure?  Schedule your test about 1-2 weeks after your menstrual period. This is usually when your breasts are the least tender.  If you have had a mammogram done at a different facility in the past, get the mammogram X-rays or have them sent to your current exam facility in order to compare them.  Wash  your breasts and under your arms the day of the test.  Do not wear deodorants, perfumes, lotions, or powders anywhere on your body on the day of the test.  Remove any jewelry from your neck.  Wear clothes that you can change into and out of easily. What happens during the procedure?  You will undress from the waist up and put on a gown.  You will stand in front of the X-ray machine.  Each breast will be placed between two plastic or glass plates. The plates will compress your breast for a few seconds. Try to stay as relaxed as possible during the procedure. This does not cause any harm to your breasts and any discomfort you feel will be very brief.  X-rays will be taken from different angles of each breast. The procedure may vary among health care providers and hospitals. What happens after the procedure?  The mammogram will be examined by a specialist (radiologist).  You may need to repeat certain parts of the test, depending on the quality of the images. This is commonly done if the radiologist needs a better view of the breast tissue.  Ask when your test results will be ready. Make sure you get your test results.  You may resume your normal activities. This information is not intended to replace advice given to you by your health care provider. Make sure you discuss any questions you have with your health care provider. Document Released: 04/05/2000 Document Revised: 09/11/2015 Document Reviewed: 06/17/2014 Elsevier Interactive Patient Education  [employer].

## 2018-02-19 NOTE — Progress Notes (Signed)
Hannah R Kim, DO  

## 2018-02-19 NOTE — Addendum Note (Signed)
Addended by: Agnes Lawrence on: 02/19/2018 10:07 AM   Modules accepted: Orders

## 2018-03-03 ENCOUNTER — Ambulatory Visit: Payer: Medicare Other | Admitting: Family Medicine

## 2018-03-03 ENCOUNTER — Encounter: Payer: Self-pay | Admitting: Family Medicine

## 2018-03-03 ENCOUNTER — Ambulatory Visit (INDEPENDENT_AMBULATORY_CARE_PROVIDER_SITE_OTHER): Payer: Medicare Other | Admitting: Family Medicine

## 2018-03-03 VITALS — BP 120/70 | HR 79 | Temp 98.0°F | Ht 66.0 in | Wt 212.0 lb

## 2018-03-03 DIAGNOSIS — R21 Rash and other nonspecific skin eruption: Secondary | ICD-10-CM

## 2018-03-03 DIAGNOSIS — R030 Elevated blood-pressure reading, without diagnosis of hypertension: Secondary | ICD-10-CM

## 2018-03-03 DIAGNOSIS — Z6833 Body mass index (BMI) 33.0-33.9, adult: Secondary | ICD-10-CM | POA: Diagnosis not present

## 2018-03-03 DIAGNOSIS — R52 Pain, unspecified: Secondary | ICD-10-CM | POA: Diagnosis not present

## 2018-03-03 DIAGNOSIS — Z779 Other contact with and (suspected) exposures hazardous to health: Secondary | ICD-10-CM | POA: Diagnosis not present

## 2018-03-03 NOTE — Patient Instructions (Signed)
BEFORE YOU LEAVE: -follow up: 6 months  Tylenol 500-1000mg  as need for pain. Max 3000mg  daily. Avoid alcohol with tylenol.   We recommend the following healthy lifestyle for LIFE: 1) Small portions. But, make sure to get regular (at least 3 per day), healthy meals and small healthy snacks if needed.  2) Eat a healthy clean diet.   TRY TO EAT: -at least 5-7 servings of low sugar, colorful, and nutrient rich vegetables per day (not corn, potatoes or bananas.) -berries are the best choice if you wish to eat fruit (only eat small amounts if trying to reduce weight)  -lean meets (fish, white meat of chicken or [place]) -vegan proteins for some meals - beans or tofu, whole grains, nuts and seeds -Replace bad fats with good fats - good fats include: fish, nuts and seeds, canola oil, olive oil -small amounts of low fat or non fat dairy -small amounts of100 % whole grains - check the lables -drink plenty of water  AVOID: -SUGAR, sweets, anything with added sugar, corn syrup or sweeteners - must read labels as even foods advertised as "healthy" often are loaded with sugar -if you must have a sweetener, small amounts of stevia may be best -sweetened beverages and artificially sweetened beverages -simple starches (rice, bread, potatoes, pasta, chips, etc - small amounts of 100% whole grains are ok) -red meat, pork, butter -fried foods, fast food, processed food, excessive dairy, eggs and coconut.  3)Get at least 150 minutes of sweaty aerobic exercise per week.  4)Reduce stress - consider counseling, meditation and relaxation to balance other aspects of your life.

## 2018-03-03 NOTE — Progress Notes (Signed)
HPI:  Using dictation device. Unfortunately this device frequently misinterprets words/phrases.  Reports is doing well.  She has stopped taking Mobic.  She is using Tylenol instead.  This seems to work well for her pain.  She had some redness and swelling near where she got the flu and the pneumonia shot at her last visit.  This resolved and is fine now.  No rash elsewhere, shortness of breath or hives.  ROS: See pertinent positives and negatives per HPI.  Past Medical History:  Diagnosis Date  . Acute medial meniscal tear 10/02/2011   right  . Allergy    seasonal  . Arthritis 09-27-11   Right knee/Cortisone injection 2'13  . Breast cancer ([place]) 2006   hx of with rediation, surgery and adjuvant theapy with tamoxifen  (year two)  . Colon polyps 2014  . Hyperlipidemia   . Hypothyroidism 09-27-11   Low function  . IBS (irritable bowel syndrome)   . Nephrolithiasis    hx of  . NEPHROLITHIASIS, HX OF 11/26/2006   Qualifier: Diagnosis of  By: Jimmye Norman, LPN, [place] WHETHER GEN/LOC [facility] SITE 02/28/2009   Qualifier: Diagnosis of  By: Arnoldo Morale MD, Balinda Quails Sleep apnea 09-27-11   Cpap used ,started 4'0347    Past Surgical History:  Procedure Laterality Date  . BREAST BIOPSY Right 1977  . BREAST LUMPECTOMY Right 2006   raditon 33 tx-right  . CATARACT EXTRACTION Bilateral 2008  . CYSTOSCOPY  1977  . KNEE ARTHROSCOPY  10/02/2011   Procedure: ARTHROSCOPY KNEE;  Surgeon: Gearlean Alf, MD;  Location: [facility];  Service: Orthopedics;  Laterality: Right;  with Debridement, right knee medial meniscus  . MYOMECTOMY  1990  . TONSILLECTOMY      Family History  Problem Relation Age of Onset  . Hypertension Mother   . Leukemia Mother   . Diabetes Father   . Uterine cancer Maternal Grandmother   . Colon cancer Neg Hx     SOCIAL HX: see hpi   Current Outpatient Medications:  .  acetaminophen (TYLENOL) 650 MG CR tablet, Take 650 mg by mouth as needed for pain.,  Disp: , Rfl:  .  Cholecalciferol (VITAMIN D-3 PO), Take 1,000 Units by mouth daily. , Disp: , Rfl:  .  Cyanocobalamin (VITAMIN B-12) 5000 MCG TBDP, Take 2,500 mg by mouth 2 (two) times daily. , Disp: , Rfl:  .  fexofenadine (ALLEGRA) 180 MG tablet, Take 180 mg by mouth daily., Disp: , Rfl:  .  metroNIDAZOLE (METROGEL) 0.75 % gel, 2 (two) times daily. Apply to face bid, Disp: , Rfl: 98 .  NATURE-THROID 65 MG tablet, TAKE 1 TABLET BY MOUTH DAILY., Disp: 90 tablet, Rfl: 0  Current Facility-Administered Medications:  .  0.9 %  sodium chloride infusion, 500 mL, Intravenous, Continuous, Ladene Artist, MD  EXAM:  Vitals:   03/03/18 1429  BP: 120/70  Pulse: 79  Temp: 98 F (36.7 C)    Body mass index is 34.22 kg/m.  GENERAL: vitals reviewed and listed above, alert, oriented, appears well hydrated and in no acute distress  HEENT: atraumatic, conjunttiva clear, no obvious abnormalities on inspection of external nose and ears  NECK: no obvious masses on inspection  LUNGS: clear to auscultation bilaterally, no wheezes, rales or rhonchi, good air movement  CV: HRRR, no peripheral edema  MS: moves all extremities without noticeable abnormality  PSYCH: pleasant and cooperative, no obvious depression or anxiety  ASSESSMENT AND PLAN:  Discussed the following assessment and plan:  Elevated blood pressure reading  Pain  Skin rash  -Lab blood pressure is better, advised avoiding NSAIDs if possible -Discussed safe dosing levels of Tylenol and interactions and that he goes to the liver -Seems like she had a mild local reaction regarding the pneumonia vaccine, she did have a flu vaccine that day as well, but has never had trouble with that and gets a yearly -advised that she notify clinical staff of receiving vaccines in the future to monitor closely -Patient advised to return or notify a doctor immediately if symptoms worsen or persist or new concerns arise.  Patient Instructions   BEFORE YOU LEAVE: -follow up: 6 months  Tylenol 500-1000mg  as need for pain. Max 3000mg  daily. Avoid alcohol with tylenol.   We recommend the following healthy lifestyle for LIFE: 1) Small portions. But, make sure to get regular (at least 3 per day), healthy meals and small healthy snacks if needed.  2) Eat a healthy clean diet.   TRY TO EAT: -at least 5-7 servings of low sugar, colorful, and nutrient rich vegetables per day (not corn, potatoes or bananas.) -berries are the best choice if you wish to eat fruit (only eat small amounts if trying to reduce weight)  -lean meets (fish, white meat of chicken or [place]) -vegan proteins for some meals - beans or tofu, whole grains, nuts and seeds -Replace bad fats with good fats - good fats include: fish, nuts and seeds, canola oil, olive oil -small amounts of low fat or non fat dairy -small amounts of100 % whole grains - check the lables -drink plenty of water  AVOID: -SUGAR, sweets, anything with added sugar, corn syrup or sweeteners - must read labels as even foods advertised as "healthy" often are loaded with sugar -if you must have a sweetener, small amounts of stevia may be best -sweetened beverages and artificially sweetened beverages -simple starches (rice, bread, potatoes, pasta, chips, etc - small amounts of 100% whole grains are ok) -red meat, pork, butter -fried foods, fast food, processed food, excessive dairy, eggs and coconut.  3)Get at least 150 minutes of sweaty aerobic exercise per week.  4)Reduce stress - consider counseling, meditation and relaxation to balance other aspects of your life.     Lucretia Kern, DO

## 2018-03-04 DIAGNOSIS — Z1283 Encounter for screening for malignant neoplasm of skin: Secondary | ICD-10-CM | POA: Diagnosis not present

## 2018-03-04 DIAGNOSIS — L821 Other seborrheic keratosis: Secondary | ICD-10-CM | POA: Diagnosis not present

## 2018-03-09 ENCOUNTER — Ambulatory Visit (INDEPENDENT_AMBULATORY_CARE_PROVIDER_SITE_OTHER): Payer: Medicare Other | Admitting: Internal Medicine

## 2018-03-09 ENCOUNTER — Encounter: Payer: Self-pay | Admitting: Internal Medicine

## 2018-03-09 VITALS — BP 122/80 | HR 73 | Ht 66.0 in | Wt 211.0 lb

## 2018-03-09 DIAGNOSIS — E039 Hypothyroidism, unspecified: Secondary | ICD-10-CM | POA: Diagnosis not present

## 2018-03-09 DIAGNOSIS — L659 Nonscarring hair loss, unspecified: Secondary | ICD-10-CM | POA: Diagnosis not present

## 2018-03-09 DIAGNOSIS — Z8639 Personal history of other endocrine, nutritional and metabolic disease: Secondary | ICD-10-CM

## 2018-03-09 MED ORDER — NATURE-THROID 65 MG PO TABS: 65.0000 mg | ORAL_TABLET | Freq: Every day | ORAL | 3 refills | Status: DC

## 2018-03-09 NOTE — Progress Notes (Signed)
Patient ID: Jacqueline Jennings, female   DOB: 1950/11/28, 67 y.o.   MRN: [id number]   HPI  Jacqueline Jennings is a 67 y.o.-year-old female, returning for follow-up for hypothyroidism and B12 deficiency. Last visit 1 year ago.  At last visit, she just lost her husband in 07/2016 due to esophageal cancer.  She was undergoing grief counseling.  She feels better now.  She continues to describe hair loss and especially her thinning.  Reviewed and addended history: Pt. has been dx with hypothyroidism in 2007-2008; started on Synthroid >> not feeling better >> was on Armour >> now Naturethyroid 65 mg.  Her TSH was overly suppressed in the past, as shown below. She was initially on a high dose of Armour: 120 mg, which we subsequently decreased to 90 mg, and then further to 60 mg >> currently on Nature-Throid.  Labs normalized and remained controlled afterwards..  Pt is on Nature-Throid 65 mg daily taken: - in am - fasting - at least 30 min from b'fast - no Ca, Fe, MVI, PPIs - not on Biotin  On vitamin D 1000 mcg daily.  Reviewed her TFTs: Lab Results  Component Value Date   TSH 2.48 02/17/2018   TSH 2.88 03/07/2017   TSH 1.60 03/08/2016   TSH 1.87 03/09/2015   TSH 2.39 09/08/2014   TSH 1.67 05/24/2014   TSH 1.62 03/01/2014   TSH 0.03 (L) 01/12/2014   TSH 0.05 (L) 11/23/2013   TSH 0.05 (L) 02/01/2013   FREET4 0.79 02/17/2018   FREET4 0.74 03/07/2017   FREET4 0.62 03/08/2016   FREET4 0.67 03/09/2015   FREET4 0.69 09/08/2014   FREET4 0.79 05/24/2014   FREET4 0.73 03/01/2014   FREET4 0.77 01/12/2014   FREET4 1.02 11/23/2013   FREET4 0.89 05/26/2012    Pt denies: - feeling nodules in neck - hoarseness - dysphagia - choking - SOB with lying down  We also diagnosed B12 deficiency in the past.  She continues on 5000 mcg of methylcobalamin daily, but was advised by PCP to decrease to 1000 mcg daily >> she did not do this yet.  Reviewed her B12 levels: Lab Results  Component  Value Date   VITAMINB12 1,282 (H) 02/17/2018   VITAMINB12 982 (H) 02/14/2017   VITAMINB12 759 06/07/2016   VITAMINB12 298 02/22/2016   VITAMINB12 337 03/09/2015   VITAMINB12 280 12/14/2014   VITAMINB12 210 (L) 09/14/2014   She also has a history of BrCa 2006 >> had radiation therapy to right breast.  She will go in a trip to [place] next month.  ROS: Constitutional: no weight gain/no weight loss, no fatigue, no subjective hyperthermia, no subjective hypothermia Eyes: no blurry vision, no xerophthalmia ENT: no sore throat, + see HPI Cardiovascular: no CP/no SOB/no palpitations/no leg swelling Respiratory: no cough/no SOB/no wheezing Gastrointestinal: no N/no V/no D/no C/no acid reflux Musculoskeletal: no muscle aches/no joint aches Skin: no rashes, + hair loss Neurological: no tremors/no numbness/no tingling/no dizziness  I reviewed pt's medications, allergies, PMH, social hx, family hx, and changes were documented in the history of present illness. Otherwise, unchanged from my initial visit note.  Past Medical History:  Diagnosis Date  . Acute medial meniscal tear 10/02/2011   right  . Allergy    seasonal  . Arthritis 09-27-11   Right knee/Cortisone injection 2'13  . Breast cancer ([place]) 2006   hx of with rediation, surgery and adjuvant theapy with tamoxifen  (year two)  . Colon polyps 2014  . Hyperlipidemia   .  Hypothyroidism 09-27-11   Low function  . IBS (irritable bowel syndrome)   . Nephrolithiasis    hx of  . NEPHROLITHIASIS, HX OF 11/26/2006   Qualifier: Diagnosis of  By: Jimmye Norman, LPN, [place] WHETHER GEN/LOC [facility] SITE 02/28/2009   Qualifier: Diagnosis of  By: Arnoldo Morale MD, Balinda Quails Sleep apnea 09-27-11   Cpap used ,started 6'7893   Past Surgical History:  Procedure Laterality Date  . BREAST BIOPSY Right 1977  . BREAST LUMPECTOMY Right 2006   raditon 33 tx-right  . CATARACT EXTRACTION Bilateral 2008  . CYSTOSCOPY  1977  . KNEE  ARTHROSCOPY  10/02/2011   Procedure: ARTHROSCOPY KNEE;  Surgeon: Gearlean Alf, MD;  Location: [facility];  Service: Orthopedics;  Laterality: Right;  with Debridement, right knee medial meniscus  . MYOMECTOMY  1990  . TONSILLECTOMY     Social History   Socioeconomic History  . Marital status: Married    Spouse name: Not on file  . Number of children: 0  . Years of education: Not on file  . Highest education level: Not on file  Occupational History  . Not on file  Social Needs  . Financial resource strain: Not on file  . Food insecurity:    Worry: Not on file    Inability: Not on file  . Transportation needs:    Medical: Not on file    Non-medical: Not on file  Tobacco Use  . Smoking status: Never Smoker  . Smokeless tobacco: Never Used  Substance and Sexual Activity  . Alcohol use: Yes    Alcohol/week: 0.0 standard drinks    Comment: rare occ  . Drug use: No  . Sexual activity: Yes  Lifestyle  . Physical activity:    Days per week: Not on file    Minutes per session: Not on file  . Stress: Not on file  Relationships  . Social connections:    Talks on phone: Not on file    Gets together: Not on file    Attends religious service: Not on file    Active member of club or organization: Not on file    Attends meetings of clubs or organizations: Not on file    Relationship status: Not on file  . Intimate partner violence:    Fear of current or ex partner: Not on file    Emotionally abused: Not on file    Physically abused: Not on file    Forced sexual activity: Not on file  Other Topics Concern  . Not on file  Social History Narrative   Work or School: retired Education officer, museum, travels a lot      Home Situation: lives with husband      Spiritual Beliefs: Christian      Lifestyle: no regular exercise, healthy diet            Current Outpatient Medications on File Prior to Visit  Medication Sig Dispense Refill  . acetaminophen (TYLENOL) 650 MG CR tablet Take 650 mg  by mouth as needed for pain.    . Cholecalciferol (VITAMIN D-3 PO) Take 1,000 Units by mouth daily.     . fexofenadine (ALLEGRA) 180 MG tablet Take 180 mg by mouth daily.    . metroNIDAZOLE (METROGEL) 0.75 % gel 2 (two) times daily. Apply to face bid  98  . NATURE-THROID 65 MG tablet TAKE 1 TABLET BY MOUTH DAILY. 90 tablet 0  . Cyanocobalamin (VITAMIN B-12) 5000  MCG TBDP Take 2,500 mg by mouth 2 (two) times daily.      Current Facility-Administered Medications on File Prior to Visit  Medication Dose Route Frequency Provider Last Rate Last Dose  . 0.9 %  sodium chloride infusion  500 mL Intravenous Continuous Ladene Artist, MD       Allergies  Allergen Reactions  . Augmentin [Amoxicillin-Pot Clavulanate] Rash    Drug eruption  . Latex Rash    Adhesives products-causes reddness, rash  . Nabumetone     REACTION: Rash,Hives   Family History  Problem Relation Age of Onset  . Hypertension Mother   . Leukemia Mother   . Diabetes Father   . Uterine cancer Maternal Grandmother   . Colon cancer Neg Hx     PE: BP 122/80   Pulse 73   Ht 5' 6"  (1.676 m) Comment: measured  Wt 211 lb (95.7 kg)   SpO2 97%   BMI 34.06 kg/m  Body mass index is 34.06 kg/m. Wt Readings from Last 3 Encounters:  03/03/18 212 lb (96.2 kg)  02/17/18 212 lb (96.2 kg)  02/17/18 212 lb (96.2 kg)   Constitutional: overweight, in NAD Eyes: PERRLA, EOMI, no exophthalmos ENT: moist mucous membranes, no thyromegaly, no cervical lymphadenopathy Cardiovascular: RRR, No MRG Respiratory: CTA B Gastrointestinal: abdomen soft, NT, ND, BS+ Musculoskeletal: no deformities, strength intact in all 4 Skin: moist, warm, no rashes Neurological: no tremor with outstretched hands, DTR normal in all 4  ASSESSMENT: 1. Hypothyroidism - on desiccated thyroid extract  2. B12 def  3. Hair loss  PLAN:  1. Patient with long-standing hypothyroidism, on Nature-Throid. - latest thyroid labs reviewed with pt >> normal less  than a month ago per checked by PCP - she continues on Nature-Throid 65 mg daily - pt feels good on this dose. - we discussed about taking the thyroid hormone every day, with water, >30 minutes before breakfast, separated by >4 hours from acid reflux medications, calcium, iron, multivitamins. Pt. is taking it correctly.  - I will see her back in a year  2. B12 def -Patient is on B12 5000 mcg daily -Denies fatigue, numbness or tingling, and she is not anemic -At last check, in 01/2018, her B12 level was high and she was advised to decrease the dose to thousand micrograms daily.  She is reticent to decrease it as low.  Since she has 5000 mcg tablets at home, I advised her to take this every other day.  3. Hair loss -Possibly due to stress of losing her husband, but she persists in having thinning hair -At last visit I suggested to start biotin in either the form of B complex or hair skin and nails vitamins.  She did not start this yet.  She would like to start now - I advised her to stop biotin before thyroid labs in case she starts  Philemon Kingdom, MD PhD [facility]

## 2018-03-09 NOTE — Patient Instructions (Addendum)
Please start Hair Skin and Nails with 1000 mcg Biotin.   Please remember to stop this at least 1 week before the next set of thyroid labs.  Please come back for a follow-up appointment in 1 year.

## 2018-03-17 DIAGNOSIS — M1712 Unilateral primary osteoarthritis, left knee: Secondary | ICD-10-CM | POA: Diagnosis not present

## 2018-04-22 HISTORY — PX: OTHER SURGICAL HISTORY: SHX169

## 2018-05-05 DIAGNOSIS — H40013 Open angle with borderline findings, low risk, bilateral: Secondary | ICD-10-CM | POA: Diagnosis not present

## 2018-05-09 DIAGNOSIS — M1712 Unilateral primary osteoarthritis, left knee: Secondary | ICD-10-CM | POA: Diagnosis not present

## 2018-05-22 DIAGNOSIS — M1712 Unilateral primary osteoarthritis, left knee: Secondary | ICD-10-CM | POA: Diagnosis not present

## 2018-05-23 HISTORY — PX: KNEE ARTHROSCOPY: SUR90

## 2018-05-25 ENCOUNTER — Other Ambulatory Visit: Payer: Medicare Other

## 2018-05-26 ENCOUNTER — Ambulatory Visit (INDEPENDENT_AMBULATORY_CARE_PROVIDER_SITE_OTHER): Payer: Medicare Other | Admitting: Family Medicine

## 2018-05-26 ENCOUNTER — Encounter: Payer: Self-pay | Admitting: Family Medicine

## 2018-05-26 VITALS — BP 120/64 | HR 82 | Temp 98.4°F | Wt 204.9 lb

## 2018-05-26 DIAGNOSIS — J069 Acute upper respiratory infection, unspecified: Secondary | ICD-10-CM | POA: Diagnosis not present

## 2018-05-26 DIAGNOSIS — B9789 Other viral agents as the cause of diseases classified elsewhere: Secondary | ICD-10-CM

## 2018-05-26 NOTE — Telephone Encounter (Signed)
I called the pt and informed her of the message below.  Patient complains of chest tightness and an appt was scheduled for today at 4:40pm with Dr Elease Hashimoto.  Message sent to Dr Elease Hashimoto as Juluis Rainier also.

## 2018-05-26 NOTE — Progress Notes (Signed)
Subjective:     Patient ID: Jacqueline Jennings, female   DOB: 12/21/1950, 68 y.o.   MRN: [id number]  HPI Patient is seen for acute respiratory symptoms with onset 2 days ago.  She has dry cough low-grade fever, body aches, and chills.  No nausea or vomiting.  Her major concern is that she is scheduled for left knee arthroscopic surgery next Tuesday.  Has history of obstructive sleep apnea, hypothyroidism, hyperlipidemia, and remote history of breast cancer.  No recent travels.  Non-smoker.  Past Medical History:  Diagnosis Date  . Acute medial meniscal tear 10/02/2011   right  . Allergy    seasonal  . Arthritis 09-27-11   Right knee/Cortisone injection 2'13  . Breast cancer ([place]) 2006   hx of with rediation, surgery and adjuvant theapy with tamoxifen  (year two)  . Colon polyps 2014  . Hyperlipidemia   . Hypothyroidism 09-27-11   Low function  . IBS (irritable bowel syndrome)   . Nephrolithiasis    hx of  . NEPHROLITHIASIS, HX OF 11/26/2006   Qualifier: Diagnosis of  By: Jimmye Norman, LPN, [place] WHETHER GEN/LOC [facility] SITE 02/28/2009   Qualifier: Diagnosis of  By: Arnoldo Morale MD, Balinda Quails Sleep apnea 09-27-11   Cpap used ,started 7'3419   Past Surgical History:  Procedure Laterality Date  . BREAST BIOPSY Right 1977  . BREAST LUMPECTOMY Right 2006   raditon 33 tx-right  . CATARACT EXTRACTION Bilateral 2008  . CYSTOSCOPY  1977  . KNEE ARTHROSCOPY  10/02/2011   Procedure: ARTHROSCOPY KNEE;  Surgeon: Gearlean Alf, MD;  Location: [facility];  Service: Orthopedics;  Laterality: Right;  with Debridement, right knee medial meniscus  . MYOMECTOMY  1990  . TONSILLECTOMY      reports that she has never smoked. She has never used smokeless tobacco. She reports current alcohol use. She reports that she does not use drugs. family history includes Diabetes in her father; Hypertension in her mother; Leukemia in her mother; Uterine cancer in her maternal grandmother. Allergies   Allergen Reactions  . Augmentin [Amoxicillin-Pot Clavulanate] Rash    Drug eruption  . Latex Rash    Adhesives products-causes reddness, rash  . Nabumetone     REACTION: Rash,Hives     Review of Systems  Constitutional: Positive for chills, fatigue and fever.  HENT: Positive for congestion.   Respiratory: Positive for cough.   Cardiovascular: Negative for chest pain.       Objective:   Physical Exam Constitutional:      Appearance: Normal appearance.  HENT:     Right Ear: Tympanic membrane normal.     Left Ear: Tympanic membrane normal.     Mouth/Throat:     Pharynx: Oropharynx is clear. No oropharyngeal exudate.  Neck:     Musculoskeletal: Neck supple.  Cardiovascular:     Rate and Rhythm: Normal rate and regular rhythm.  Pulmonary:     Effort: Pulmonary effort is normal. No respiratory distress.     Breath sounds: Normal breath sounds. No rales.  Lymphadenopathy:     Cervical: No cervical adenopathy.  Neurological:     Mental Status: She is alert.        Assessment:     Acute upper respiratory infection with cough.  No respiratory distress.    Plan:     -Check rapid influenza screen since she has less than 48 hours in the illness (Negative)  -Treat symptomatically with plenty of  fluids and rest   she will try some over-the-counter plain Mucinex  -Follow-up for any persistent or worsening symptoms  Eulas Post MD [facility]

## 2018-05-26 NOTE — Patient Instructions (Signed)
Sinusitis, Adult  Sinusitis is inflammation of your sinuses. Sinuses are hollow spaces in the bones around your face. Your sinuses are located:   Around your eyes.   In the middle of your forehead.   Behind your nose.   In your cheekbones.  Mucus normally drains out of your sinuses. When your nasal tissues become inflamed or swollen, mucus can become trapped or blocked. This allows bacteria, viruses, and fungi to grow, which leads to infection. Most infections of the sinuses are caused by a virus.  Sinusitis can develop quickly. It can last for up to 4 weeks (acute) or for more than 12 weeks (chronic). Sinusitis often develops after a cold.  What are the causes?  This condition is caused by anything that creates swelling in the sinuses or stops mucus from draining. This includes:   Allergies.   Asthma.   Infection from bacteria or viruses.   Deformities or blockages in your nose or sinuses.   Abnormal growths in the nose (nasal polyps).   Pollutants, such as chemicals or irritants in the air.   Infection from fungi (rare).  What increases the risk?  You are more likely to develop this condition if you:   Have a weak body defense system (immune system).   Do a lot of swimming or diving.   Overuse nasal sprays.   Smoke.  What are the signs or symptoms?  The main symptoms of this condition are pain and a feeling of pressure around the affected sinuses. Other symptoms include:   Stuffy nose or congestion.   Thick drainage from your nose.   Swelling and warmth over the affected sinuses.   Headache.   Upper toothache.   A cough that may get worse at night.   Extra mucus that collects in the throat or the back of the nose (postnasal drip).   Decreased sense of smell and taste.   Fatigue.   A fever.   Sore throat.   Bad breath.  How is this diagnosed?  This condition is diagnosed based on:   Your symptoms.   Your medical history.   A physical exam.   Tests to find out if your condition is  acute or chronic. This may include:  ? Checking your nose for nasal polyps.  ? Viewing your sinuses using a device that has a light (endoscope).  ? Testing for allergies or bacteria.  ? Imaging tests, such as an MRI or CT scan.  In rare cases, a bone biopsy may be done to rule out more serious types of fungal sinus disease.  How is this treated?  Treatment for sinusitis depends on the cause and whether your condition is chronic or acute.   If caused by a virus, your symptoms should go away on their own within 10 days. You may be given medicines to relieve symptoms. They include:  ? Medicines that shrink swollen nasal passages (topical intranasal decongestants).  ? Medicines that treat allergies (antihistamines).  ? A spray that eases inflammation of the nostrils (topical intranasal corticosteroids).  ? Rinses that help get rid of thick mucus in your nose (nasal saline washes).   If caused by bacteria, your health care provider may recommend waiting to see if your symptoms improve. Most bacterial infections will get better without antibiotic medicine. You may be given antibiotics if you have:  ? A severe infection.  ? A weak immune system.   If caused by narrow nasal passages or nasal polyps, you may need   to have surgery.  Follow these instructions at home:  Medicines   Take, use, or apply over-the-counter and prescription medicines only as told by your health care provider. These may include nasal sprays.   If you were prescribed an antibiotic medicine, take it as told by your health care provider. Do not stop taking the antibiotic even if you start to feel better.  Hydrate and humidify     Drink enough fluid to keep your urine pale yellow. Staying hydrated will help to thin your mucus.   Use a cool mist humidifier to keep the humidity level in your home above 50%.   Inhale steam for 10-15 minutes, 3-4 times a day, or as told by your health care provider. You can do this in the bathroom while a hot shower is  running.   Limit your exposure to cool or dry air.  Rest   Rest as much as possible.   Sleep with your head raised (elevated).   Make sure you get enough sleep each night.  General instructions     Apply a warm, moist washcloth to your face 3-4 times a day or as told by your health care provider. This will help with discomfort.   Wash your hands often with soap and water to reduce your exposure to germs. If soap and water are not available, use hand sanitizer.   Do not smoke. Avoid being around people who are smoking (secondhand smoke).   Keep all follow-up visits as told by your health care provider. This is important.  Contact a health care provider if:   You have a fever.   Your symptoms get worse.   Your symptoms do not improve within 10 days.  Get help right away if:   You have a severe headache.   You have persistent vomiting.   You have severe pain or swelling around your face or eyes.   You have vision problems.   You develop confusion.   Your neck is stiff.   You have trouble breathing.  Summary   Sinusitis is soreness and inflammation of your sinuses. Sinuses are hollow spaces in the bones around your face.   This condition is caused by nasal tissues that become inflamed or swollen. The swelling traps or blocks the flow of mucus. This allows bacteria, viruses, and fungi to grow, which leads to infection.   If you were prescribed an antibiotic medicine, take it as told by your health care provider. Do not stop taking the antibiotic even if you start to feel better.   Keep all follow-up visits as told by your health care provider. This is important.  This information is not intended to replace advice given to you by your health care provider. Make sure you discuss any questions you have with your health care provider.  Document Released: 04/08/2005 Document Revised: 09/08/2017 Document Reviewed: 09/08/2017  [employer]vier Interactive Patient Education  2019 [employer]

## 2018-05-28 ENCOUNTER — Ambulatory Visit (INDEPENDENT_AMBULATORY_CARE_PROVIDER_SITE_OTHER): Payer: Medicare Other | Admitting: Family Medicine

## 2018-05-28 ENCOUNTER — Encounter: Payer: Self-pay | Admitting: Family Medicine

## 2018-05-28 VITALS — BP 102/72 | HR 83 | Temp 98.4°F | Ht 66.0 in

## 2018-05-28 DIAGNOSIS — R350 Frequency of micturition: Secondary | ICD-10-CM | POA: Diagnosis not present

## 2018-05-28 LAB — POCT URINALYSIS DIPSTICK
Bilirubin, UA: NEGATIVE
Glucose, UA: NEGATIVE
Ketones, UA: NEGATIVE
Leukocytes, UA: NEGATIVE
NITRITE UA: NEGATIVE
PROTEIN UA: NEGATIVE
RBC UA: NEGATIVE
SPEC GRAV UA: 1.02 (ref 1.010–1.025)
Urobilinogen, UA: 0.2 E.U./dL
pH, UA: 5 (ref 5.0–8.0)

## 2018-05-28 NOTE — Progress Notes (Signed)
HPI:  Using dictation device. Unfortunately this device frequently misinterprets words/phrases.  Acute visit for Dysuria: -started today -symptoms include: burning with urination, urgency -now better -denies fevers, malaise, flank pain, hematuria, vag symptoms, NV -using cream from gyn for mild vaginitis - not sure of what cream  ROS: See pertinent positives and negatives per HPI.  Past Medical History:  Diagnosis Date  . Acute medial meniscal tear 10/02/2011   right  . Allergy    seasonal  . Arthritis 09-27-11   Right knee/Cortisone injection 2'13  . Breast cancer ([place]) 2006   hx of with rediation, surgery and adjuvant theapy with tamoxifen  (year two)  . Colon polyps 2014  . Hyperlipidemia   . Hypothyroidism 09-27-11   Low function  . IBS (irritable bowel syndrome)   . Nephrolithiasis    hx of  . NEPHROLITHIASIS, HX OF 11/26/2006   Qualifier: Diagnosis of  By: Jimmye Norman, LPN, [place] WHETHER GEN/LOC [facility] SITE 02/28/2009   Qualifier: Diagnosis of  By: Arnoldo Morale MD, Balinda Quails Sleep apnea 09-27-11   Cpap used ,started 3'1497    Past Surgical History:  Procedure Laterality Date  . BREAST BIOPSY Right 1977  . BREAST LUMPECTOMY Right 2006   raditon 33 tx-right  . CATARACT EXTRACTION Bilateral 2008  . CYSTOSCOPY  1977  . KNEE ARTHROSCOPY  10/02/2011   Procedure: ARTHROSCOPY KNEE;  Surgeon: Gearlean Alf, MD;  Location: [facility];  Service: Orthopedics;  Laterality: Right;  with Debridement, right knee medial meniscus  . MYOMECTOMY  1990  . TONSILLECTOMY      Family History  Problem Relation Age of Onset  . Hypertension Mother   . Leukemia Mother   . Diabetes Father   . Uterine cancer Maternal Grandmother   . Colon cancer Neg Hx     SOCIAL HX: see hpi   Current Outpatient Medications:  .  acetaminophen (TYLENOL) 650 MG CR tablet, Take 650 mg by mouth as needed for pain., Disp: , Rfl:  .  Cholecalciferol (VITAMIN D-3 PO), Take 1,000 Units by  mouth daily. , Disp: , Rfl:  .  Cyanocobalamin (VITAMIN B-12) 5000 MCG TBDP, Take 2,500 mg by mouth 2 (two) times daily. , Disp: , Rfl:  .  fexofenadine (ALLEGRA) 180 MG tablet, Take 180 mg by mouth daily., Disp: , Rfl:  .  metroNIDAZOLE (METROGEL) 0.75 % gel, 2 (two) times daily. Apply to face bid, Disp: , Rfl: 98 .  NATURE-THROID 65 MG tablet, Take 1 tablet (65 mg total) by mouth daily., Disp: 90 tablet, Rfl: 3  Current Facility-Administered Medications:  .  0.9 %  sodium chloride infusion, 500 mL, Intravenous, Continuous, Ladene Artist, MD  EXAM:  Vitals:   05/28/18 1055  BP: 102/72  Pulse: 83  Temp: 98.4 F (36.9 C)    Body mass index is 33.07 kg/m.  GENERAL: vitals reviewed and listed above, alert, oriented, appears well hydrated and in no acute distress  HEENT: atraumatic, conjunttiva clear, no obvious abnormalities on inspection of external nose and ears  NECK: no obvious masses on inspection  LUNGS: clear to auscultation bilaterally, no wheezes, rales or rhonchi, good air movement  CV: HRRR, no peripheral edema  ABD: no cva ttp  MS: moves all extremities without noticeable abnormality  PSYCH: pleasant and cooperative, no obvious depression or anxiety  ASSESSMENT AND PLAN:  Discussed the following assessment and plan:  Urinary frequency - Plan: POC Urinalysis Dipstick, Culture, Urine  -  udip ok, will get culture as she feels is similar to prior UTI -if neg advise gyn eval -Patient advised to return or notify a doctor immediately if symptoms worsen or persist or new concerns arise. Advised of ucc option over the weekend if any worsening  There are no Patient Instructions on file for this visit.  Lucretia Kern, DO

## 2018-05-29 ENCOUNTER — Telehealth: Payer: Self-pay | Admitting: *Deleted

## 2018-05-29 NOTE — Telephone Encounter (Signed)
Copied from [place] [id number]. Topic: General - Other >> May 29, 2018  8:08 AM Oneta Rack wrote:  Relation to pt: self  Call back number:  [phone] Pharmacy:  Reason for call:  Patient urine was negative yesterday and PCP advised to call and request another urine order if the following symotpms didn't improve: Urgency and burning please advise when orders have been placed

## 2018-05-29 NOTE — Telephone Encounter (Signed)
Message sent to Dr Ethlyn Gallery.

## 2018-05-29 NOTE — Telephone Encounter (Signed)
Maybe she is not aware that Dr. Maudie Mercury sent urine for culture already and this is still pending? I would just touch base with her and let her know that results for culture are not back yet, but we should have by Monday. Make sure that symptoms are not worsening and no new symptoms (like back pain, fever) that would need to be addressed sooner. Also recommend avoiding food/drink that will irritate bladder (like spicy, citrus).

## 2018-05-30 LAB — URINE CULTURE
MICRO NUMBER:: 161234
SPECIMEN QUALITY:: ADEQUATE

## 2018-05-31 ENCOUNTER — Other Ambulatory Visit: Payer: Self-pay

## 2018-05-31 ENCOUNTER — Ambulatory Visit (HOSPITAL_COMMUNITY)
Admission: EM | Admit: 2018-05-31 | Discharge: 2018-05-31 | Disposition: A | Discharge status: Home / Self Care | Payer: Medicare Other | Attending: Family Medicine | Admitting: Family Medicine

## 2018-05-31 ENCOUNTER — Encounter (HOSPITAL_COMMUNITY): Payer: Self-pay | Admitting: Emergency Medicine

## 2018-05-31 ENCOUNTER — Other Ambulatory Visit: Payer: Self-pay | Admitting: Family Medicine

## 2018-05-31 DIAGNOSIS — N390 Urinary tract infection, site not specified: Secondary | ICD-10-CM | POA: Diagnosis not present

## 2018-05-31 LAB — POCT URINALYSIS DIP (DEVICE)
Bilirubin Urine: NEGATIVE
Glucose, UA: NEGATIVE mg/dL
Ketones, ur: NEGATIVE mg/dL
NITRITE: NEGATIVE
PH: 5.5 (ref 5.0–8.0)
PROTEIN: NEGATIVE mg/dL
Specific Gravity, Urine: <=1.005 (ref 1.005–1.030)
UROBILINOGEN UA: 0.2 mg/dL (ref 0.0–1.0)

## 2018-05-31 MED ORDER — NITROFURANTOIN MONOHYD MACRO 100 MG PO CAPS: 100.0000 mg | ORAL_CAPSULE | Freq: Two times a day (BID) | ORAL | 0 refills | Status: DC

## 2018-05-31 NOTE — Discharge Instructions (Signed)
Your doctor is already sent a medication to treat urinary tract infection Urine culture was positive for E. Coli Start taking the medicine as prescribed Follow up as needed for continued or worsening symptoms

## 2018-05-31 NOTE — ED Provider Notes (Signed)
[facility]    CSN: [id number] Arrival date & time: 05/31/18  1010     History   Chief Complaint Chief Complaint  Patient presents with  . Hematuria    HPI Jacqueline Jennings is a 68 y.o. female.   Patient is a 68 year old female that presents with hematuria, dysuria and urinary frequency x4 days.  She was recently seen by her PCP on Thursday where she was told she had negative urine test results.  Reports that her symptoms have worsened since.  She has not taken thing for her symptoms.  She does have a past medical history of nephrolithiasis.  Denies any fevers, chills, myalgias.  Denies any nausea, vomiting, diarrhea.  ROS per HPI      Past Medical History:  Diagnosis Date  . Acute medial meniscal tear 10/02/2011   right  . Allergy    seasonal  . Arthritis 09-27-11   Right knee/Cortisone injection 2'13  . Breast cancer ([place]) 2006   hx of with rediation, surgery and adjuvant theapy with tamoxifen  (year two)  . Colon polyps 2014  . Hyperlipidemia   . Hypothyroidism 09-27-11   Low function  . IBS (irritable bowel syndrome)   . Nephrolithiasis    hx of  . NEPHROLITHIASIS, HX OF 11/26/2006   Qualifier: Diagnosis of  By: Jimmye Norman, LPN, [place] WHETHER GEN/LOC [facility] SITE 02/28/2009   Qualifier: Diagnosis of  By: Arnoldo Morale MD, Balinda Quails Sleep apnea 09-27-11   Cpap used ,started 2'2011    Patient Active Problem List   Diagnosis Date Noted  . B12 deficiency 03/08/2016  . Vitamin D deficiency 06/06/2009  . Obstructive sleep apnea 01/11/2009  . Hypothyroidism 07/10/2007  . Hyperlipemia 05/19/2007  . IRRITABLE BOWEL SYNDROME 05/19/2007  . BREAST CANCER, HX OF 05/19/2007    Past Surgical History:  Procedure Laterality Date  . BREAST BIOPSY Right 1977  . BREAST LUMPECTOMY Right 2006   raditon 33 tx-right  . CATARACT EXTRACTION Bilateral 2008  . CYSTOSCOPY  1977  . KNEE ARTHROSCOPY  10/02/2011   Procedure: ARTHROSCOPY KNEE;  Surgeon:  Gearlean Alf, MD;  Location: [facility];  Service: Orthopedics;  Laterality: Right;  with Debridement, right knee medial meniscus  . MYOMECTOMY  1990  . TONSILLECTOMY      OB History   No obstetric history on file.      Home Medications    Prior to Admission medications   Medication Sig Start Date End Date Taking? Authorizing Provider  acetaminophen (TYLENOL) 650 MG CR tablet Take 650 mg by mouth as needed for pain.    [provider]  Cholecalciferol (VITAMIN D-3 PO) Take 1,000 Units by mouth daily.     [provider]  Cyanocobalamin (VITAMIN B-12) 5000 MCG TBDP Take 2,500 mg by mouth 2 (two) times daily.     [provider]  fexofenadine (ALLEGRA) 180 MG tablet Take 180 mg by mouth daily.    [provider]  metroNIDAZOLE (METROGEL) 0.75 % gel 2 (two) times daily. Apply to face bid 11/21/14   [provider]  NATURE-THROID 65 MG tablet Take 1 tablet (65 mg total) by mouth daily. 03/09/18   Philemon Kingdom, MD  nitrofurantoin, macrocrystal-monohydrate, (MACROBID) 100 MG capsule Take 1 capsule (100 mg total) by mouth 2 (two) times daily. 05/31/18   Lucretia Kern, DO    Family History Family History  Problem Relation Age of Onset  . Hypertension Mother   .  Leukemia Mother   . Diabetes Father   . Uterine cancer Maternal Grandmother   . Colon cancer Neg Hx     Social History Social History   Tobacco Use  . Smoking status: Never Smoker  . Smokeless tobacco: Never Used  Substance Use Topics  . Alcohol use: Yes    Alcohol/week: 0.0 standard drinks    Comment: rare occ  . Drug use: No     Allergies   Augmentin [amoxicillin-pot clavulanate]; Latex; and Nabumetone   Review of Systems Review of Systems   Physical Exam Triage Vital Signs ED Triage Vitals  Enc Vitals Group     BP 05/31/18 1100 (!) 147/87     Pulse Rate 05/31/18 1100 87     Resp 05/31/18 1100 18     Temp 05/31/18 1100 97.9 F (36.6 C)     Temp Source  05/31/18 1100 Oral     SpO2 05/31/18 1100 95 %     Weight --      Height --      Head Circumference --      Peak Flow --      Pain Score 05/31/18 1104 0     Pain Loc --      Pain Edu? --      Excl. in [place]? --    No data found.  Updated Vital Signs BP (!) 147/87 (BP Location: Left Arm)   Pulse 87   Temp 97.9 F (36.6 C) (Oral)   Resp 18   SpO2 95%   Visual Acuity Right Eye Distance:   Left Eye Distance:   Bilateral Distance:    Right Eye Near:   Left Eye Near:    Bilateral Near:     Physical Exam Vitals signs and nursing note reviewed.  Constitutional:      General: She is not in acute distress.    Appearance: Normal appearance. She is well-developed. She is not ill-appearing, toxic-appearing or diaphoretic.  HENT:     Head: Normocephalic and atraumatic.     Nose: Nose normal.  Eyes:     Conjunctiva/sclera: Conjunctivae normal.  Neck:     Musculoskeletal: Normal range of motion.  Pulmonary:     Effort: Pulmonary effort is normal. No respiratory distress.  Musculoskeletal: Normal range of motion.  Skin:    General: Skin is warm and dry.  Neurological:     Mental Status: She is alert.  Psychiatric:        Mood and Affect: Mood normal.      UC Treatments / Results  Labs (all labs ordered are listed, but only abnormal results are displayed) Labs Reviewed  POCT URINALYSIS DIP (DEVICE) - Abnormal; Notable for the following components:      Result Value   Hgb urine dipstick LARGE (*)    Leukocytes, UA SMALL (*)    All other components within normal limits  URINE CULTURE    EKG None  Radiology No results found.  Procedures Procedures (including critical care time)  Medications Ordered in UC Medications - No data to display  Initial Impression / Assessment and Plan / UC Course  I have reviewed the triage vital signs and the nursing notes.  Pertinent labs & imaging results that were available during my care of the patient were reviewed by me and  considered in my medical decision making (see chart for details).     Urine with leuks and large amount of blood. Urine culture that was done previously at her  doctor's office showed E. Coli. It appears as if her primary care provider called in [place] to the pharmacy this morning. Patient was unaware of this. Instructed patient to go ahead and pick up the Macrobid as prescribed by her doctor She will take this twice a day for 5 days Follow up as needed for continued or worsening symptoms  Final Clinical Impressions(s) / UC Diagnoses   Final diagnoses:  Lower urinary tract infectious disease     Discharge Instructions     Your doctor is already sent a medication to treat urinary tract infection Urine culture was positive for E. Coli Start taking the medicine as prescribed Follow up as needed for continued or worsening symptoms     ED Prescriptions    None     Controlled Substance Prescriptions [place] Controlled Substance Registry consulted? Not Applicable   Orvan July, NP 05/31/18 1157

## 2018-05-31 NOTE — ED Triage Notes (Signed)
The patient presented to the [facility] with a complaint of hematuria, dysuria and urinary frequency x 4 days. The patient reported seeing her PCP on Thursday and having a negative urine test however the symptoms have gotten worse.

## 2018-06-01 LAB — URINE CULTURE: CULTURE: NO GROWTH

## 2018-06-01 NOTE — Telephone Encounter (Signed)
See results note. 

## 2018-06-08 ENCOUNTER — Other Ambulatory Visit: Payer: Medicare Other

## 2018-06-09 DIAGNOSIS — M23322 Other meniscus derangements, posterior horn of medial meniscus, left knee: Secondary | ICD-10-CM | POA: Diagnosis not present

## 2018-06-09 DIAGNOSIS — G8918 Other acute postprocedural pain: Secondary | ICD-10-CM | POA: Diagnosis not present

## 2018-06-09 DIAGNOSIS — M958 Other specified acquired deformities of musculoskeletal system: Secondary | ICD-10-CM | POA: Diagnosis not present

## 2018-06-15 DIAGNOSIS — S83249A Other tear of medial meniscus, current injury, unspecified knee, initial encounter: Secondary | ICD-10-CM | POA: Insufficient documentation

## 2018-07-07 ENCOUNTER — Other Ambulatory Visit: Payer: Medicare Other

## 2018-07-16 ENCOUNTER — Other Ambulatory Visit: Payer: Self-pay | Admitting: Internal Medicine

## 2018-07-16 DIAGNOSIS — E039 Hypothyroidism, unspecified: Secondary | ICD-10-CM

## 2018-07-17 ENCOUNTER — Ambulatory Visit: Payer: Self-pay | Admitting: *Deleted

## 2018-07-17 ENCOUNTER — Encounter: Payer: Self-pay | Admitting: Family Medicine

## 2018-07-17 ENCOUNTER — Other Ambulatory Visit: Payer: Self-pay | Admitting: Family Medicine

## 2018-07-17 ENCOUNTER — Telehealth (INDEPENDENT_AMBULATORY_CARE_PROVIDER_SITE_OTHER): Payer: Medicare Other | Admitting: Family Medicine

## 2018-07-17 DIAGNOSIS — N390 Urinary tract infection, site not specified: Secondary | ICD-10-CM | POA: Diagnosis not present

## 2018-07-17 DIAGNOSIS — N3000 Acute cystitis without hematuria: Secondary | ICD-10-CM

## 2018-07-17 MED ORDER — NITROFURANTOIN MONOHYD MACRO 100 MG PO CAPS: 100.0000 mg | ORAL_CAPSULE | Freq: Two times a day (BID) | ORAL | 0 refills | Status: DC

## 2018-07-17 NOTE — Telephone Encounter (Signed)
Virtual Visit via Telephone Note  I connected with Jacqueline Jennings on 07/17/18 at  by telephone and verified that I am speaking with the correct person using two identifiers.   I discussed the limitations, risks, security and privacy concerns of performing an evaluation and management service by telephone and the availability of in person appointments. I also discussed with the patient that there may be a patient responsible charge related to this service. The patient expressed understanding and agreed to proceed.  Location patient: home Location provider: work or home office Participants present for the call: patient, provider Patient did not have a visit in the prior 7 days to address this/these issue(s).   History of Present Illness: Jacqueline Jennings is a 68 year old female who has a history of recurrent UTIs, which started to occur after menopause.  She has begun to note the symptoms early when she has them and wanted to make sure that she started treatment for her current symptoms.  She started having burning with urination this morning as well as urgency and frequency.  She has not noticed any blood in the urine.  She has not had any fever, chills, back pain, or abdominal pain.  She has never been hospitalized with a urinary tract infection.  Her previous infections were treated with Macrobid, which she tolerated well and urine cultures revealed sensitivity to.   Observations/Objective: Patient sounds cheerful and well on the phone. I do not appreciate any SOB. Speech and thought processing are grossly intact. Patient reported vitals:none  Assessment and Plan: 1. Acute cystitis without hematuria We will treat with Macrobid.  We discussed that 5-day course should be sufficient.  Ideally we would obtain a culture, but I would prefer to keep her out of the lab/office if possible due to current corona situation.  We did discuss in the future repeating a urine culture to make sure that she is  completely clearing infections when they occur.  We discussed that it is likely she is getting recurrent infections due to a lack of hormones, but due to history of breast cancer we would prefer to keep her off of any hormones that may help with some atrophy.  We also discussed disc infections continue to occur we could consider urology evaluation since sometimes they are able to find issues like urinary retention which may be contributing to infection.  I encouraged her to continue to keep up with fluids and empty bladder on a frequent basis.  Let us know if any symptoms worsen or she does not feel like symptoms are starting to resolve by next week.   Follow Up Instructions:  Follow-up as needed if symptoms worsen.   [id number] [id number] [id number] 11-20 [id number] 21-30 I did not refer this patient for an OV in the next 24 hours for this/these issue(s).  I discussed the assessment and treatment plan with the patient. The patient was provided an opportunity to ask questions and all were answered. The patient agreed with the plan and demonstrated an understanding of the instructions.   The patient was advised to call back or seek an in-person evaluation if the symptoms worsen or if the condition fails to improve as anticipated.  I provided 10 minutes of non-face-to-face time during this encounter.   [name], MD

## 2018-07-17 NOTE — Telephone Encounter (Signed)
Telephone encounter completed

## 2018-07-17 NOTE — Progress Notes (Signed)
Error; note created in telephone template.

## 2018-07-17 NOTE — Telephone Encounter (Signed)
Contacted pt regarding her symptoms; she says that she had burning, frequency, urgency, and pain, and cloudy urine; her symptoms started when she woke up this morning; she says that she and Dr Maudie Mercury discussed this before; she normally sees Dr Maudie Mercury but this provider has no availability today; the pt agrees to see any provider; the pt agrees to a virtual visit; her email is [email or web address]; she can also be contacted at [phone] and a message can be left on the voicemail; contacted Arbie Cookey at [facility], and she states that the pt will be contacted to schedule the appointment; pt notified and she verbalized understanding; will route to office for notification.   Reason for Disposition . Urinating more frequently than usual (i.e., frequency)  Answer Assessment - Initial Assessment Questions 1. SYMPTOM: "What's the main symptom you're concerned about?" (e.g., frequency, incontinence)   burning 2. ONSET: "When did the  start?"     07/17/2018 3. PAIN: "Is there any pain?" If so, ask: "How bad is it?" (Scale: 1-10; mild, moderate, severe)     5-6 out of 10 4. CAUSE: "What do you think is causing the symptoms?"   uti 5. OTHER SYMPTOMS: "Do you have any other symptoms?" (e.g., fever, flank pain, blood in urine, pain with urination)    Frequency, urgency, cloudy urine; back pain on 07/16/2018 6. PREGNANCY: "Is there any chance you are pregnant?" "When was your last menstrual period?"     No menopause  Protocols used: URINARY [facility]

## 2018-07-23 ENCOUNTER — Telehealth: Payer: Self-pay | Admitting: *Deleted

## 2018-07-23 NOTE — Telephone Encounter (Signed)
I called the pt and left a detailed message Dr Maudie Mercury will be available for a visit via Webex until the end of April and she could call back for an appt for this and/or the transfer of care visit also.

## 2018-07-23 NOTE — Telephone Encounter (Signed)
Copied from [place] [phone] [id number]. Topic: Appointment Scheduling - Transfer of Care >> Jul 22, 2018  1:59 PM Jacqueline Jennings wrote: Pt calling after receiving letter from Dr. Maudie Mercury that she is leaving and she would like to be established with Dr. Ethlyn Gallery.  Pt states she is extremely distressed that in the midst of this pandemic she doesn't have a doctor.  Pt doesn't want to come in to office at this time, but wants a call back.

## 2018-07-28 ENCOUNTER — Other Ambulatory Visit: Payer: Medicare Other

## 2018-08-17 ENCOUNTER — Telehealth: Payer: Self-pay | Admitting: *Deleted

## 2018-08-17 ENCOUNTER — Encounter: Payer: Self-pay | Admitting: Family Medicine

## 2018-08-17 ENCOUNTER — Ambulatory Visit (INDEPENDENT_AMBULATORY_CARE_PROVIDER_SITE_OTHER): Payer: Medicare Other | Admitting: Family Medicine

## 2018-08-17 ENCOUNTER — Other Ambulatory Visit: Payer: Self-pay

## 2018-08-17 DIAGNOSIS — L719 Rosacea, unspecified: Secondary | ICD-10-CM | POA: Diagnosis not present

## 2018-08-17 DIAGNOSIS — E538 Deficiency of other specified B group vitamins: Secondary | ICD-10-CM

## 2018-08-17 DIAGNOSIS — E559 Vitamin D deficiency, unspecified: Secondary | ICD-10-CM

## 2018-08-17 DIAGNOSIS — Z853 Personal history of malignant neoplasm of breast: Secondary | ICD-10-CM

## 2018-08-17 DIAGNOSIS — E039 Hypothyroidism, unspecified: Secondary | ICD-10-CM

## 2018-08-17 DIAGNOSIS — G4733 Obstructive sleep apnea (adult) (pediatric): Secondary | ICD-10-CM

## 2018-08-17 DIAGNOSIS — E785 Hyperlipidemia, unspecified: Secondary | ICD-10-CM

## 2018-08-17 NOTE — Telephone Encounter (Signed)
-----   Message from Caren Macadam, MD sent at 08/17/2018  3:14 PM EDT ----- Patient was asking about follow up and scheduling - she has November appointment scheduled with [facility]; my suggestion would be to make this her chronic visit in office (with me) and then to schedule a separate AWV with Dr. Maudie Mercury (which could be scheduled in October a year from her last with Manuela Schwartz). I did place bloodwork for cholesterol and B12 levels (Dr. Darnell Level has already ordered thyroid studies)

## 2018-08-17 NOTE — Progress Notes (Signed)
Virtual Visit via Video Note  I connected with@  on 08/17/18 at 11:30 AM EDT by a video enabled telemedicine application and verified that I am speaking with the correct person using two identifiers.  Location patient: home Location provider:work office Persons participating in the virtual visit: patient, provider  I discussed the limitations of evaluation and management by telemedicine and the availability of in person appointments. The patient expressed understanding and agreed to proceed.   Jacqueline Jennings DOB: [birth year] Encounter date: 08/17/2018  This is a 68 y.o. female who presents to establish care.  Chief Complaint  Patient presents with  . Transitions Of Care    History of present illness: Last time we spoke she had UTI. Doing better now.    She was curious about shingles vaccination. Wondering about updated shot.   OSA: follows with sleep specialist; uses machine nightly. Does well with machine and she is good about using it nightly. Also has dental appliance which she uses just when she travels which works well for her.   IBS:dx she got many years ago. Was having difficulties at that time. Not much of issue now. As long as she is eating healthy.   Hypothyroid:nature-thyroid 65mg  daily. Follows with Dr. Cruzita Lederer.  Hyperlipidemia: diet controlled  Lipid Panel     Component Value Date/Time   CHOL 194 02/17/2018 1159   TRIG 134.0 02/17/2018 1159   TRIG 112 02/21/2006 0916   HDL 56.70 02/17/2018 1159   CHOLHDL 3 02/17/2018 1159   VLDL 26.8 02/17/2018 1159   LDLCALC 110 (H) 02/17/2018 1159   Hx of breast cancer: mammogram is due in July. Released from specialty care. Does still see gyn.  B12 def: 5000 mcg every other day. (had decreased from 5038mcg daily after B12 levels were 1282 back in 01/2018. Vitamin D def: 1000 units daily supplement Seasonal allergies: allegra - stays on this daily. Does ok if she takes daily. Sx bad if she misses.  Still sees derm; Dr.  Rozann Lesches.   Arthritis: has just had arthroscopic knee surgery. Had torn meniscus and some arthritis in there. Still following with ortho and has not been released yet. Wasn't able to complete outpatient therapy due to coronavirus.   Past Medical History:  Diagnosis Date  . Acute medial meniscal tear 10/02/2011   right  . Allergy    seasonal  . Arthritis 09-27-11   Right knee/Cortisone injection 2'13  . Breast cancer ([place]) 2006   hx of with rediation, surgery and adjuvant theapy with tamoxifen  (year two)  . Colon polyps 2014  . Hyperlipidemia   . Hypothyroidism 09-27-11   Low function  . IBS (irritable bowel syndrome)   . Irritable bowel syndrome 05/19/2007   Qualifier: Diagnosis of  By: Arnoldo Morale MD, [place], HX OF 11/26/2006   Qualifier: Diagnosis of  By: Jimmye Norman, LPN, [place] WHETHER GEN/LOC [facility] SITE 02/28/2009   Qualifier: Diagnosis of  By: Arnoldo Morale MD, Balinda Quails Sleep apnea 09-27-11   Cpap used ,started 6'2947   Past Surgical History:  Procedure Laterality Date  . BREAST BIOPSY Right 1977  . BREAST LUMPECTOMY Right 2006   raditon 33 tx-right  . CATARACT EXTRACTION Bilateral 2008  . CYSTOSCOPY  1977  . DILATION AND CURETTAGE OF UTERUS  2011   done after thickening of uterine lining from tamoxifen  . KNEE ARTHROSCOPY  10/02/2011   Procedure: ARTHROSCOPY KNEE;  Surgeon: Gearlean Alf, MD;  Location: [facility];  Service: Orthopedics;  Laterality: Right;  with Debridement, right knee medial meniscus  . left knee arthroscopy  2020  . MYOMECTOMY  1990  . TONSILLECTOMY     Allergies  Allergen Reactions  . Adhesive [Tape]   . Augmentin [Amoxicillin-Pot Clavulanate] Rash    Drug eruption  . Latex Rash    Adhesives products-causes reddness, rash  . Nabumetone     REACTION: Rash,Hives   Current Meds  Medication Sig  . acetaminophen (TYLENOL) 650 MG CR tablet Take 650 mg by mouth as needed for pain.  . Cholecalciferol (VITAMIN D-3 PO)  Take 1,000 Units by mouth daily.   . Cyanocobalamin (VITAMIN B-12) 5000 MCG TBDP Take 2,500 mg by mouth 2 (two) times daily.   . fexofenadine (ALLEGRA) 180 MG tablet Take 180 mg by mouth daily.  . metroNIDAZOLE (METROGEL) 0.75 % gel 2 (two) times daily. Apply to face bid  . NATURE-THROID 65 MG tablet Take 1 tablet (65 mg total) by mouth daily.   Current Facility-Administered Medications for the 08/17/18 encounter (Office Visit) with Caren Macadam, MD  Medication  . 0.9 %  sodium chloride infusion   Social History   Tobacco Use  . Smoking status: Never Smoker  . Smokeless tobacco: Never Used  Substance Use Topics  . Alcohol use: Yes    Alcohol/week: 0.0 standard drinks    Comment: rare occ   Family History  Problem Relation Age of Onset  . Hypertension Mother   . Leukemia Mother   . Diabetes Father   . Uterine cancer Maternal Grandmother   . Colon cancer Neg Hx      Review of Systems  Constitutional: Negative for chills, fatigue and fever.  Respiratory: Negative for cough, chest tightness, shortness of breath and wheezing.   Cardiovascular: Negative for chest pain, palpitations and leg swelling.    Objective:  There were no vitals taken for this visit.      BP Readings from Last 3 Encounters:  05/31/18 (!) 147/87  05/28/18 102/72  05/26/18 120/64   Wt Readings from Last 3 Encounters:  05/26/18 204 lb 14.4 oz (92.9 kg)  03/09/18 211 lb (95.7 kg)  03/03/18 212 lb (96.2 kg)    EXAM:  GENERAL: alert, oriented, appears well and in no acute distress  HEENT: atraumatic, conjunctiva clear, no obvious abnormalities on inspection of external nose and ears  NECK: normal movements of the head and neck  LUNGS: on inspection no signs of respiratory distress, breathing rate appears normal, no obvious gross SOB, gasping or wheezing  CV: no obvious cyanosis  MS: moves all visible extremities without noticeable abnormality  PSYCH/NEURO: pleasant and cooperative, no  obvious depression or anxiety, speech and thought processing grossly intact  SKIN: no abnormal skin lesions noted.   Assessment/Plan  1. Obstructive sleep apnea Follows with specialist. Does well with cpap.   2. Hyperlipidemia, unspecified hyperlipidemia type Diet controlled; will order recheck for fall.   3. Rosacea Follows with derm; controlled with metrogel.  4. B12 deficiency Followed by endo; will add recheck b12 level for fall  5. BREAST CANCER, HX OF No longer following with specialist; she does get regular mammograms through obgyn.   6. Vitamin D deficiency Taking supplementation.   7. Acquired hypothyroidism Follows with endo; has done well on current medication.     I discussed the assessment and treatment plan with the patient. The patient was provided an opportunity to ask questions and all were answered. The patient  agreed with the plan and demonstrated an understanding of the instructions.   The patient was advised to call back or seek an in-person evaluation if the symptoms worsen or if the condition fails to improve as anticipated.  I provided 30 minutes of non-face-to-face time during this encounter.   [name], MD

## 2018-08-17 NOTE — Telephone Encounter (Signed)
I called the pt and scheduled a lab appt and appts with Dr Ethlyn Gallery and Dr Maudie Mercury.

## 2018-10-05 ENCOUNTER — Ambulatory Visit: Payer: Medicare Other | Admitting: Family Medicine

## 2018-10-21 DIAGNOSIS — M25562 Pain in left knee: Secondary | ICD-10-CM | POA: Diagnosis not present

## 2018-10-27 DIAGNOSIS — M25562 Pain in left knee: Secondary | ICD-10-CM | POA: Diagnosis not present

## 2018-10-29 DIAGNOSIS — M25562 Pain in left knee: Secondary | ICD-10-CM | POA: Diagnosis not present

## 2018-11-02 DIAGNOSIS — M25562 Pain in left knee: Secondary | ICD-10-CM | POA: Diagnosis not present

## 2018-11-05 DIAGNOSIS — M25562 Pain in left knee: Secondary | ICD-10-CM | POA: Diagnosis not present

## 2018-11-06 DIAGNOSIS — H43813 Vitreous degeneration, bilateral: Secondary | ICD-10-CM | POA: Diagnosis not present

## 2018-11-06 DIAGNOSIS — H40013 Open angle with borderline findings, low risk, bilateral: Secondary | ICD-10-CM | POA: Diagnosis not present

## 2018-11-06 DIAGNOSIS — H52203 Unspecified astigmatism, bilateral: Secondary | ICD-10-CM | POA: Diagnosis not present

## 2018-11-06 DIAGNOSIS — Z961 Presence of intraocular lens: Secondary | ICD-10-CM | POA: Diagnosis not present

## 2018-11-10 DIAGNOSIS — M25562 Pain in left knee: Secondary | ICD-10-CM | POA: Diagnosis not present

## 2018-11-12 DIAGNOSIS — M25562 Pain in left knee: Secondary | ICD-10-CM | POA: Diagnosis not present

## 2018-11-16 DIAGNOSIS — M25562 Pain in left knee: Secondary | ICD-10-CM | POA: Diagnosis not present

## 2018-11-19 DIAGNOSIS — M25562 Pain in left knee: Secondary | ICD-10-CM | POA: Diagnosis not present

## 2018-11-24 DIAGNOSIS — Z1231 Encounter for screening mammogram for malignant neoplasm of breast: Secondary | ICD-10-CM | POA: Diagnosis not present

## 2018-11-24 DIAGNOSIS — Z853 Personal history of malignant neoplasm of breast: Secondary | ICD-10-CM | POA: Diagnosis not present

## 2018-11-24 LAB — HM MAMMOGRAPHY

## 2018-11-26 DIAGNOSIS — M25562 Pain in left knee: Secondary | ICD-10-CM | POA: Diagnosis not present

## 2018-11-26 DIAGNOSIS — M1712 Unilateral primary osteoarthritis, left knee: Secondary | ICD-10-CM | POA: Diagnosis not present

## 2018-12-24 DIAGNOSIS — M1712 Unilateral primary osteoarthritis, left knee: Secondary | ICD-10-CM | POA: Diagnosis not present

## 2019-02-15 ENCOUNTER — Other Ambulatory Visit: Payer: Self-pay

## 2019-02-15 ENCOUNTER — Other Ambulatory Visit (INDEPENDENT_AMBULATORY_CARE_PROVIDER_SITE_OTHER): Payer: Medicare Other

## 2019-02-15 DIAGNOSIS — E785 Hyperlipidemia, unspecified: Secondary | ICD-10-CM

## 2019-02-15 DIAGNOSIS — E039 Hypothyroidism, unspecified: Secondary | ICD-10-CM | POA: Diagnosis not present

## 2019-02-15 DIAGNOSIS — E538 Deficiency of other specified B group vitamins: Secondary | ICD-10-CM

## 2019-02-15 LAB — COMPREHENSIVE METABOLIC PANEL
ALT: 14 U/L (ref 0–35)
AST: 15 U/L (ref 0–37)
Albumin: 4.1 g/dL (ref 3.5–5.2)
Alkaline Phosphatase: 66 U/L (ref 39–117)
BUN: 15 mg/dL (ref 6–23)
CO2: 27 mEq/L (ref 19–32)
Calcium: 9.4 mg/dL (ref 8.4–10.5)
Chloride: 105 mEq/L (ref 96–112)
Creatinine, Ser: 0.75 mg/dL (ref 0.40–1.20)
GFR: 77.01 mL/min (ref >60.00)
Glucose, Bld: 96 mg/dL (ref 70–99)
Potassium: 4.4 mEq/L (ref 3.5–5.1)
Sodium: 141 mEq/L (ref 135–145)
Total Bilirubin: 0.6 mg/dL (ref 0.2–1.2)
Total Protein: 6.2 g/dL (ref 6.0–8.3)

## 2019-02-15 LAB — T3, FREE: T3, Free: 3.5 pg/mL (ref 2.3–4.2)

## 2019-02-15 LAB — LIPID PANEL
Cholesterol: 197 mg/dL (ref 0–200)
HDL: 51.8 mg/dL (ref >39.00)
LDL Cholesterol: 122 mg/dL — ABNORMAL HIGH (ref 0–99)
NonHDL: 145.29
Total CHOL/HDL Ratio: 4
Triglycerides: 115 mg/dL (ref 0.0–149.0)
VLDL: 23 mg/dL (ref 0.0–40.0)

## 2019-02-15 LAB — VITAMIN B12: Vitamin B-12: 608 pg/mL (ref 211–911)

## 2019-02-15 LAB — TSH: TSH: 3.18 u[IU]/mL (ref 0.35–4.50)

## 2019-02-15 LAB — T4, FREE: Free T4: 0.75 ng/dL (ref 0.60–1.60)

## 2019-02-22 ENCOUNTER — Ambulatory Visit (INDEPENDENT_AMBULATORY_CARE_PROVIDER_SITE_OTHER): Payer: Medicare Other | Admitting: Family Medicine

## 2019-02-22 ENCOUNTER — Encounter: Payer: Self-pay | Admitting: Family Medicine

## 2019-02-22 ENCOUNTER — Ambulatory Visit: Payer: Medicare Other | Admitting: Family Medicine

## 2019-02-22 ENCOUNTER — Other Ambulatory Visit: Payer: Self-pay

## 2019-02-22 VITALS — BP 122/70 | HR 74 | Temp 97.4°F | Ht 66.0 in | Wt 215.7 lb

## 2019-02-22 DIAGNOSIS — Z23 Encounter for immunization: Secondary | ICD-10-CM

## 2019-02-22 DIAGNOSIS — E538 Deficiency of other specified B group vitamins: Secondary | ICD-10-CM | POA: Diagnosis not present

## 2019-02-22 DIAGNOSIS — E039 Hypothyroidism, unspecified: Secondary | ICD-10-CM | POA: Diagnosis not present

## 2019-02-22 DIAGNOSIS — L719 Rosacea, unspecified: Secondary | ICD-10-CM

## 2019-02-22 DIAGNOSIS — E785 Hyperlipidemia, unspecified: Secondary | ICD-10-CM | POA: Diagnosis not present

## 2019-02-22 DIAGNOSIS — Z853 Personal history of malignant neoplasm of breast: Secondary | ICD-10-CM | POA: Diagnosis not present

## 2019-02-22 DIAGNOSIS — E559 Vitamin D deficiency, unspecified: Secondary | ICD-10-CM

## 2019-02-22 DIAGNOSIS — G4733 Obstructive sleep apnea (adult) (pediatric): Secondary | ICD-10-CM

## 2019-02-22 NOTE — Progress Notes (Signed)
Belky Coltharp Buffkin DOB: [birth year] Encounter date: 02/22/2019  This is a 68 y.o. female who presents with Chief Complaint  Patient presents with  . Follow-up    History of present illness:  Has been back in gym and doing bike, eliptycal and things from therapy. Walking is difficult because knee is slowing her down. Knee is getting better, but it just slow. Has a follow up next month.   OSA: follows with sleep specialist; uses machine nightly. Does well with machine and she is good about using it nightly. Also has dental appliance which she uses just when she travels which works well for her. Couldn't even get through on phone to make appointment.   Hypothyroid:nature-thyroid 65mg  daily. Follows with Dr. Cruzita Lederer.  Hyperlipidemia: diet controlled.  Hx of breast cancer: mammogram is due in July: ?result? .States that she had it done in august. Goes to [place].    Released from specialty care. Does still see gyn- [facility]. Has appointment this month with her.   B12 def: 5000 mcg every other day.   Vitamin D def: 1000 units daily supplement  Seasonal allergies: allegra - Doing well with this now. Controls symptoms.   Still sees derm; Dr. Rozann Lesches. Has appointment this month.     Allergies  Allergen Reactions  . Adhesive [Tape]   . Augmentin [Amoxicillin-Pot Clavulanate] Rash    Drug eruption  . Latex Rash    Adhesives products-causes reddness, rash  . Nabumetone     REACTION: Rash,Hives   Current Meds  Medication Sig  . acetaminophen (TYLENOL) 650 MG CR tablet Take 650 mg by mouth as needed for pain.  . Cholecalciferol (VITAMIN D-3 PO) Take 1,000 Units by mouth daily.   . Cyanocobalamin (VITAMIN B-12) 5000 MCG TBDP Take 2,500 mg by mouth 2 (two) times daily.   . fexofenadine (ALLEGRA) 180 MG tablet Take 180 mg by mouth daily.  . metroNIDAZOLE (METROGEL) 0.75 % gel 2 (two) times daily. Apply to face bid  . NATURE-THROID 65 MG tablet Take 1 tablet (65  mg total) by mouth daily.   Current Facility-Administered Medications for the 02/22/19 encounter (Office Visit) with Caren Macadam, MD  Medication  . 0.9 %  sodium chloride infusion    Review of Systems  Constitutional: Negative for chills, fatigue and fever.  Respiratory: Negative for cough, chest tightness, shortness of breath and wheezing.   Cardiovascular: Negative for chest pain, palpitations and leg swelling.  Musculoskeletal: Positive for arthralgias.    Objective:  BP 122/70 (BP Location: Left Arm, Patient Position: Sitting, Cuff Size: Large)   Pulse 74   Temp (!) 97.4 F (36.3 C) (Temporal)   Ht 5\' 6"  (1.676 m)   Wt 215 lb 11.2 oz (97.8 kg)   SpO2 98%   BMI 34.81 kg/m   Weight: 215 lb 11.2 oz (97.8 kg)   BP Readings from Last 3 Encounters:  02/22/19 122/70  05/31/18 (!) 147/87  05/28/18 102/72   Wt Readings from Last 3 Encounters:  02/22/19 215 lb 11.2 oz (97.8 kg)  05/26/18 204 lb 14.4 oz (92.9 kg)  03/09/18 211 lb (95.7 kg)    Physical Exam Constitutional:      General: She is not in acute distress.    Appearance: She is well-developed.  Cardiovascular:     Rate and Rhythm: Normal rate and regular rhythm.     Heart sounds: Normal heart sounds. No murmur. No friction rub.  Pulmonary:     Effort: Pulmonary effort  is normal. No respiratory distress.     Breath sounds: Normal breath sounds. No wheezing or rales.  Musculoskeletal:     Right lower leg: No edema.     Left lower leg: No edema.  Neurological:     Mental Status: She is alert and oriented to person, place, and time.  Psychiatric:        Behavior: Behavior normal.     Assessment/Plan 1. Acquired hypothyroidism Following with endocrinology  2. Hyperlipidemia, unspecified hyperlipidemia type Diet controlled  3. Obstructive sleep apnea Using cpap nightly. Will schedule with pulmonology.   4. Vitamin D deficiency Continue with otc medication  5. BREAST CANCER, HX OF Follows with  Dr. Helane Rima.  6. Rosacea metrogel.  7. B12 deficiency Continue with supplement.   8. Need for immunization against influenza - Flu Vaccine QUAD High Dose(Fluad)  Return in about 1 year (around 02/22/2020).    [name], MD

## 2019-02-22 NOTE — Patient Instructions (Signed)
Message me when you want to get the shingles vaccine and which pharmacy.   Message me before your next appointment so we can order bloodwork ahead of time.

## 2019-03-02 ENCOUNTER — Ambulatory Visit: Payer: Medicare Other | Admitting: Family Medicine

## 2019-03-03 DIAGNOSIS — L821 Other seborrheic keratosis: Secondary | ICD-10-CM | POA: Diagnosis not present

## 2019-03-03 DIAGNOSIS — Z1283 Encounter for screening for malignant neoplasm of skin: Secondary | ICD-10-CM | POA: Diagnosis not present

## 2019-03-03 DIAGNOSIS — L304 Erythema intertrigo: Secondary | ICD-10-CM | POA: Diagnosis not present

## 2019-03-09 ENCOUNTER — Other Ambulatory Visit: Payer: Self-pay

## 2019-03-10 DIAGNOSIS — Z6834 Body mass index (BMI) 34.0-34.9, adult: Secondary | ICD-10-CM | POA: Diagnosis not present

## 2019-03-10 DIAGNOSIS — Z124 Encounter for screening for malignant neoplasm of cervix: Secondary | ICD-10-CM | POA: Diagnosis not present

## 2019-03-10 DIAGNOSIS — Z779 Other contact with and (suspected) exposures hazardous to health: Secondary | ICD-10-CM | POA: Diagnosis not present

## 2019-03-11 ENCOUNTER — Encounter: Payer: Self-pay | Admitting: Internal Medicine

## 2019-03-11 ENCOUNTER — Ambulatory Visit (INDEPENDENT_AMBULATORY_CARE_PROVIDER_SITE_OTHER): Payer: Medicare Other | Admitting: Internal Medicine

## 2019-03-11 VITALS — BP 120/70 | HR 72 | Ht 66.0 in | Wt 217.0 lb

## 2019-03-11 DIAGNOSIS — Z8639 Personal history of other endocrine, nutritional and metabolic disease: Secondary | ICD-10-CM

## 2019-03-11 DIAGNOSIS — E039 Hypothyroidism, unspecified: Secondary | ICD-10-CM | POA: Diagnosis not present

## 2019-03-11 DIAGNOSIS — L659 Nonscarring hair loss, unspecified: Secondary | ICD-10-CM

## 2019-03-11 MED ORDER — NATURE-THROID 65 MG PO TABS: 65.0000 mg | ORAL_TABLET | Freq: Every day | ORAL | 3 refills | Status: DC

## 2019-03-11 NOTE — Patient Instructions (Addendum)
Please continue Nature-Throid 65 mg daily.  Take the thyroid hormones every day, with water, at least 30 minutes before breakfast, separated by at least 4 hours from: - acid reflux medications - calcium - iron - multivitamins  Please come back for a follow-up appointment in 1 year.

## 2019-03-11 NOTE — Progress Notes (Signed)
Patient ID: Jacqueline Jennings, female   DOB: 1951/02/04, 68 y.o.   MRN: [id number]   HPI  Jacqueline Jennings is a 68 y.o.-year-old female, returning for follow-up for hypothyroidism and B12 deficiency. Last visit 1 year ago.  She had L knee surgery 05/2018 (torn meniscus - Dr. Maureen Ralphs). She could not exercise 2/2 coronavs. pandemic.She is now exercising at the gym and feels she is regaining her strength lowly..  Reviewed history: Pt. has been dx with hypothyroidism in 2007-2008; started on Synthroid >> not feeling better >> was on Armour >> now Naturethyroid 65 mg.  Her TSH was overly suppressed in the past, as shown below. She was initially on a high dose of Armour: 120 mg, which we subsequently decreased to 90 mg, and then further to 60 mg >> currently on Nature-Throid.  Labs normalized and remained controlled afterwards..  She is on Nature-Throid 65 mg daily - in am - fasting - at least 30 min from b'fast - no Ca, Fe, MVI, PPIs - not on Biotin  She continues on vitamin D 1000 mcg daily.  Reviewed her TFTs: Lab Results  Component Value Date   TSH 3.18 02/15/2019   TSH 2.48 02/17/2018   TSH 2.88 03/07/2017   TSH 1.60 03/08/2016   TSH 1.87 03/09/2015   TSH 2.39 09/08/2014   TSH 1.67 05/24/2014   TSH 1.62 03/01/2014   TSH 0.03 (L) 01/12/2014   TSH 0.05 (L) 11/23/2013   FREET4 0.75 02/15/2019   FREET4 0.79 02/17/2018   FREET4 0.74 03/07/2017   FREET4 0.62 03/08/2016   FREET4 0.67 03/09/2015   FREET4 0.69 09/08/2014   FREET4 0.79 05/24/2014   FREET4 0.73 03/01/2014   FREET4 0.77 01/12/2014   FREET4 1.02 11/23/2013    Pt denies: - feeling nodules in neck - hoarseness - dysphagia - choking - SOB with lying down  We also diagnosed B12 deficiency in the past.  After starting 5000 mcg of methylcobalamin daily, her B12 was higher than target so she is taking now 5000 mcg every other day.  Reviewed B12 levels: Lab Results  Component Value Date   JHERDEYC14 481  02/15/2019   VITAMINB12 1,282 (H) 02/17/2018   VITAMINB12 982 (H) 02/14/2017   VITAMINB12 759 06/07/2016   VITAMINB12 298 02/22/2016   VITAMINB12 337 03/09/2015   VITAMINB12 280 12/14/2014   VITAMINB12 210 (L) 09/14/2014   She also has a history of BrCa 2006 >> had radiation therapy to right breast.  She lost her husband in 07/2016 due to esophageal cancer.  She was undergoing grief counseling.   ROS: Constitutional: + weight gain/no weight loss, no fatigue, no subjective hyperthermia, no subjective hypothermia Eyes: no blurry vision, no xerophthalmia ENT: no sore throat, + see HPI Cardiovascular: no CP/no SOB/no palpitations/no leg swelling Respiratory: no cough/no SOB/no wheezing Gastrointestinal: no N/no V/no D/no C/no acid reflux Musculoskeletal: no muscle aches/+ joint aches Skin: no rashes, + hair thinning Neurological: no tremors/no numbness/no tingling/no dizziness  I reviewed pt's medications, allergies, PMH, social hx, family hx, and changes were documented in the history of present illness. Otherwise, unchanged from my initial visit note.  Past Medical History:  Diagnosis Date  . Acute medial meniscal tear 10/02/2011   right  . Allergy    seasonal  . Arthritis 09-27-11   Right knee/Cortisone injection 2'13  . Breast cancer ([place]) 2006   hx of with rediation, surgery and adjuvant theapy with tamoxifen  (year two)  . Colon polyps 2014  . Hyperlipidemia   .  Hypothyroidism 09-27-11   Low function  . IBS (irritable bowel syndrome)   . Irritable bowel syndrome 05/19/2007   Qualifier: Diagnosis of  By: Arnoldo Morale MD, [place], HX OF 11/26/2006   Qualifier: Diagnosis of  By: Jimmye Norman, LPN, [place] WHETHER GEN/LOC [facility] SITE 02/28/2009   Qualifier: Diagnosis of  By: Arnoldo Morale MD, Balinda Quails Sleep apnea 09-27-11   Cpap used ,started 1'6010   Past Surgical History:  Procedure Laterality Date  . BREAST BIOPSY Right 1977  . BREAST  LUMPECTOMY Right 2006   raditon 33 tx-right  . CATARACT EXTRACTION Bilateral 2008  . CYSTOSCOPY  1977  . DILATION AND CURETTAGE OF UTERUS  2011   done after thickening of uterine lining from tamoxifen  . KNEE ARTHROSCOPY  10/02/2011   Procedure: ARTHROSCOPY KNEE;  Surgeon: Gearlean Alf, MD;  Location: [facility];  Service: Orthopedics;  Laterality: Right;  with Debridement, right knee medial meniscus  . left knee arthroscopy  2020  . MYOMECTOMY  1990  . TONSILLECTOMY     Social History   Socioeconomic History  . Marital status: Widowed    Spouse name: Not on file  . Number of children: 0  . Years of education: Not on file  . Highest education level: Not on file  Occupational History  . Not on file  Social Needs  . Financial resource strain: Not on file  . Food insecurity    Worry: Not on file    Inability: Not on file  . Transportation needs    Medical: Not on file    Non-medical: Not on file  Tobacco Use  . Smoking status: Never Smoker  . Smokeless tobacco: Never Used  Substance and Sexual Activity  . Alcohol use: Yes    Alcohol/week: 0.0 standard drinks    Comment: rare occ  . Drug use: No  . Sexual activity: Yes  Lifestyle  . Physical activity    Days per week: Not on file    Minutes per session: Not on file  . Stress: Not on file  Relationships  . Social Herbalist on phone: Not on file    Gets together: Not on file    Attends religious service: Not on file    Active member of club or organization: Not on file    Attends meetings of clubs or organizations: Not on file    Relationship status: Not on file  . Intimate partner violence    Fear of current or ex partner: Not on file    Emotionally abused: Not on file    Physically abused: Not on file    Forced sexual activity: Not on file  Other Topics Concern  . Not on file  Social History Narrative   Work or School: retired Education officer, museum, travels a lot      Home Situation: lives with husband       Spiritual Beliefs: Christian      Lifestyle: no regular exercise, healthy diet            Current Outpatient Medications on File Prior to Visit  Medication Sig Dispense Refill  . acetaminophen (TYLENOL) 650 MG CR tablet Take 650 mg by mouth as needed for pain.    . Cholecalciferol (VITAMIN D-3 PO) Take 1,000 Units by mouth daily.     . Cyanocobalamin (VITAMIN B-12) 5000 MCG TBDP Take 2,500 mg by mouth 2 (two) times daily.     Marland Kitchen  fexofenadine (ALLEGRA) 180 MG tablet Take 180 mg by mouth daily.    . metroNIDAZOLE (METROGEL) 0.75 % gel 2 (two) times daily. Apply to face bid  98  . NATURE-THROID 65 MG tablet Take 1 tablet (65 mg total) by mouth daily. 90 tablet 3   Current Facility-Administered Medications on File Prior to Visit  Medication Dose Route Frequency Provider Last Rate Last Dose  . 0.9 %  sodium chloride infusion  500 mL Intravenous Continuous Ladene Artist, MD       Allergies  Allergen Reactions  . Adhesive [Tape]   . Augmentin [Amoxicillin-Pot Clavulanate] Rash    Drug eruption  . Latex Rash    Adhesives products-causes reddness, rash  . Nabumetone     REACTION: Rash,Hives   Family History  Problem Relation Age of Onset  . Hypertension Mother   . Leukemia Mother   . Diabetes Father   . Uterine cancer Maternal Grandmother   . Colon cancer Neg Hx     PE: BP 120/70   Pulse 72   Ht 5' 6"  (1.676 m)   Wt 217 lb (98.4 kg)   SpO2 97%   BMI 35.02 kg/m  Body mass index is 34.81 kg/m. Wt Readings from Last 3 Encounters:  02/22/19 215 lb 11.2 oz (97.8 kg)  05/26/18 204 lb 14.4 oz (92.9 kg)  03/09/18 211 lb (95.7 kg)   Constitutional: overweight, in NAD Eyes: PERRLA, EOMI, no exophthalmos ENT: moist mucous membranes, no thyromegaly, no cervical lymphadenopathy Cardiovascular: RRR, No MRG Respiratory: CTA B Gastrointestinal: abdomen soft, NT, ND, BS+ Musculoskeletal: no deformities, strength intact in all 4 Skin: moist, warm, no rashes Neurological: no  tremor with outstretched hands, DTR normal in all 4  ASSESSMENT: 1. Hypothyroidism - on desiccated thyroid extract  2. B12 def  3. Hair loss  PLAN:  1. Patient with longstanding hypothyroidism, on Nature-Throid - latest thyroid labs reviewed with pt >> normal last month: Lab Results  Component Value Date   TSH 3.18 02/15/2019   - she continues on Nature-Throid 65 daily - pt feels good on this dose, but gained weight - we discussed about taking the thyroid hormone every day, with water, >30 minutes before breakfast, separated by >4 hours from acid reflux medications, calcium, iron, multivitamins. Pt. is taking it correctly. -I will see her back in a year  2. B12 def -She is on 5000 mcg B12 daily -No fatigue, numbness, tingling, no anemia -At last check, in fact/2019, her B12 level was high and her PCP advised her to decrease the dose to 1000 mcg daily.  She was reticent to decrease the dose much, so I advised her to at least take 5000 mcg every other day. She did this until she ran out of the 5000 mcg >> now takes 1000 mcg daily -She had a normal vitamin B12 level in 01/2019 - 3 mo after the change in dose - we will continue this dose  3. Hair loss -Probably initially related to stress of losing her husband, but she continues to have thin  hair -B12 is now normal - at last OV, we Discussed about starting biotin either in the form of B complex or has a number of vitamins at last visit >> did not start -she feels her hair is better now, so we do not need to start Biotin  Philemon Kingdom, MD PhD [facility] Endocrinology

## 2019-03-12 ENCOUNTER — Other Ambulatory Visit: Payer: Self-pay | Admitting: Internal Medicine

## 2019-03-12 ENCOUNTER — Telehealth: Payer: Self-pay | Admitting: Internal Medicine

## 2019-03-12 MED ORDER — ARMOUR THYROID 60 MG PO TABS: 60.0000 mg | ORAL_TABLET | Freq: Every day | ORAL | 3 refills | Status: DC

## 2019-03-12 NOTE — Telephone Encounter (Signed)
Patient has called and needs a substitute for the Nature-Throid 65 MG to be sent to [facility].  Nature-Throid 65 MG is on back order and is not dispnsed at this time.  Patient is running out of medication

## 2019-03-12 NOTE — Telephone Encounter (Signed)
Please note that patient has already sent a message to Dr. Cruzita Lederer this morning regarding this issue and awaiting response.  Note closed.

## 2019-04-02 DIAGNOSIS — M1712 Unilateral primary osteoarthritis, left knee: Secondary | ICD-10-CM | POA: Diagnosis not present

## 2019-04-02 DIAGNOSIS — M25562 Pain in left knee: Secondary | ICD-10-CM | POA: Diagnosis not present

## 2019-04-21 ENCOUNTER — Encounter: Payer: Self-pay | Admitting: *Deleted

## 2019-04-30 ENCOUNTER — Ambulatory Visit (INDEPENDENT_AMBULATORY_CARE_PROVIDER_SITE_OTHER): Payer: Medicare Other

## 2019-04-30 VITALS — Ht 66.0 in | Wt 215.0 lb

## 2019-04-30 DIAGNOSIS — Z13820 Encounter for screening for osteoporosis: Secondary | ICD-10-CM | POA: Diagnosis not present

## 2019-04-30 DIAGNOSIS — Z780 Asymptomatic menopausal state: Secondary | ICD-10-CM | POA: Diagnosis not present

## 2019-04-30 DIAGNOSIS — Z0000 Encounter for general adult medical examination without abnormal findings: Secondary | ICD-10-CM | POA: Diagnosis not present

## 2019-04-30 NOTE — Progress Notes (Signed)
This visit is being conducted via phone call due to the COVID-19 pandemic. This patient has given me verbal consent via phone to conduct this visit, patient states they are participating from their home address. Some vital signs may be absent or patient reported.   Patient identification: identified by name, DOB, and current address.  Location provider: [facility], Office Persons participating in the virtual visit: Ms. Arletta Torell and Franne Forts, LPN.    Subjective:   Jacqueline Jennings is a 69 y.o. female who presents for Medicare Annual (Subsequent) preventive examination.  She is doing quite well at this time. She wants to resume exercising at her gym to continue rehabilitation for her knee. She is ready to receive the covid vaccine as soon as she is elligible.  Review of Systems:  Cardiac Risk Factors include: sedentary lifestyle;dyslipidemia;advanced age (>4men, >70 women)     Objective:     Vitals: Ht 5\' 6"  (1.676 m)   Wt 215 lb (97.5 kg)   BMI 34.70 kg/m   Body mass index is 34.7 kg/m.  Advanced Directives 04/30/2019 02/17/2018 02/03/2015 09/27/2011  Does Patient Have a Medical Advance Directive? Yes Yes Yes Patient does not have advance directive  Type of Advance Directive [place];Living will - [place] -  Does patient want to make changes to medical advance directive? Yes (ED - Information included in AVS) - - -  Copy of [place] in Chart? No - copy requested - - -  Pre-existing out of facility DNR order (yellow form or pink MOST form) - - - No    Tobacco Social History   Tobacco Use  Smoking Status Never Smoker  Smokeless Tobacco Never Used     Counseling given: Not Answered   Clinical Intake:  Pre-visit preparation completed: Yes  Pain : No/denies pain     BMI - recorded: 34.7 Nutritional Status: BMI > 30  Obese Nutritional Risks: None Diabetes: No  How often do you need to have  someone help you when you read instructions, pamphlets, or other written materials from your doctor or pharmacy?: 1 - Never What is the last grade level you completed in school?: 4 years college  Interpreter Needed?: No  Information entered by :: Franne Forts, LPN.  Past Medical History:  Diagnosis Date  . Acute medial meniscal tear 10/02/2011   right  . Allergy    seasonal  . Arthritis 09-27-11   Right knee/Cortisone injection 2'13  . Breast cancer ([place]) 2006   hx of with rediation, surgery and adjuvant theapy with tamoxifen  (year two)  . Colon polyps 2014  . Hyperlipidemia   . Hypothyroidism 09-27-11   Low function  . IBS (irritable bowel syndrome)   . Irritable bowel syndrome 05/19/2007   Qualifier: Diagnosis of  By: Arnoldo Morale MD, [place], HX OF 11/26/2006   Qualifier: Diagnosis of  By: Jimmye Norman, LPN, [place] WHETHER GEN/LOC [facility] SITE 02/28/2009   Qualifier: Diagnosis of  By: Arnoldo Morale MD, Balinda Quails Sleep apnea 09-27-11   Cpap used ,started [id number]   Past Surgical History:  Procedure Laterality Date  . BREAST BIOPSY Right 1977  . BREAST LUMPECTOMY Right 2006   raditon 33 tx-right  . CATARACT EXTRACTION Bilateral 2008  . CYSTOSCOPY  1977  . DILATION AND CURETTAGE OF UTERUS  2011   done after thickening of uterine lining from tamoxifen  . KNEE ARTHROSCOPY  10/02/2011   Procedure: ARTHROSCOPY KNEE;  Surgeon: Gearlean Alf, MD;  Location: [facility];  Service: Orthopedics;  Laterality: Right;  with Debridement, right knee medial meniscus  . KNEE ARTHROSCOPY Left 05/2018  . left knee arthroscopy  2020  . MYOMECTOMY  1990  . TONSILLECTOMY     Family History  Problem Relation Age of Onset  . Hypertension Mother   . Leukemia Mother   . Diabetes Father   . Uterine cancer Maternal Grandmother   . Colon cancer Neg Hx    Social History   Socioeconomic History  . Marital status: Widowed    Spouse name: Not on file  . Number of  children: 0  . Years of education: 16  . Highest education level: Bachelor's degree (e.g., BA, AB, BS)  Occupational History  . Occupation: retired  Tobacco Use  . Smoking status: Never Smoker  . Smokeless tobacco: Never Used  Substance and Sexual Activity  . Alcohol use: Yes    Alcohol/week: 0.0 standard drinks    Comment: rare occ  . Drug use: No  . Sexual activity: Yes  Other Topics Concern  . Not on file  Social History Narrative   Work or School: retired Education officer, museum, travels a lot      widowed      Spiritual Beliefs: Christian               Social Determinants of Health   Financial Resource Strain: [place]   . Difficulty of Paying Living Expenses: Not hard at all  Food Insecurity: No Food Insecurity  . Worried About Charity fundraiser in the Last Year: Never true  . Ran Out of Food in the Last Year: Never true  Transportation Needs: No Transportation Needs  . Lack of Transportation (Medical): No  . Lack of Transportation (Non-Medical): No  Physical Activity: Insufficiently Active  . Days of Exercise per Week: 2 days  . Minutes of Exercise per Session: 30 min  Stress: No Stress Concern Present  . Feeling of Stress : Only a little  Social Connections: Slightly Isolated  . Frequency of Communication with Friends and Family: More than three times a week  . Frequency of Social Gatherings with Friends and Family: Once a week  . Attends Religious Services: More than 4 times per year  . Active Member of Clubs or Organizations: Yes  . Attends Archivist Meetings: More than 4 times per year  . Marital Status: Widowed    Outpatient Encounter Medications as of 04/30/2019  Medication Sig  . acetaminophen (TYLENOL) 650 MG CR tablet Take 650 mg by mouth as needed for pain.  Francia Greaves THYROID 60 MG tablet Take 1 tablet (60 mg total) by mouth daily before breakfast.  . Cholecalciferol (VITAMIN D-3 PO) Take 1,000 Units by mouth daily.   . Cyanocobalamin (VITAMIN  B-12) 5000 MCG TBDP Take 2,500 mg by mouth 2 (two) times daily.   . fexofenadine (ALLEGRA) 180 MG tablet Take 180 mg by mouth daily.  . metroNIDAZOLE (METROGEL) 0.75 % gel 2 (two) times daily. Apply to face bid  . TURMERIC PO Take by mouth daily.  Marland Kitchen gabapentin (NEURONTIN) 300 MG capsule    Facility-Administered Encounter Medications as of 04/30/2019  Medication  . 0.9 %  sodium chloride infusion    Activities of Daily Living In your present state of health, do you have any difficulty performing the following activities: 04/30/2019  Hearing? N  Vision? N  Difficulty concentrating  or making decisions? N  Walking or climbing stairs? N  Dressing or bathing? N  Doing errands, shopping? N  Preparing Food and eating ? N  Using the Toilet? N  In the past six months, have you accidently leaked urine? N  Do you have problems with loss of bowel control? N  Managing your Medications? N  Managing your Finances? N  Housekeeping or managing your Housekeeping? N  Some recent data might be hidden    Patient Care Team: Caren Macadam, MD as PCP - General (Family Medicine) Philemon Kingdom, MD as Consulting Physician (Internal Medicine) Dian Queen, MD as Consulting Physician (Obstetrics and Gynecology) Levy Sjogren, MD as Referring Physician (Dermatology) Marygrace Drought, MD as Consulting Physician (Ophthalmology) Chesley Mires, MD as Consulting Physician (Pulmonary Disease)    Assessment:   This is a routine wellness examination for Jacqueline Jennings.  Exercise Activities and Dietary recommendations Current Exercise Habits: The patient does not participate in regular exercise at present, Exercise limited by: orthopedic condition(s)  Goals    . Weight (lb) < 200 lb (90.7 kg)     Keep moving forward and living life; Enjoying your friends  The next accomplishment       Fall Risk Fall Risk  04/30/2019 02/17/2018 02/03/2015  Falls in the past year? 0 No No  Risk for fall due to :  Medication side effect - -  Follow up Falls evaluation completed;Education provided;Falls prevention discussed - -   Is the patient's home free of loose throw rugs in walkways, pet beds, electrical cords, etc?   yes      Grab bars in the bathroom? yes      Handrails on the stairs?   yes      Adequate lighting?   yes  Timed Get Up and Go performed: N/A due to telephone visit.  Depression Screen PHQ 2/9 Scores 04/30/2019 02/22/2019 02/17/2018 02/03/2015  PHQ - 2 Score 0 0 0 0     Cognitive Function     6CIT Screen 04/30/2019  What Year? 0 points  What month? 0 points  What time? 0 points  Count back from 20 0 points  Months in reverse 0 points  Repeat phrase 0 points  Total Score 0    Immunization History  Administered Date(s) Administered  . Fluad Quad(high Dose 65+) 02/22/2019  . Influenza Split 01/14/2011, 02/20/2012  . Influenza Whole 01/21/2007, 01/18/2008, 01/17/2009  . Influenza, High Dose Seasonal PF 02/14/2017, 02/17/2018  . Influenza,inj,Quad PF,6+ Mos 02/08/2013, 02/17/2014, 12/14/2014, 12/19/2015  . Influenza,inj,quad, With Preservative 01/28/2018  . Pneumococcal Conjugate-13 02/14/2017  . Pneumococcal Polysaccharide-23 02/17/2018  . Td 04/22/2005  . Tdap 02/22/2016  . Zoster 02/20/2012    Qualifies for Shingles Vaccine?Yes; she plans to obtain after covid vaccines are completed.  Screening Tests Health Maintenance  Topic Date Due  . MAMMOGRAM  11/24/2019  . COLONOSCOPY  11/01/2022  . TETANUS/TDAP  02/21/2026  . INFLUENZA VACCINE  Completed  . DEXA SCAN  Completed  . PNA vac Low Risk Adult  Completed  . Hepatitis C Screening  Addressed    Cancer Screenings: Lung: Low Dose CT Chest recommended if Age 45-80 years, 30 pack-year currently smoking OR have quit w/in 15years. Patient does not qualify. Breast:  Up to date on Mammogram? Yes   Up to date of Bone Density/Dexa? No;  Order sent today. Colorectal: Yes  Additional Screenings:  Hepatitis C  Screening: completed 02/20/2015.     Plan:   Ms. Silvas plans to  obtain covid vaccine as soon as she is eligible. She will then completed shingrix vaccines. Order was sent today for DEXA. All other preventative health is up to date at this time.    I have personally reviewed and noted the following in the patient's chart:   . Medical and social history . Use of alcohol, tobacco or illicit drugs  . Current medications and supplements . Functional ability and status . Nutritional status . Physical activity . Advanced directives . List of other physicians . Hospitalizations, surgeries, and ER visits in previous 12 months . Vitals . Screenings to include cognitive, depression, and falls . Referrals and appointments  In addition, I have reviewed and discussed with patient certain preventive protocols, quality metrics, and best practice recommendations. A written personalized care plan for preventive services as well as general preventive health recommendations were provided to patient.     Franne Forts, LPN  [id number]

## 2019-04-30 NOTE — Patient Instructions (Addendum)
Jacqueline Jennings , Thank you for taking time to participate in your Medicare Wellness Visit. I appreciate your ongoing commitment to your health goals. Please review the following plan we discussed and let me know if I can assist you in the future.   Screening recommendations/referrals: Colorectal Screening: coloscopy on 10/31/2017; due again 11/01/2022 Mammogram: done 11/24/2018. Currently up to date Bone Density: last one on 02/14/2017; due now; order sent to [facility]. They will call you to schedule.  Vision and Dental Exams: Recommended annual ophthalmology exams for early detection of glaucoma and other disorders of the eye Recommended annual dental exams for proper oral hygiene  Diabetic Exams: Diabetic Eye Exam: N/A Diabetic Foot Exam: N/A  Vaccinations: Influenza vaccine: completed 02/22/2019. Due Fall 2021. Pneumococcal vaccine: completed 02/14/2017 & 02/17/2018. Tdap vaccine: completed 02/22/2016; due 02/21/2026. Shingles vaccine: Please obtain at your pharmacy after you completed the covid vaccine. Contact us when you are ready and we can send an order to your pharmacy. Advanced directives: Advance directives discussed with you today.  Please bring a copy of your POA (Power of Attorney) and/or Living Will to our office after you make your changes.  Goals: Recommend to drink at least 6-8 8oz glasses of water per day.  Recommend to exercise for at least 150 minutes per week.  Recommend to remove any items from the home that may cause slips or trips.  Next appointment: Please schedule your Annual Wellness Visit with your Nurse Health Advisor in one year.  Preventive Care 68 Years and Older, Female Preventive care refers to lifestyle choices and visits with your health care provider that can promote health and wellness. What does preventive care include?  A yearly physical exam. This is also called an annual well check.  Dental exams once or twice a  year.  Routine eye exams. Ask your health care provider how often you should have your eyes checked.  Personal lifestyle choices, including:  Daily care of your teeth and gums.  Regular physical activity.  Eating a healthy diet.  Avoiding tobacco and drug use.  Limiting alcohol use.  Practicing safe sex.  Taking low-dose aspirin every day if recommended by your health care provider.  Taking vitamin and mineral supplements as recommended by your health care provider. What happens during an annual well check? The services and screenings done by your health care provider during your annual well check will depend on your age, overall health, lifestyle risk factors, and family history of disease. Counseling  Your health care provider may ask you questions about your:  Alcohol use.  Tobacco use.  Drug use.  Emotional well-being.  Home and relationship well-being.  Sexual activity.  Eating habits.  History of falls.  Memory and ability to understand (cognition).  Work and work Statistician.  Reproductive health. Screening  You may have the following tests or measurements:  Height, weight, and BMI.  Blood pressure.  Lipid and cholesterol levels. These may be checked every 5 years, or more frequently if you are over 53 years old.  Skin check.  Lung cancer screening. You may have this screening every year starting at age 80 if you have a 30-pack-year history of smoking and currently smoke or have quit within the past 15 years.  Fecal occult blood test (FOBT) of the stool. You may have this test every year starting at age 9.  Flexible sigmoidoscopy or colonoscopy. You may have a sigmoidoscopy every 5 years or a colonoscopy every 10 years starting at  age 60.  Hepatitis C blood test.  Hepatitis B blood test.  Sexually transmitted disease (STD) testing.  Diabetes screening. This is done by checking your blood sugar (glucose) after you have not eaten for a while  (fasting). You may have this done every 1-3 years.  Bone density scan. This is done to screen for osteoporosis. You may have this done starting at age 18.  Mammogram. This may be done every 1-2 years. Talk to your health care provider about how often you should have regular mammograms. Talk with your health care provider about your test results, treatment options, and if necessary, the need for more tests. Vaccines  Your health care provider may recommend certain vaccines, such as:  Influenza vaccine. This is recommended every year.  Tetanus, diphtheria, and acellular pertussis (Tdap, Td) vaccine. You may need a Td booster every 10 years.  Zoster vaccine. You may need this after age 35.  Pneumococcal 13-valent conjugate (PCV13) vaccine. One dose is recommended after age 23.  Pneumococcal polysaccharide (PPSV23) vaccine. One dose is recommended after age 59. Talk to your health care provider about which screenings and vaccines you need and how often you need them. This information is not intended to replace advice given to you by your health care provider. Make sure you discuss any questions you have with your health care provider. Document Released: 05/05/2015 Document Revised: 12/27/2015 Document Reviewed: 02/07/2015 Elsevier Interactive Patient Education  2017 [employer] Prevention in the Home Falls can cause injuries. They can happen to people of all ages. There are many things you can do to make your home safe and to help prevent falls. What can I do on the outside of my home?  Regularly fix the edges of walkways and driveways and fix any cracks.  Remove anything that might make you trip as you walk through a door, such as a raised step or threshold.  Trim any bushes or trees on the path to your home.  Use bright outdoor lighting.  Clear any walking paths of anything that might make someone trip, such as rocks or tools.  Regularly check to see if handrails are loose  or broken. Make sure that both sides of any steps have handrails.  Any raised decks and porches should have guardrails on the edges.  Have any leaves, snow, or ice cleared regularly.  Use sand or salt on walking paths during winter.  Clean up any spills in your garage right away. This includes oil or grease spills. What can I do in the bathroom?  Use night lights.  Install grab bars by the toilet and in the tub and shower. Do not use towel bars as grab bars.  Use non-skid mats or decals in the tub or shower.  If you need to sit down in the shower, use a plastic, non-slip stool.  Keep the floor dry. Clean up any water that spills on the floor as soon as it happens.  Remove soap buildup in the tub or shower regularly.  Attach bath mats securely with double-sided non-slip rug tape.  Do not have throw rugs and other things on the floor that can make you trip. What can I do in the bedroom?  Use night lights.  Make sure that you have a light by your bed that is easy to reach.  Do not use any sheets or blankets that are too big for your bed. They should not hang down onto the floor.  Have a firm chair  that has side arms. You can use this for support while you get dressed.  Do not have throw rugs and other things on the floor that can make you trip. What can I do in the kitchen?  Clean up any spills right away.  Avoid walking on wet floors.  Keep items that you use a lot in easy-to-reach places.  If you need to reach something above you, use a strong step stool that has a grab bar.  Keep electrical cords out of the way.  Do not use floor polish or wax that makes floors slippery. If you must use wax, use non-skid floor wax.  Do not have throw rugs and other things on the floor that can make you trip. What can I do with my stairs?  Do not leave any items on the stairs.  Make sure that there are handrails on both sides of the stairs and use them. Fix handrails that are  broken or loose. Make sure that handrails are as long as the stairways.  Check any carpeting to make sure that it is firmly attached to the stairs. Fix any carpet that is loose or worn.  Avoid having throw rugs at the top or bottom of the stairs. If you do have throw rugs, attach them to the floor with carpet tape.  Make sure that you have a light switch at the top of the stairs and the bottom of the stairs. If you do not have them, ask someone to add them for you. What else can I do to help prevent falls?  Wear shoes that:  Do not have high heels.  Have rubber bottoms.  Are comfortable and fit you well.  Are closed at the toe. Do not wear sandals.  If you use a stepladder:  Make sure that it is fully opened. Do not climb a closed stepladder.  Make sure that both sides of the stepladder are locked into place.  Ask someone to hold it for you, if possible.  Clearly mark and make sure that you can see:  Any grab bars or handrails.  First and last steps.  Where the edge of each step is.  Use tools that help you move around (mobility aids) if they are needed. These include:  Canes.  Walkers.  Scooters.  Crutches.  Turn on the lights when you go into a dark area. Replace any light bulbs as soon as they burn out.  Set up your furniture so you have a clear path. Avoid moving your furniture around.  If any of your floors are uneven, fix them.  If there are any pets around you, be aware of where they are.  Review your medicines with your doctor. Some medicines can make you feel dizzy. This can increase your chance of falling. Ask your doctor what other things that you can do to help prevent falls. This information is not intended to replace advice given to you by your health care provider. Make sure you discuss any questions you have with your health care provider. Document Released: 02/02/2009 Document Revised: 09/14/2015 Document Reviewed: 05/13/2014 Elsevier  Interactive Patient Education  2017 [employer].

## 2019-05-17 DIAGNOSIS — H40013 Open angle with borderline findings, low risk, bilateral: Secondary | ICD-10-CM | POA: Diagnosis not present

## 2019-05-17 DIAGNOSIS — H04123 Dry eye syndrome of bilateral lacrimal glands: Secondary | ICD-10-CM | POA: Diagnosis not present

## 2019-05-20 ENCOUNTER — Ambulatory Visit: Payer: Medicare Other

## 2019-05-29 ENCOUNTER — Ambulatory Visit: Payer: Medicare Other | Attending: Internal Medicine

## 2019-05-29 DIAGNOSIS — Z23 Encounter for immunization: Secondary | ICD-10-CM

## 2019-05-29 NOTE — Progress Notes (Signed)
   Covid-19 Vaccination Clinic  Name:  Jacqueline Jennings    MRN: [id number] DOB: Jan 12, 1951  05/29/2019  Jacqueline Jennings was observed post Covid-19 immunization for 15 minutes without incidence. She was provided with Vaccine Information Sheet and instruction to access the V-Safe system.   Jacqueline Jennings was instructed to call 911 with any severe reactions post vaccine: Marland Kitchen Difficulty breathing  . Swelling of your face and throat  . A fast heartbeat  . A bad rash all over your body  . Dizziness and weakness    Immunizations Administered    Name Date Dose VIS Date Route   Pfizer COVID-19 Vaccine 05/29/2019 12:16 PM 0.3 mL 04/02/2019 Intramuscular   Manufacturer: [place]   Lot: [id number]   [place]: [id number]

## 2019-06-06 ENCOUNTER — Ambulatory Visit: Payer: Medicare Other

## 2019-06-14 DIAGNOSIS — H10413 Chronic giant papillary conjunctivitis, bilateral: Secondary | ICD-10-CM | POA: Diagnosis not present

## 2019-06-23 ENCOUNTER — Ambulatory Visit: Payer: Medicare Other | Attending: Internal Medicine

## 2019-06-23 DIAGNOSIS — Z23 Encounter for immunization: Secondary | ICD-10-CM | POA: Insufficient documentation

## 2019-06-23 NOTE — Progress Notes (Signed)
   Covid-19 Vaccination Clinic  Name:  Jacqueline Jennings    MRN: [id number] DOB: 1950-09-03  06/23/2019  [name] was observed post Covid-19 immunization for 15 minutes without incident. She was provided with Vaccine Information Sheet and instruction to access the V-Safe system.   [name] was instructed to call 911 with any severe reactions post vaccine: Marland Kitchen Difficulty breathing  . Swelling of face and throat  . A fast heartbeat  . A bad rash all over body  . Dizziness and weakness   Immunizations Administered    Name Date Dose VIS Date Route   Pfizer COVID-19 Vaccine 06/23/2019 10:22 AM 0.3 mL 04/02/2019 Intramuscular   Manufacturer: [place]   Lot: [id number]   [place]: [id number]

## 2019-11-15 DIAGNOSIS — H43813 Vitreous degeneration, bilateral: Secondary | ICD-10-CM | POA: Diagnosis not present

## 2019-11-15 DIAGNOSIS — H524 Presbyopia: Secondary | ICD-10-CM | POA: Diagnosis not present

## 2019-11-15 DIAGNOSIS — H40013 Open angle with borderline findings, low risk, bilateral: Secondary | ICD-10-CM | POA: Diagnosis not present

## 2019-11-15 DIAGNOSIS — Z961 Presence of intraocular lens: Secondary | ICD-10-CM | POA: Diagnosis not present

## 2019-11-24 ENCOUNTER — Encounter: Payer: Self-pay | Admitting: Family Medicine

## 2019-11-24 ENCOUNTER — Ambulatory Visit (INDEPENDENT_AMBULATORY_CARE_PROVIDER_SITE_OTHER): Payer: Medicare Other | Admitting: Family Medicine

## 2019-11-24 ENCOUNTER — Other Ambulatory Visit: Payer: Self-pay

## 2019-11-24 VITALS — BP 120/76 | HR 80 | Temp 98.6°F | Wt 214.6 lb

## 2019-11-24 DIAGNOSIS — R202 Paresthesia of skin: Secondary | ICD-10-CM | POA: Diagnosis not present

## 2019-11-24 NOTE — Patient Instructions (Signed)
Paresthesia Paresthesia is an abnormal burning or prickling sensation. It is usually felt in the hands, arms, legs, or feet. However, it may occur in any part of the body. Usually, paresthesia is not painful. It may feel like:  Tingling or numbness.  Buzzing.  Itching. Paresthesia may occur without any clear cause, or it may be caused by:  Breathing too quickly (hyperventilation).  Pressure on a nerve.  An underlying medical condition.  Side effects of a medication.  Nutritional deficiencies.  Exposure to toxic chemicals. Most people experience temporary (transient) paresthesia at some time in their lives. For some people, it may be long-lasting (chronic) because of an underlying medical condition. If you have paresthesia that lasts a long time, you may need to be evaluated by your health care provider. Follow these instructions at home: Alcohol use   Do not drink alcohol if: ? Your health care provider tells you not to drink. ? You are pregnant, may be pregnant, or are planning to become pregnant.  If you drink alcohol: ? Limit how much you use to:  0-1 drink a day for women.  0-2 drinks a day for men. ? Be aware of how much alcohol is in your drink. In the [place] one drink equals one 12 oz bottle of beer (355 mL), one 5 oz glass of wine (148 mL), or one 1 oz glass of hard liquor (44 mL). Nutrition   Eat a healthy diet. This includes: ? Eating foods that are high in fiber, such as fresh fruits and vegetables, whole grains, and beans. ? Limiting foods that are high in fat and processed sugars, such as fried or sweet foods. General instructions  Take over-the-counter and prescription medicines only as told by your health care provider.  Do not use any products that contain nicotine or tobacco, such as cigarettes and e-cigarettes. These can keep blood from reaching damaged nerves. If you need help quitting, ask your health care provider.  If you have diabetes, work  closely with your health care provider to keep your blood sugar under control.  If you have numbness in your feet: ? Check every day for signs of injury or infection. Watch for redness, warmth, and swelling. ? Wear padded socks and comfortable shoes. These help protect your feet.  Keep all follow-up visits as told by your health care provider. This is important. Contact a health care provider if you:  Have paresthesia that gets worse or does not go away.  Have a burning or prickling feeling that gets worse when you walk.  Have pain, cramps, or dizziness.  Develop a rash. Get help right away if you:  Feel weak.  Have trouble walking or moving.  Have problems with speech, understanding, or vision.  Feel confused.  Cannot control your bladder or bowel movements.  Have numbness after an injury.  Develop new weakness in an arm or leg.  Faint. Summary  Paresthesia is an abnormal burning or prickling sensation that is usually felt in the hands, arms, legs, or feet. It may also occur in other parts of the body.  Paresthesia may occur without any clear cause, or it may be caused by breathing too quickly (hyperventilation), pressure on a nerve, an underlying medical condition, side effects of a medication, nutritional deficiencies, or exposure to toxic chemicals.  If you have paresthesia that lasts a long time, you may need to be evaluated by your health care provider. This information is not intended to replace advice given to you by   your health care provider. Make sure you discuss any questions you have with your health care provider. Document Revised: 05/04/2018 Document Reviewed: 04/17/2017 Elsevier Patient Education  2020 [place].  Restless Legs Syndrome Restless legs syndrome is a condition that causes uncomfortable feelings or sensations in the legs, especially while sitting or lying down. The sensations usually cause an overwhelming urge to move the legs. The arms can  also sometimes be affected. The condition can range from mild to severe. The symptoms often interfere with a person's ability to sleep. What are the causes? The cause of this condition is not known. What increases the risk? The following factors may make you more likely to develop this condition:  Being older than 50.  Pregnancy.  Being a woman. In general, the condition is more common in women than in men.  A family history of the condition.  Having iron deficiency.  Overuse of caffeine, nicotine, or alcohol.  Certain medical conditions, such as kidney disease, Parkinson's disease, or nerve damage.  Certain medicines, such as those for high blood pressure, nausea, colds, allergies, depression, and some heart conditions. What are the signs or symptoms? The main symptom of this condition is uncomfortable sensations in the legs, such as:  Pulling.  Tingling.  Prickling.  Throbbing.  Crawling.  Burning. Usually, the sensations:  Affect both sides of the body.  Are worse when you sit or lie down.  Are worse at night. These may wake you up or make it difficult to fall asleep.  Make you have a strong urge to move your legs.  Are temporarily relieved by moving your legs. The arms can also be affected, but this is rare. People who have this condition often have tiredness during the day because of their lack of sleep at night. How is this diagnosed? This condition may be diagnosed based on:  Your symptoms.  Blood tests. In some cases, you may be monitored in a sleep lab by a specialist (a sleep study). This can detect any disruptions in your sleep. How is this treated? This condition is treated by managing the symptoms. This may include:  Lifestyle changes, such as exercising, using relaxation techniques, and avoiding caffeine, alcohol, or tobacco.  Medicines. Anti-seizure medicines may be tried first. Follow these instructions at home:     General  instructions  Take over-the-counter and prescription medicines only as told by your health care provider.  Use methods to help relieve the uncomfortable sensations, such as: ? Massaging your legs. ? Walking or stretching. ? Taking a cold or hot bath.  Keep all follow-up visits as told by your health care provider. This is important. Lifestyle  Practice good sleep habits. For example, go to bed and get up at the same time every day. Most adults should get 7-9 hours of sleep each night.  Exercise regularly. Try to get at least 30 minutes of exercise most days of the week.  Practice ways of relaxing, such as yoga or meditation.  Avoid caffeine and alcohol.  Do not use any products that contain nicotine or tobacco, such as cigarettes and e-cigarettes. If you need help quitting, ask your health care provider. Contact a health care provider if:  Your symptoms get worse or they do not improve with treatment. Summary  Restless legs syndrome is a condition that causes uncomfortable feelings or sensations in the legs, especially while sitting or lying down.  The symptoms often interfere with a person's ability to sleep.  This condition is treated  by managing the symptoms. You may need to make lifestyle changes or take medicines. This information is not intended to replace advice given to you by your health care provider. Make sure you discuss any questions you have with your health care provider. Document Revised: 04/28/2017 Document Reviewed: 04/28/2017 Elsevier Patient Education  [place].

## 2019-11-24 NOTE — Progress Notes (Signed)
Subjective:    Patient ID: Jacqueline Jennings, female    DOB: 04-06-1951, 69 y.o.   MRN: [id number]  No chief complaint on file.   HPI Pt is a 69 yo female with pmh sig for h/o breast cancer ,hypothyroidism, B12 def, vit D def, OSA, roseacea, followed by Dr. Ethlyn Gallery who was seen today for throbbing pain in b/l LEs x 3-4 wks.  Pt notes " a low level throbbing" sensation in LEs.  At times sensation wake pt up at night.  Has felt like something was crawling on her legs.  Pt does not have to move her legs for relief and denies muscle cramping, edema, joint pain other than chronic left knee pain 2/2 history of torn meniscus.  Has f/u with Dr. Elmyra Ricks, Ortho for knee injection.  Endorses similar sensation in hands.  No symptoms in the last 3-4 days.  Pt exercises regularly, cooks her meals.  Taking Ibuprofen 200 mg BID for the discomfort.  Tried tylenol arthritis strength, but the ibuprofen works better.  Taking B12 twice daily for history of B12 deficiency. Also on Armour Thyroid for history of hypothyroidism.  Past Medical History:  Diagnosis Date  . Acute medial meniscal tear 10/02/2011   right  . Allergy    seasonal  . Arthritis 09-27-11   Right knee/Cortisone injection 2'13  . Breast cancer ([place]) 2006   hx of with rediation, surgery and adjuvant theapy with tamoxifen  (year two)  . Colon polyps 2014  . Hyperlipidemia   . Hypothyroidism 09-27-11   Low function  . IBS (irritable bowel syndrome)   . Irritable bowel syndrome 05/19/2007   Qualifier: Diagnosis of  By: Arnoldo Morale MD, [place], HX OF 11/26/2006   Qualifier: Diagnosis of  By: Jimmye Norman, LPN, [place] WHETHER GEN/LOC [facility] SITE 02/28/2009   Qualifier: Diagnosis of  By: Arnoldo Morale MD, Balinda Quails   . Sleep apnea 09-27-11   Cpap used ,started 0'0923    Allergies  Allergen Reactions  . Adhesive [Tape]   . Augmentin [Amoxicillin-Pot Clavulanate] Rash    Drug eruption  . Latex Rash    Adhesives  products-causes reddness, rash  . Nabumetone     REACTION: Rash,Hives    ROS General: Denies fever, chills, night sweats, changes in weight, changes in appetite HEENT: Denies headaches, ear pain, changes in vision, rhinorrhea, sore throat CV: Denies CP, palpitations, SOB, orthopnea Pulm: Denies SOB, cough, wheezing GI: Denies abdominal pain, nausea, vomiting, diarrhea, constipation GU: Denies dysuria, hematuria, frequency, vaginal discharge Msk: Denies muscle cramps, joint pains Neuro: Denies weakness, numbness, tingling  +throbbing sensation in b/l LEs and hands, paresthesias Skin: Denies rashes, bruising Psych: Denies depression, anxiety, hallucinations      Objective:    Blood pressure 120/76, pulse 80, temperature 98.6 F (37 C), temperature source Oral, weight 214 lb 9.6 oz (97.3 kg), SpO2 99 %.  Gen. Pleasant, well-nourished, in no distress, normal affect   HEENT: [place]/AT, face symmetric, no scleral icterus, PERRLA, EOMI, nares patent without drainage Lungs: no accessory muscle use Cardiovascular: RRR, no peripheral edema Musculoskeletal: negative Homans' sign, calf tenderness, lower extremity edema. No deformities, no cyanosis or clubbing, normal tone Neuro:  A&Ox3, CN II-XII intact, normal gait.  Decreased sensation to soft and fine touch on b/l LEs to knee, LLE worse than RLE. Skin:  Warm, no lesions/ rash   Wt Readings from Last 3 Encounters:  04/30/19 215 lb (97.5 kg)  03/11/19 217 lb (  98.4 kg)  02/22/19 215 lb 11.2 oz (97.8 kg)    Lab Results  Component Value Date   WBC 6.3 09/14/2014   HGB 14.1 09/14/2014   HCT 42.2 09/14/2014   PLT 320.0 09/14/2014   GLUCOSE 96 02/15/2019   CHOL 197 02/15/2019   TRIG 115.0 02/15/2019   HDL 51.80 02/15/2019   LDLCALC 122 (H) 02/15/2019   ALT 14 02/15/2019   AST 15 02/15/2019   NA 141 02/15/2019   K 4.4 02/15/2019   CL 105 02/15/2019   CREATININE 0.75 02/15/2019   BUN 15 02/15/2019   CO2 27 02/15/2019   TSH 3.18  02/15/2019   HGBA1C 5.8 02/17/2018    Assessment/Plan:  Paresthesia of both lower extremities  -chronic issue -discussed possible causes including vitamin deficiency, electrolyte deficiency,  RLS.  Consider autoimmune d/o. -will obtain labs -Given handouts - Plan: CBC (no diff), CMP with eGFR([facility]), TSH, Vitamin B12, Folate, Magnesium, D-dimer, Quantitative  F/u prn with pcp  Grier Mitts, MD

## 2019-11-25 LAB — COMPLETE METABOLIC PANEL WITH GFR
AG Ratio: 2 (calc) (ref 1.0–2.5)
ALT: 14 U/L (ref 6–29)
AST: 16 U/L (ref 10–35)
Albumin: 4.1 g/dL (ref 3.6–5.1)
Alkaline phosphatase (APISO): 61 U/L (ref 37–153)
BUN: 15 mg/dL (ref 7–25)
CO2: 26 mmol/L (ref 20–32)
Calcium: 9.3 mg/dL (ref 8.6–10.4)
Chloride: 107 mmol/L (ref 98–110)
Creat: 0.67 mg/dL (ref 0.50–0.99)
GFR, Est African American: 105 mL/min/{1.73_m2} (ref >=60)
GFR, Est Non African American: 90 mL/min/{1.73_m2} (ref >=60)
Globulin: 2.1 g/dL (calc) (ref 1.9–3.7)
Glucose, Bld: 127 mg/dL — ABNORMAL HIGH (ref 65–99)
Potassium: 4.4 mmol/L (ref 3.5–5.3)
Sodium: 139 mmol/L (ref 135–146)
Total Bilirubin: 0.4 mg/dL (ref 0.2–1.2)
Total Protein: 6.2 g/dL (ref 6.1–8.1)

## 2019-11-25 LAB — FOLATE: Folate: 10.8 ng/mL

## 2019-11-25 LAB — CBC
HCT: 41.1 % (ref 35.0–45.0)
Hemoglobin: 13.7 g/dL (ref 11.7–15.5)
MCH: 28.7 pg (ref 27.0–33.0)
MCHC: 33.3 g/dL (ref 32.0–36.0)
MCV: 86.2 fL (ref 80.0–100.0)
MPV: 10 fL (ref 7.5–12.5)
Platelets: 350 10*3/uL (ref 140–400)
RBC: 4.77 10*6/uL (ref 3.80–5.10)
RDW: 12.8 % (ref 11.0–15.0)
WBC: 6.3 10*3/uL (ref 3.8–10.8)

## 2019-11-25 LAB — MAGNESIUM: Magnesium: 2 mg/dL (ref 1.5–2.5)

## 2019-11-25 LAB — VITAMIN B12: Vitamin B-12: 459 pg/mL (ref 200–1100)

## 2019-11-25 LAB — D-DIMER, QUANTITATIVE: D-Dimer, Quant: 0.4 mcg/mL FEU (ref <0.50)

## 2019-11-25 LAB — TSH: TSH: 2.17 mIU/L (ref 0.40–4.50)

## 2019-11-30 DIAGNOSIS — Z1231 Encounter for screening mammogram for malignant neoplasm of breast: Secondary | ICD-10-CM | POA: Diagnosis not present

## 2019-11-30 LAB — HM MAMMOGRAPHY

## 2019-12-02 ENCOUNTER — Encounter: Payer: Self-pay | Admitting: Family Medicine

## 2019-12-06 ENCOUNTER — Other Ambulatory Visit: Payer: Self-pay | Admitting: Family Medicine

## 2019-12-06 ENCOUNTER — Telehealth: Payer: Self-pay | Admitting: Family Medicine

## 2019-12-06 MED ORDER — SHINGRIX 50 MCG/0.5ML IM SUSR
0.5000 mL | Freq: Once | INTRAMUSCULAR | 0 refills | Status: AC
Start: 2019-12-06 — End: 2019-12-06

## 2019-12-06 NOTE — Telephone Encounter (Signed)
Order sent.

## 2019-12-06 NOTE — Telephone Encounter (Signed)
Patient informed of the message below.

## 2019-12-06 NOTE — Telephone Encounter (Signed)
The patient is needing a Rx sent to the pharmacy for the shingles injection.  [id number] [address], [place] Phone:  [phone]  Fax:  [phone]     Please advise

## 2019-12-07 ENCOUNTER — Encounter: Payer: Self-pay | Admitting: Family Medicine

## 2019-12-07 ENCOUNTER — Other Ambulatory Visit: Payer: Self-pay

## 2019-12-07 ENCOUNTER — Ambulatory Visit (INDEPENDENT_AMBULATORY_CARE_PROVIDER_SITE_OTHER)
Admission: RE | Admit: 2019-12-07 | Discharge: 2019-12-07 | Disposition: A | Discharge status: Home / Self Care | Payer: Medicare Other | Source: Ambulatory Visit | Attending: Family Medicine | Admitting: Family Medicine

## 2019-12-07 DIAGNOSIS — Z13820 Encounter for screening for osteoporosis: Secondary | ICD-10-CM | POA: Diagnosis not present

## 2019-12-07 DIAGNOSIS — Z780 Asymptomatic menopausal state: Secondary | ICD-10-CM | POA: Diagnosis not present

## 2019-12-08 DIAGNOSIS — M170 Bilateral primary osteoarthritis of knee: Secondary | ICD-10-CM | POA: Diagnosis not present

## 2019-12-14 ENCOUNTER — Ambulatory Visit (INDEPENDENT_AMBULATORY_CARE_PROVIDER_SITE_OTHER): Payer: Medicare Other | Admitting: Pulmonary Disease

## 2019-12-14 ENCOUNTER — Encounter: Payer: Self-pay | Admitting: Pulmonary Disease

## 2019-12-14 ENCOUNTER — Other Ambulatory Visit: Payer: Self-pay

## 2019-12-14 VITALS — BP 142/70 | HR 77 | Temp 97.1°F | Ht 66.0 in | Wt 212.6 lb

## 2019-12-14 DIAGNOSIS — G4733 Obstructive sleep apnea (adult) (pediatric): Secondary | ICD-10-CM | POA: Diagnosis not present

## 2019-12-14 DIAGNOSIS — Z9989 Dependence on other enabling machines and devices: Secondary | ICD-10-CM

## 2019-12-14 NOTE — Patient Instructions (Signed)
Will arrange for new auto CPAP machine through [facility]  Follow up in 3 months

## 2019-12-14 NOTE — Progress Notes (Signed)
[facility]  Chief Complaint  Patient presents with   Follow-up    using CPAP nightly     Constitutional:  BP (!) 142/70 (BP Location: Left Arm, Cuff Size: Normal)    Pulse 77    Temp (!) 97.1 F (36.2 C) (Temporal)    Ht 5\' 6"  (1.676 m)    Wt 212 lb 9.6 oz (96.4 kg)    SpO2 95%    BMI 34.31 kg/m   Past Medical History:  Breast cancer, Colon polyp, HLD, Hypothyroidism, IBS, Nephrolithiasis, OA  Past Surgical History:  Her  has a past surgical history that includes Tonsillectomy; Myomectomy (1990); Cystoscopy (1977); Cataract extraction (Bilateral, 2008); Breast biopsy (Right, 1977); Breast lumpectomy (Right, 2006); Knee arthroscopy (10/02/2011); left knee arthroscopy (2020); Dilation and curettage of uterus (2011); and Knee arthroscopy (Left, 05/2018).  Brief Summary:  Jacqueline Jennings is a 69 y.o. female with obstructive sleep apnea.     Subjective:  She uses CPAP nightly.  Has nasal pillow mask.  Not having sinus congestion, sore throat, dry mouth.  Has oral appliance that she uses intermittently when traveling.  Her CPAP is more than 68 yrs old.  Starting to make funny noises.  Physical Exam:   Appearance - well kempt   ENMT - no sinus tenderness, no oral exudate, no LAN, Mallampati 4 airway, no stridor  Respiratory - equal breath sounds bilaterally, no wheezing or rales  CV - s1s2 regular rate and rhythm, no murmurs  Ext - no clubbing, no edema  Skin - no rashes  Psych - normal mood and affect   Sleep Tests:   PSG10/13/10 >> AHI 11  Auto CPAP 09/15/19 to 12/13/19 >> used on 90 of 90 nights with average 8 hrs 12 min.  Average AHI 0.2 with median CPAP 11 and 95 th percentile CPAP 12 cm H2O  Social History:  She  reports that she has never smoked. She has never used smokeless tobacco. She reports current alcohol use. She reports that she does not use drugs.  Family History:  Her family history includes Diabetes in her  father; Hypertension in her mother; Leukemia in her mother; Uterine cancer in her maternal grandmother.         Assessment/Plan:  Obstructive sleep apnea. - she is compliant with CPAP - her current device is more than 68 yrs old, not functioning properly and not amenable to repair - will have [facility] arrange for a new auto CPAP range 5 to 20 cm H2O - discussed options to improve mask fit - she uses oral appliance intermittently when traveling  Time Spent Involved in Patient Care on Day of Examination:  22 minutes  Follow up:   Patient Instructions  Will arrange for new auto CPAP machine through [facility]  Follow up in 3 months   Medication List:   Allergies as of 12/14/2019      Reactions   Adhesive [tape]    Augmentin [amoxicillin-pot Clavulanate] Rash   Drug eruption   Latex Rash   Adhesives products-causes reddness, rash   Nabumetone    REACTION: Rash,Hives      Medication List       Accurate as of December 14, 2019 10:48 AM. If you have any questions, ask your nurse or doctor.        acetaminophen 650 MG CR tablet Commonly known as: TYLENOL Take 650 mg by mouth as needed for pain.   Armour Thyroid 60 MG tablet Generic  drug: thyroid Take 1 tablet (60 mg total) by mouth daily before breakfast.   fexofenadine 180 MG tablet Commonly known as: ALLEGRA Take 180 mg by mouth daily.   metroNIDAZOLE 0.75 % gel Commonly known as: METROGEL 2 (two) times daily. Apply to face bid   TURMERIC PO Take by mouth daily.   Vitamin B-12 5000 MCG Tbdp Take 2,500 mg by mouth 2 (two) times daily.   VITAMIN D-3 PO Take 1,000 Units by mouth daily.       Signature:  Chesley Mires, MD [place] Pager - [phone] 12/14/2019, 10:48 AM

## 2020-01-21 ENCOUNTER — Encounter: Payer: Self-pay | Admitting: Adult Health

## 2020-01-21 ENCOUNTER — Ambulatory Visit (INDEPENDENT_AMBULATORY_CARE_PROVIDER_SITE_OTHER): Payer: Medicare Other | Admitting: Adult Health

## 2020-01-21 ENCOUNTER — Telehealth: Payer: Self-pay | Admitting: Family Medicine

## 2020-01-21 DIAGNOSIS — N3000 Acute cystitis without hematuria: Secondary | ICD-10-CM | POA: Diagnosis not present

## 2020-01-21 MED ORDER — NITROFURANTOIN MONOHYD MACRO 100 MG PO CAPS: 100.0000 mg | ORAL_CAPSULE | Freq: Two times a day (BID) | ORAL | 0 refills | Status: DC

## 2020-01-21 NOTE — Telephone Encounter (Signed)
The patient called needing medication sent in for a UTI. I scheduled her for a virtual visit with Dorothyann Peng today.  Nothing further needed

## 2020-01-21 NOTE — Progress Notes (Signed)
Virtual Visit via Telephone Note  I connected with Jacqueline Jennings on 01/21/20 at 11:30 AM EDT by telephone and verified that I am speaking with the correct person using two identifiers.   I discussed the limitations, risks, security and privacy concerns of performing an evaluation and management service by telephone and the availability of in person appointments. I also discussed with the patient that there may be a patient responsible charge related to this service. The patient expressed understanding and agreed to proceed.  Location patient: home Location provider: work or home office Participants present for the call: patient, provider Patient did not have a visit in the prior 7 days to address this/these issue(s).   History of Present Illness: 69 year old female who is being evaluated today for an acute issue.  She reports that her symptoms started this morning.  Symptoms include urinary frequency, urgency, and significant dysuria.  She has not noticed any hematuria, had any fevers or chills, back pain, or abdominal pain.  She is leaving tomorrow to go on vacation.  Reports that she gets a UTI once or twice a year with similar symptoms.   Observations/Objective: Patient sounds cheerful and well on the phone. I do not appreciate any SOB. Speech and thought processing are grossly intact. Patient reported vitals:  Assessment and Plan: 1. Acute cystitis without hematuria - Will treat for UTI d/t symptoms.  - Stay hydrated - Follow up if no improvement or resoultion  - nitrofurantoin, macrocrystal-monohydrate, (MACROBID) 100 MG capsule; Take 1 capsule (100 mg total) by mouth 2 (two) times daily.  Dispense: 10 capsule; Refill: 0     Follow Up Instructions:   I did not refer this patient for an OV in the next 24 hours for this/these issue(s).  I discussed the assessment and treatment plan with the patient. The patient was provided an opportunity to ask questions and all were  answered. The patient agreed with the plan and demonstrated an understanding of the instructions.   The patient was advised to call back or seek an in-person evaluation if the symptoms worsen or if the condition fails to improve as anticipated.  I provided 8 minutes of non-face-to-face time during this encounter.   Dorothyann Peng, NP

## 2020-02-05 ENCOUNTER — Ambulatory Visit: Payer: Medicare Other | Attending: Internal Medicine

## 2020-02-05 DIAGNOSIS — Z23 Encounter for immunization: Secondary | ICD-10-CM

## 2020-02-05 NOTE — Progress Notes (Signed)
   Covid-19 Vaccination Clinic  Name:  Jacqueline Jennings    MRN: [id number] DOB: 03/15/1951  02/05/2020  [name] was observed post Covid-19 immunization for 15 minutes without incident. She was provided with Vaccine Information Sheet and instruction to access the V-Safe system.   [name] was instructed to call 911 with any severe reactions post vaccine: Marland Kitchen Difficulty breathing  . Swelling of face and throat  . A fast heartbeat  . A bad rash all over body  . Dizziness and weakness

## 2020-02-11 ENCOUNTER — Other Ambulatory Visit: Payer: Self-pay | Admitting: Family Medicine

## 2020-02-11 ENCOUNTER — Telehealth: Payer: Self-pay | Admitting: Family Medicine

## 2020-02-11 DIAGNOSIS — R7301 Impaired fasting glucose: Secondary | ICD-10-CM

## 2020-02-11 DIAGNOSIS — E039 Hypothyroidism, unspecified: Secondary | ICD-10-CM

## 2020-02-11 DIAGNOSIS — E785 Hyperlipidemia, unspecified: Secondary | ICD-10-CM

## 2020-02-11 NOTE — Telephone Encounter (Signed)
I ordered labs. I would suggest 2 weeks between shots. That is ample time.

## 2020-02-11 NOTE — Telephone Encounter (Signed)
Noted in lab visit that flu shot cannot be given prior to 10.30.21 per PCP which will be 2-weeks-from Covid shot/she has a visit on 11.03.21 which she can get the flu shot at that time/future orders put in as clinic collect so I have reordered as lab collect/thx dmf

## 2020-02-11 NOTE — Telephone Encounter (Signed)
JK-Pt is scheduled for a lab visit on 10.25.21 and then an OV with you on 11.3.21/She just had labs completed in August/can you look and see what else she may need and I would be happy to put the future orders in for you?  Also; it says flu shot but I see that she just had a covid shot on 10.16.21 is it ok that she gets the flu shot this soon after? Plz advise/thx dmf

## 2020-02-14 ENCOUNTER — Other Ambulatory Visit: Payer: Self-pay

## 2020-02-14 ENCOUNTER — Other Ambulatory Visit (INDEPENDENT_AMBULATORY_CARE_PROVIDER_SITE_OTHER): Payer: Medicare Other

## 2020-02-14 DIAGNOSIS — E785 Hyperlipidemia, unspecified: Secondary | ICD-10-CM

## 2020-02-15 LAB — LIPID PANEL
Cholesterol: 203 mg/dL — ABNORMAL HIGH (ref <200)
HDL: 55 mg/dL (ref >=50)
LDL Cholesterol (Calc): 130 mg/dL (calc) — ABNORMAL HIGH
Non-HDL Cholesterol (Calc): 148 mg/dL (calc) — ABNORMAL HIGH (ref <130)
Total CHOL/HDL Ratio: 3.7 (calc) (ref <5.0)
Triglycerides: 81 mg/dL (ref <150)

## 2020-02-15 LAB — HEMOGLOBIN A1C
Hgb A1c MFr Bld: 5.7 % of total Hgb — ABNORMAL HIGH (ref <5.7)
Mean Plasma Glucose: 117 (calc)
eAG (mmol/L): 6.5 (calc)

## 2020-02-23 ENCOUNTER — Encounter: Payer: Self-pay | Admitting: Family Medicine

## 2020-02-23 ENCOUNTER — Other Ambulatory Visit: Payer: Self-pay

## 2020-02-23 ENCOUNTER — Ambulatory Visit (INDEPENDENT_AMBULATORY_CARE_PROVIDER_SITE_OTHER): Payer: Medicare Other | Admitting: Family Medicine

## 2020-02-23 VITALS — BP 100/80 | HR 78 | Temp 98.2°F | Ht 66.0 in | Wt 214.5 lb

## 2020-02-23 DIAGNOSIS — Z853 Personal history of malignant neoplasm of breast: Secondary | ICD-10-CM

## 2020-02-23 DIAGNOSIS — E785 Hyperlipidemia, unspecified: Secondary | ICD-10-CM | POA: Diagnosis not present

## 2020-02-23 DIAGNOSIS — E559 Vitamin D deficiency, unspecified: Secondary | ICD-10-CM

## 2020-02-23 DIAGNOSIS — E538 Deficiency of other specified B group vitamins: Secondary | ICD-10-CM | POA: Diagnosis not present

## 2020-02-23 DIAGNOSIS — E039 Hypothyroidism, unspecified: Secondary | ICD-10-CM | POA: Diagnosis not present

## 2020-02-23 DIAGNOSIS — G4733 Obstructive sleep apnea (adult) (pediatric): Secondary | ICD-10-CM

## 2020-02-23 DIAGNOSIS — Z23 Encounter for immunization: Secondary | ICD-10-CM | POA: Diagnosis not present

## 2020-02-23 NOTE — Progress Notes (Signed)
Jacqueline Jennings DOB: Mar 28, 1951 Encounter date: 02/23/2020  This is a 69 y.o. female who presents with Chief Complaint  Patient presents with   Follow-up    History of present illness: Struggling with leg/knee issue. She is on schedule to see doc for foot on Friday and planning on getting viscous joint injections for knee.   We did review labs together.   HQP:RFFMBWG with sleep specialist; uses machine nightly. Does well with machine and she is good about using it nightly. Also has dental appliance which she uses just when she travels which works well for her.Couldn't even get through on phone to make appointment.   Hypothyroid:nature-thyroid 65mg  daily. Follows with Dr. Cruzita Lederer.  Hyperlipidemia:diet controlled.   Released from specialty care. Does still see g[facility]- [facility]. Has appointment this month with her.   B12 def: 2542mcg daily with this; last bloodwork in August was stable.   Vitamin D def: 1000 units daily supplement  Seasonal allergies: allegra- Doing well with this now. Controls symptoms.   Still sees derm; Dr. Rozann Lesches.   Had UTI back in October; doing better after treatment.   Shingles was sent to pharmacy and she hasn't had this yet; waiting to complete her booster for COVID first.   Allergies  Allergen Reactions   Adhesive [Tape]    Augmentin [Amoxicillin-Pot Clavulanate] Rash    Drug eruption   Latex Rash    Adhesives products-causes reddness, rash   Nabumetone     REACTION: Rash,Hives   Current Meds  Medication Sig   ibuprofen (ADVIL) 200 MG tablet Take 200 mg by mouth as needed.   Current Facility-Administered Medications for the 02/23/20 encounter (Office Visit) with Caren Macadam, MD  Medication   0.9 %  sodium chloride infusion    Review of Systems  Constitutional: Negative for chills, fatigue and fever.  Respiratory: Negative for cough, chest tightness, shortness of breath and wheezing.    Cardiovascular: Negative for chest pain, palpitations and leg swelling.  Neurological: Negative for dizziness and headaches.    Objective:  BP 100/80 (BP Location: Left Arm, Patient Position: Sitting, Cuff Size: Large)    Pulse 78    Temp 98.2 F (36.8 C) (Oral)    Ht 5\' 6"  (1.676 m)    Wt 214 lb 8 oz (97.3 kg)    BMI 34.62 kg/m   Weight: 214 lb 8 oz (97.3 kg)   BP Readings from Last 3 Encounters:  02/23/20 100/80  12/14/19 (!) 142/70  11/24/19 120/76   Wt Readings from Last 3 Encounters:  02/23/20 214 lb 8 oz (97.3 kg)  12/14/19 212 lb 9.6 oz (96.4 kg)  11/24/19 214 lb 9.6 oz (97.3 kg)    Physical Exam Constitutional:      General: She is not in acute distress.    Appearance: She is well-developed.  Cardiovascular:     Rate and Rhythm: Normal rate and regular rhythm.     Heart sounds: Normal heart sounds. No murmur heard.  No friction rub.  Pulmonary:     Effort: Pulmonary effort is normal. No respiratory distress.     Breath sounds: Normal breath sounds. No wheezing or rales.  Musculoskeletal:     Right lower leg: No edema.     Left lower leg: No edema.  Neurological:     Mental Status: She is alert and oriented to person, place, and time.  Psychiatric:        Behavior: Behavior normal.     Assessment/Plan  1. Need for immunization against influenza - Flu Vaccine QUAD High Dose(Fluad)  2. Obstructive sleep apnea Continues a CPAP.  She does well with this.  3. Acquired hypothyroidism Follows with endocrinology.  She is on Armour Thyroid 60 mg daily.  Continue her medication.  4. Hyperlipidemia, unspecified hyperlipidemia type This is been diet controlled.  We will continue to monitor through blood work.  5. BREAST CANCER, HX OF She does follow-up regularly for mammograms.  Sees Dr.Grewal regularly.  6. B12 deficiency She is taking supplementation.  We will continue to monitor levels.  7. Vitamin D deficiency She is taking vitamin D supplementation.   We will continue to monitor levels.  Return in about 1 year (around 02/22/2021) for Chronic condition visit.    [name], MD

## 2020-02-25 ENCOUNTER — Other Ambulatory Visit: Payer: Self-pay

## 2020-02-25 ENCOUNTER — Ambulatory Visit (INDEPENDENT_AMBULATORY_CARE_PROVIDER_SITE_OTHER): Payer: Medicare Other

## 2020-02-25 ENCOUNTER — Ambulatory Visit (INDEPENDENT_AMBULATORY_CARE_PROVIDER_SITE_OTHER): Payer: Medicare Other | Admitting: Podiatry

## 2020-02-25 DIAGNOSIS — M778 Other enthesopathies, not elsewhere classified: Secondary | ICD-10-CM | POA: Diagnosis not present

## 2020-02-25 DIAGNOSIS — M779 Enthesopathy, unspecified: Secondary | ICD-10-CM

## 2020-02-25 DIAGNOSIS — S86012A Strain of left Achilles tendon, initial encounter: Secondary | ICD-10-CM | POA: Diagnosis not present

## 2020-02-25 DIAGNOSIS — M25572 Pain in left ankle and joints of left foot: Secondary | ICD-10-CM

## 2020-02-25 NOTE — Patient Instructions (Signed)
I have ordered a MRI. If you do not hear for them about scheduling within the next 1 week, or you have any questions please give us a call at [phone].   

## 2020-02-28 NOTE — Progress Notes (Signed)
Subjective:   Patient ID: Jacqueline Jennings, female   DOB: 69 y.o.   MRN: [id number]   HPI 69 year old female presents the office today for concerns of thickening, swelling along the Achilles tendon area which has been ongoing for the last couple of months.  She denies any recent injury or trauma but she has stiffness that she reports.  She states the area is tender.  She had a fracture of her ankle in the 1990s.  She had knee surgery in February 2020.  She states that deep tissue massage helps.  She has tried calf stretches.  She has no other concerns today.   Review of Systems  All other systems reviewed and are negative.  Past Medical History:  Diagnosis Date  . Acute medial meniscal tear 10/02/2011   right  . Allergy    seasonal  . Arthritis 09-27-11   Right knee/Cortisone injection 2'13  . Breast cancer ([address]) 2006   hx of with rediation, surgery and adjuvant theapy with tamoxifen  (year two)  . Colon polyps 2014  . Hyperlipidemia   . Hypothyroidism 09-27-11   Low function  . IBS (irritable bowel syndrome)   . Irritable bowel syndrome 05/19/2007   Qualifier: Diagnosis of  By: Arnoldo Morale MD, [place], HX OF 11/26/2006   Qualifier: Diagnosis of  By: Jimmye Norman, LPN, [place] WHETHER GEN/LOC [facility] SITE 02/28/2009   Qualifier: Diagnosis of  By: Arnoldo Morale MD, Balinda Quails Sleep apnea 09-27-11   Cpap used ,started 1'9509    Past Surgical History:  Procedure Laterality Date  . BREAST BIOPSY Right 1977  . BREAST LUMPECTOMY Right 2006   raditon 33 tx-right  . CATARACT EXTRACTION Bilateral 2008  . CYSTOSCOPY  1977  . DILATION AND CURETTAGE OF UTERUS  2011   done after thickening of uterine lining from tamoxifen  . KNEE ARTHROSCOPY  10/02/2011   Procedure: ARTHROSCOPY KNEE;  Surgeon: Gearlean Alf, MD;  Location: [facility];  Service: Orthopedics;  Laterality: Right;  with Debridement, right knee medial meniscus  . KNEE ARTHROSCOPY Left 05/2018  . left  knee arthroscopy  2020  . MYOMECTOMY  1990  . TONSILLECTOMY       Current Outpatient Medications:  .  acetaminophen (TYLENOL) 650 MG CR tablet, Take 650 mg by mouth as needed for pain., Disp: , Rfl:  .  ARMOUR THYROID 60 MG tablet, Take 1 tablet (60 mg total) by mouth daily before breakfast., Disp: 90 tablet, Rfl: 3 .  Cholecalciferol (VITAMIN D-3 PO), Take 1,000 Units by mouth daily. , Disp: , Rfl:  .  Cyanocobalamin (VITAMIN B-12) 5000 MCG TBDP, Take 2,500 mg by mouth daily. , Disp: , Rfl:  .  Cyanocobalamin-Methylcobalamin 600-600 MCG SUBL, cyanocobalamin-methylcobalamin 600 mcg-600 mcg sublingual tablet  Place by sublingual route., Disp: , Rfl:  .  fexofenadine (ALLEGRA) 180 MG tablet, Take 180 mg by mouth daily., Disp: , Rfl:  .  fluocinonide cream (LIDEX) 0.05 %, fluocinonide 0.05 % topical cream  APPLY TO THE AFFECTED AREA(S) BY TOPICAL ROUTE 2 TIMES PER DAY, Disp: , Rfl:  .  ibuprofen (ADVIL) 200 MG tablet, Take 200 mg by mouth as needed., Disp: , Rfl:   Current Facility-Administered Medications:  .  0.9 %  sodium chloride infusion, 500 mL, Intravenous, Continuous, Ladene Artist, MD  Allergies  Allergen Reactions  . Adhesive [Tape]   . Amoxicillin   . Augmentin [Amoxicillin-Pot Clavulanate] Rash  Drug eruption  . Latex Rash    Adhesives products-causes reddness, rash  . Nabumetone     REACTION: Rash,Hives         Objective:  Physical Exam  General: AAO x3, NAD  Dermatological: Skin is warm, dry and supple bilateral. ent.  Vascular: Dorsalis Pedis artery and Posterior Tibial artery pedal pulses are 2/4 bilateral with immedate capillary fill time.  There is no pain with calf compression, swelling, warmth, erythema.   Neruologic: Grossly intact via light touch bilateral.  Negative Tinel sign.  Musculoskeletal: There is thickening present along the mid substance the Achilles tendon left side no palpable nodules present.  Thompson test is negative.  Decreased  medial arch upon weightbearing.  Decreased abduction ankle motion on the left side.  Ankle joint range of motion intact without crepitation.  Muscular strength 5/5 in all groups tested bilateral.  Gait: Unassisted, Nonantalgic.       Assessment:   Achilles tendinitis, rule out partial tear; arthritis     Plan:  -Treatment options discussed including all alternatives, risks, and complications -Etiology of symptoms were discussed -X-rays were obtained and reviewed with the patient.  Mild deformity present of ankle with arthritic changes present ankle, subtalar joints as well as the midtarsal joints.  There is no evidence of acute fracture.  Edema to the Achilles tendon area. -Regards to his Achilles tendon I recommend MRI to rule out partial tear.  This is for potential surgical planning.  If no significant tear will likely refer to physical therapy. -Also discussed options including supportive shoes, custom inserts.  Also discussed the x-ray findings with the arthritis.  Given the Achilles tendon issue could be coming from compensation of the way she is walking given the arthritis, flatfoot.  Trula Slade DPM

## 2020-03-07 ENCOUNTER — Other Ambulatory Visit: Payer: Self-pay | Admitting: Internal Medicine

## 2020-03-08 DIAGNOSIS — Z1283 Encounter for screening for malignant neoplasm of skin: Secondary | ICD-10-CM | POA: Diagnosis not present

## 2020-03-08 DIAGNOSIS — L821 Other seborrheic keratosis: Secondary | ICD-10-CM | POA: Diagnosis not present

## 2020-03-08 DIAGNOSIS — L304 Erythema intertrigo: Secondary | ICD-10-CM | POA: Diagnosis not present

## 2020-03-09 ENCOUNTER — Other Ambulatory Visit: Payer: Self-pay

## 2020-03-09 ENCOUNTER — Ambulatory Visit
Admission: RE | Admit: 2020-03-09 | Discharge: 2020-03-09 | Disposition: A | Discharge status: Home / Self Care | Payer: Medicare Other | Source: Ambulatory Visit | Attending: Podiatry | Admitting: Podiatry

## 2020-03-09 DIAGNOSIS — Q6689 Other  specified congenital deformities of feet: Secondary | ICD-10-CM | POA: Diagnosis not present

## 2020-03-09 DIAGNOSIS — M65872 Other synovitis and tenosynovitis, left ankle and foot: Secondary | ICD-10-CM | POA: Diagnosis not present

## 2020-03-09 DIAGNOSIS — S86012A Strain of left Achilles tendon, initial encounter: Secondary | ICD-10-CM

## 2020-03-09 DIAGNOSIS — M19072 Primary osteoarthritis, left ankle and foot: Secondary | ICD-10-CM | POA: Diagnosis not present

## 2020-03-10 DIAGNOSIS — Z124 Encounter for screening for malignant neoplasm of cervix: Secondary | ICD-10-CM | POA: Diagnosis not present

## 2020-03-10 DIAGNOSIS — Z779 Other contact with and (suspected) exposures hazardous to health: Secondary | ICD-10-CM | POA: Diagnosis not present

## 2020-03-10 DIAGNOSIS — Z6834 Body mass index (BMI) 34.0-34.9, adult: Secondary | ICD-10-CM | POA: Diagnosis not present

## 2020-03-15 ENCOUNTER — Other Ambulatory Visit: Payer: Self-pay | Admitting: Podiatry

## 2020-03-15 DIAGNOSIS — M7660 Achilles tendinitis, unspecified leg: Secondary | ICD-10-CM

## 2020-03-15 MED ORDER — IBUPROFEN 600 MG PO TABS: 600.0000 mg | ORAL_TABLET | Freq: Three times a day (TID) | ORAL | 0 refills | Status: DC | PRN

## 2020-03-21 ENCOUNTER — Encounter: Payer: Self-pay | Admitting: Pulmonary Disease

## 2020-03-21 ENCOUNTER — Encounter: Payer: Self-pay | Admitting: Internal Medicine

## 2020-03-21 ENCOUNTER — Ambulatory Visit (INDEPENDENT_AMBULATORY_CARE_PROVIDER_SITE_OTHER): Payer: Medicare Other | Admitting: Internal Medicine

## 2020-03-21 ENCOUNTER — Ambulatory Visit (INDEPENDENT_AMBULATORY_CARE_PROVIDER_SITE_OTHER): Payer: Medicare Other | Admitting: Pulmonary Disease

## 2020-03-21 ENCOUNTER — Other Ambulatory Visit: Payer: Self-pay

## 2020-03-21 VITALS — BP 130/88 | HR 77 | Ht 66.0 in | Wt 217.6 lb

## 2020-03-21 VITALS — BP 150/94 | HR 70 | Temp 97.2°F | Ht 66.0 in | Wt 216.0 lb

## 2020-03-21 DIAGNOSIS — M85851 Other specified disorders of bone density and structure, right thigh: Secondary | ICD-10-CM | POA: Diagnosis not present

## 2020-03-21 DIAGNOSIS — Z8639 Personal history of other endocrine, nutritional and metabolic disease: Secondary | ICD-10-CM | POA: Diagnosis not present

## 2020-03-21 DIAGNOSIS — E039 Hypothyroidism, unspecified: Secondary | ICD-10-CM

## 2020-03-21 DIAGNOSIS — Z9989 Dependence on other enabling machines and devices: Secondary | ICD-10-CM

## 2020-03-21 DIAGNOSIS — M85852 Other specified disorders of bone density and structure, left thigh: Secondary | ICD-10-CM

## 2020-03-21 DIAGNOSIS — L659 Nonscarring hair loss, unspecified: Secondary | ICD-10-CM | POA: Diagnosis not present

## 2020-03-21 DIAGNOSIS — G4733 Obstructive sleep apnea (adult) (pediatric): Secondary | ICD-10-CM

## 2020-03-21 DIAGNOSIS — R030 Elevated blood-pressure reading, without diagnosis of hypertension: Secondary | ICD-10-CM | POA: Diagnosis not present

## 2020-03-21 MED ORDER — THYROID 60 MG PO TABS
ORAL_TABLET | ORAL | 3 refills | Status: DC
Start: 2020-03-21 — End: 2021-03-27

## 2020-03-21 NOTE — Patient Instructions (Signed)
Follow up in 1 year.

## 2020-03-21 NOTE — Progress Notes (Signed)
[facility]  Chief Complaint  Patient presents with  . Follow-up    Cpap follow up    Constitutional:  BP (!) 150/94 (BP Location: Left Arm, Cuff Size: Normal)   Pulse 70   Temp (!) 97.2 F (36.2 C) (Other (Comment)) Comment (Src): wrist  Ht 5\' 6"  (1.676 m)   Wt 216 lb (98 kg)   SpO2 96% Comment: room air  BMI 34.86 kg/m   Past Medical History:  Breast cancer, Colon polyp, HLD, Hypothyroidism, IBS, Nephrolithiasis, OA  Past Surgical History:  Her  has a past surgical history that includes Tonsillectomy; Myomectomy (1990); Cystoscopy (1977); Cataract extraction (Bilateral, 2008); Breast biopsy (Right, 1977); Breast lumpectomy (Right, 2006); Knee arthroscopy (10/02/2011); left knee arthroscopy (2020); Dilation and curettage of uterus (2011); and Knee arthroscopy (Left, 05/2018).  Brief Summary:  Jacqueline Jennings is a 69 y.o. female with obstructive sleep apnea.     Subjective:   She received new auto CPAP.  This is working well.  No issues with mask fit.  Not having sinus congestion, sore throat, mouth dryness, or aerophagia.  Physical Exam:   Appearance - well kempt   ENMT - no sinus tenderness, no oral exudate, no LAN, Mallampati 4 airway, no stridor  Respiratory - equal breath sounds bilaterally, no wheezing or rales  CV - s1s2 regular rate and rhythm, no murmurs  Ext - no clubbing, no edema  Skin - no rashes  Psych - normal mood and affect   Sleep Tests:   PSG10/13/10 >> AHI 11  Auto CPAP 02/20/20 to 03/20/20 >> used on 30 of 30 nights with average 8 hrs 35 min.  Average AHI 0.2 with median CPAP 9 and 95 th percentile CPAP 12 cm H2O  Social History:  She  reports that she has never smoked. She has never used smokeless tobacco. She reports current alcohol use. She reports that she does not use drugs.  Family History:  Her family history includes Diabetes in her father; Hypertension in her mother; Leukemia in  her mother; Uterine cancer in her maternal grandmother.         Assessment/Plan:  Obstructive sleep apnea. - she is compliant with CPAP - uses [employer] for her DME - continue auto CPAP 5 to 20 cm H2O - she uses oral appliance intermittently when traveling  Elevated blood pressure. - she has been monitoring her blood pressure at home with automated cuff - ausculation blood pressure reading today was slightly lower compared to her automated readings, but still high - she will f/u with her PCP  Time Spent Involved in Patient Care on Day of Examination:  21 minutes  Follow up:   Patient Instructions  Follow up in 1 year   Medication List:   Allergies as of 03/21/2020      Reactions   Adhesive [tape]    Amoxicillin    Augmentin [amoxicillin-pot Clavulanate] Rash   Drug eruption   Latex Rash   Adhesives products-causes reddness, rash   Nabumetone    REACTION: Rash,Hives      Medication List       Accurate as of March 21, 2020  2:03 PM. If you have any questions, ask your nurse or doctor.        STOP taking these medications   Vitamin B-12 5000 MCG Tbdp Stopped by: Philemon Kingdom, MD     TAKE these medications   acetaminophen 650 MG CR tablet Commonly known as: TYLENOL Take  650 mg by mouth as needed for pain.   Cyanocobalamin-Methylcobalamin 600-600 MCG Subl cyanocobalamin-methylcobalamin 600 mcg-600 mcg sublingual tablet  Place by sublingual route.   fexofenadine 180 MG tablet Commonly known as: ALLEGRA Take 180 mg by mouth daily.   fluocinonide cream 0.05 % Commonly known as: LIDEX fluocinonide 0.05 % topical cream  APPLY TO THE AFFECTED AREA(S) BY TOPICAL ROUTE 2 TIMES PER DAY   ibuprofen 600 MG tablet Commonly known as: ADVIL Take 1 tablet (600 mg total) by mouth every 8 (eight) hours as needed.   nystatin ointment Commonly known as: MYCOSTATIN nystatin 100,000 unit/gram topical ointment  Apply 1 application by topical route.    thyroid 60 MG tablet Commonly known as: Armour Thyroid TAKE ONE TABLET BY MOUTH DAILY BEFORE BREAKFAST   VITAMIN D-3 PO Take 1,000 Units by mouth daily. What changed: Another medication with the same name was removed. Continue taking this medication, and follow the directions you see here. Changed by: Philemon Kingdom, MD       Signature:  Chesley Mires, MD [place] Pager - [phone] 03/21/2020, 2:03 PM

## 2020-03-21 NOTE — Patient Instructions (Addendum)
Please continue Armour 60 mg daily.  Take the thyroid hormones every day, with water, at least 30 minutes before breakfast, separated by at least 4 hours from: - acid reflux medications - calcium - iron - multivitamins  Make sure you get >1000 mg calcium ideally from the diet.  Please come back for a follow-up appointment in 1 year.

## 2020-03-21 NOTE — Progress Notes (Signed)
Patient ID: Jacqueline Jennings, female   DOB: Jan 23, 1951, 69 y.o.   MRN: [id number]  This visit occurred during the SARS-CoV-2 public health emergency.  Safety protocols were in place, including screening questions prior to the visit, additional usage of staff PPE, and extensive cleaning of exam room while observing appropriate contact time as indicated for disinfecting solutions.   HPI  Jacqueline Jennings is a 69 y.o.-year-old female, returning for follow-up for hypothyroidism and B12 deficiency. Last visit 1 year ago.  Reviewed history: Pt. has been dx with hypothyroidism in 2007-2008; started on Synthroid >> not feeling better >> was on Armour >> now Naturethyroid 65 mg.  Her TSH was overly suppressed in the past, as shown below. She was initially on a high dose of Armour: 120 mg, which we subsequently decreased to 90 mg, and then further to 60 mg >> currently on Nature-Throid.  Labs normalized and remained controlled afterwards.  She was on Nature-Throid 65 mg >> now Armour 60 mg daily (due to availability): - in am - fasting - at least 30 min from b'fast - no calcium - no iron - no multivitamins - no PPIs - not on Biotin  Reviewed her TFTs: Lab Results  Component Value Date   TSH 2.17 11/24/2019   TSH 3.18 02/15/2019   TSH 2.48 02/17/2018   TSH 2.88 03/07/2017   TSH 1.60 03/08/2016   TSH 1.87 03/09/2015   TSH 2.39 09/08/2014   TSH 1.67 05/24/2014   TSH 1.62 03/01/2014   TSH 0.03 (L) 01/12/2014   FREET4 0.75 02/15/2019   FREET4 0.79 02/17/2018   FREET4 0.74 03/07/2017   FREET4 0.62 03/08/2016   FREET4 0.67 03/09/2015   FREET4 0.69 09/08/2014   FREET4 0.79 05/24/2014   FREET4 0.73 03/01/2014   FREET4 0.77 01/12/2014   FREET4 1.02 11/23/2013    Pt denies: - feeling nodules in neck - hoarseness - dysphagia - choking - SOB with lying down  We also diagnosed B12 deficiency in the past.  After starting 5000 mcg of methylcobalamin daily, her B12 was higher than target  so the dose was decreased to 5000 mcg every other day.  She is currently on 1000 mcg Me-B12 daily and on this dose her B12 levels have been normal.  Review vitamin B12 levels: Lab Results  Component Value Date   VITAMINB12 459 11/24/2019   VITAMINB12 608 02/15/2019   VITAMINB12 1,282 (H) 02/17/2018   VITAMINB12 982 (H) 02/14/2017   VITAMINB12 759 06/07/2016   VITAMINB12 298 02/22/2016   VITAMINB12 337 03/09/2015   VITAMINB12 280 12/14/2014   VITAMINB12 210 (L) 09/14/2014   She also was recently found to have osteopenia:  Lumbar spine L1-L4 Femoral neck (FN) 33% distal radius  T-score -0.1 RFN: -1.1 LFN: -1.2 n/a  Change in BMD from previous DXA test (%) +6.4%* -7.0%* n/a  (*) statistically significant  No history of fractures.  She is on 1000 units vitamin D daily.  Vitamin D levels reviewed: Lab Results  Component Value Date   VD25OH 48 01/07/2011   VD25OH 48 07/05/2010   VD25OH 43 08/10/2009   VD25OH 35 05/30/2009   VD25OH 40 01/04/2009   She also has a history of BrCa 2006 >> had radiation therapy to right breast.  She lost her husband in 07/2016 due to esophageal cancer.  She was undergoing grief counseling.   ROS: Constitutional: + weight gain/no weight loss, no fatigue, no subjective hyperthermia, no subjective hypothermia Eyes: no blurry vision, no xerophthalmia ENT:  no sore throat, + see HPI Cardiovascular: no CP/no SOB/no palpitations/no leg swelling Respiratory: no cough/no SOB/no wheezing Gastrointestinal: no N/no V/no D/no C/no acid reflux Musculoskeletal: no muscle aches/+ joint aches Skin: no rashes, + improved hair loss, but hair thinning Neurological: no tremors/no numbness/no tingling/no dizziness  I reviewed pt's medications, allergies, PMH, social hx, family hx, and changes were documented in the history of present illness. Otherwise, unchanged from my initial visit note.  Past Medical History:  Diagnosis Date  . Acute medial meniscal tear  10/02/2011   right  . Allergy    seasonal  . Arthritis 09-27-11   Right knee/Cortisone injection 2'13  . Breast cancer ([place]) 2006   hx of with rediation, surgery and adjuvant theapy with tamoxifen  (year two)  . Colon polyps 2014  . Hyperlipidemia   . Hypothyroidism 09-27-11   Low function  . IBS (irritable bowel syndrome)   . Irritable bowel syndrome 05/19/2007   Qualifier: Diagnosis of  By: Arnoldo Morale MD, [place], HX OF 11/26/2006   Qualifier: Diagnosis of  By: Jimmye Norman, LPN, [place] WHETHER GEN/LOC [facility] SITE 02/28/2009   Qualifier: Diagnosis of  By: Arnoldo Morale MD, Balinda Quails Sleep apnea 09-27-11   Cpap used ,started 5'3202   Past Surgical History:  Procedure Laterality Date  . BREAST BIOPSY Right 1977  . BREAST LUMPECTOMY Right 2006   raditon 33 tx-right  . CATARACT EXTRACTION Bilateral 2008  . CYSTOSCOPY  1977  . DILATION AND CURETTAGE OF UTERUS  2011   done after thickening of uterine lining from tamoxifen  . KNEE ARTHROSCOPY  10/02/2011   Procedure: ARTHROSCOPY KNEE;  Surgeon: Gearlean Alf, MD;  Location: [facility];  Service: Orthopedics;  Laterality: Right;  with Debridement, right knee medial meniscus  . KNEE ARTHROSCOPY Left 05/2018  . left knee arthroscopy  2020  . MYOMECTOMY  1990  . TONSILLECTOMY     Social History   Socioeconomic History  . Marital status: Widowed    Spouse name: Not on file  . Number of children: 0  . Years of education: 16  . Highest education level: Bachelor's degree (e.g., BA, AB, BS)  Occupational History  . Occupation: retired  Tobacco Use  . Smoking status: Never Smoker  . Smokeless tobacco: Never Used  Vaping Use  . Vaping Use: Never used  Substance and Sexual Activity  . Alcohol use: Yes    Alcohol/week: 0.0 standard drinks    Comment: rare occ  . Drug use: No  . Sexual activity: Yes  Other Topics Concern  . Not on file  Social History Narrative   Work or School: retired Education officer, museum,  travels a lot      widowed      Spiritual Beliefs: Christian               Social Determinants of Health   Financial Resource Strain: [place]   . Difficulty of Paying Living Expenses: Not hard at all  Food Insecurity: No Food Insecurity  . Worried About Charity fundraiser in the Last Year: Never true  . Ran Out of Food in the Last Year: Never true  Transportation Needs: No Transportation Needs  . Lack of Transportation (Medical): No  . Lack of Transportation (Non-Medical): No  Physical Activity: Insufficiently Active  . Days of Exercise per Week: 2 days  . Minutes of Exercise per Session: 30 min  Stress: No Stress  Concern Present  . Feeling of Stress : Only a little  Social Connections: Moderately Integrated  . Frequency of Communication with Friends and Family: More than three times a week  . Frequency of Social Gatherings with Friends and Family: Once a week  . Attends Religious Services: More than 4 times per year  . Active Member of Clubs or Organizations: Yes  . Attends Archivist Meetings: More than 4 times per year  . Marital Status: Widowed  Intimate Partner Violence:   . Fear of Current or Ex-Partner: Not on file  . Emotionally Abused: Not on file  . Physically Abused: Not on file  . Sexually Abused: Not on file   Current Outpatient Medications on File Prior to Visit  Medication Sig Dispense Refill  . acetaminophen (TYLENOL) 650 MG CR tablet Take 650 mg by mouth as needed for pain.    Francia Greaves THYROID 60 MG tablet TAKE ONE TABLET BY MOUTH DAILY BEFORE BREAKFAST 30 tablet 0  . Cholecalciferol (VITAMIN D-3 PO) Take 1,000 Units by mouth daily.     . Cyanocobalamin (VITAMIN B-12) 5000 MCG TBDP Take 2,500 mg by mouth daily.     . Cyanocobalamin-Methylcobalamin 600-600 MCG SUBL cyanocobalamin-methylcobalamin 600 mcg-600 mcg sublingual tablet  Place by sublingual route.    . fexofenadine (ALLEGRA) 180 MG tablet Take 180 mg by mouth daily.    . fluocinonide  cream (LIDEX) 0.05 % fluocinonide 0.05 % topical cream  APPLY TO THE AFFECTED AREA(S) BY TOPICAL ROUTE 2 TIMES PER DAY    . ibuprofen (ADVIL) 600 MG tablet Take 1 tablet (600 mg total) by mouth every 8 (eight) hours as needed. 30 tablet 0   Current Facility-Administered Medications on File Prior to Visit  Medication Dose Route Frequency Provider Last Rate Last Admin  . 0.9 %  sodium chloride infusion  500 mL Intravenous Continuous Ladene Artist, MD       Allergies  Allergen Reactions  . Adhesive [Tape]   . Amoxicillin   . Augmentin [Amoxicillin-Pot Clavulanate] Rash    Drug eruption  . Latex Rash    Adhesives products-causes reddness, rash  . Nabumetone     REACTION: Rash,Hives   Family History  Problem Relation Age of Onset  . Hypertension Mother   . Leukemia Mother   . Diabetes Father   . Uterine cancer Maternal Grandmother   . Colon cancer Neg Hx     PE: BP 130/88   Pulse 77   Ht 5' 6"  (1.676 m)   Wt 217 lb 9.6 oz (98.7 kg)   SpO2 97%   BMI 35.12 kg/m  Body mass index is 35.12 kg/m. Wt Readings from Last 3 Encounters:  03/21/20 217 lb 9.6 oz (98.7 kg)  02/23/20 214 lb 8 oz (97.3 kg)  12/14/19 212 lb 9.6 oz (96.4 kg)   Constitutional: overweight, in NAD Eyes: PERRLA, EOMI, no exophthalmos ENT: moist mucous membranes, no thyromegaly, no cervical lymphadenopathy Cardiovascular: RRR, No MRG Respiratory: CTA B Gastrointestinal: abdomen soft, NT, ND, BS+ Musculoskeletal: no deformities, strength intact in all 4 Skin: moist, warm, no rashes Neurological: no tremor with outstretched hands, DTR normal in all 4  ASSESSMENT: 1. Hypothyroidism - on desiccated thyroid extract  2. B12 def  3. Hair loss  4.  Osteopenia  PLAN:  1. Patient with longstanding hypothyroidism, prev. on Nature-Throid >> Armour 60 now - latest thyroid labs reviewed with pt >> normal: Lab Results  Component Value Date   TSH 2.17 11/24/2019   -  she continues on Armour 60 mg daily -  pt feels good on this dose. - we discussed about taking the thyroid hormone every day, with water, >30 minutes before breakfast, separated by >4 hours from acid reflux medications, calcium, iron, multivitamins. Pt. is taking it correctly. - will check thyroid tests in 3 months: TSH, free T3 and fT4  2. B12 def -Previously on 5000 mcg B12 daily, then decrease to 5000 mcg every other day and then 1000 mcg daily -She denies fatigue, numbness, tingling, anemia -Latest B12 level was normal on the above dose 11/2019 -We will not repeat this today but will check this in 3 months.  3. Hair loss -Probably initially related to stress of losing her husband, but she continues to have thin hair -Vitamin B12 has now normalized -Hair loss improved  4.  Osteopenia -New problem for me -We reviewed together her latest bone density from earlier this year: Her femoral neck BMD is high in the mild osteopenic range -We discussed that no treatment is needed for now, however, we have to make sure that her serum calcium level stays normal.  I recommended to get more than 1000 mg daily ideally from the diet, but she can supplement if she does not think she gets this much), also, we need to keep a normal vitamin D level (she is on supplementation), and she needs to start exercise.  As of now, she has joint pains and also Achilles tendonitis and she cannot exercise, but we did discuss about doing at least chair exercises until she can exercise more. -We will check her vitamin D level at next blood draw, in 3 months  Orders Placed This Encounter  Procedures  . TSH  . T4, free  . T3, free  . Vitamin B12  . VITAMIN D 25 Hydroxy (Vit-D Deficiency, Fractures)    Philemon Kingdom, MD PhD [facility] Endocrinology

## 2020-03-23 DIAGNOSIS — R2689 Other abnormalities of gait and mobility: Secondary | ICD-10-CM | POA: Diagnosis not present

## 2020-03-23 DIAGNOSIS — M25572 Pain in left ankle and joints of left foot: Secondary | ICD-10-CM | POA: Diagnosis not present

## 2020-03-23 DIAGNOSIS — M7660 Achilles tendinitis, unspecified leg: Secondary | ICD-10-CM | POA: Diagnosis not present

## 2020-03-23 DIAGNOSIS — R531 Weakness: Secondary | ICD-10-CM | POA: Diagnosis not present

## 2020-03-27 DIAGNOSIS — M7660 Achilles tendinitis, unspecified leg: Secondary | ICD-10-CM | POA: Diagnosis not present

## 2020-03-27 DIAGNOSIS — M25572 Pain in left ankle and joints of left foot: Secondary | ICD-10-CM | POA: Diagnosis not present

## 2020-03-27 DIAGNOSIS — R2689 Other abnormalities of gait and mobility: Secondary | ICD-10-CM | POA: Diagnosis not present

## 2020-03-27 DIAGNOSIS — R531 Weakness: Secondary | ICD-10-CM | POA: Diagnosis not present

## 2020-03-29 DIAGNOSIS — R2689 Other abnormalities of gait and mobility: Secondary | ICD-10-CM | POA: Diagnosis not present

## 2020-03-29 DIAGNOSIS — R531 Weakness: Secondary | ICD-10-CM | POA: Diagnosis not present

## 2020-03-29 DIAGNOSIS — M7660 Achilles tendinitis, unspecified leg: Secondary | ICD-10-CM | POA: Diagnosis not present

## 2020-03-29 DIAGNOSIS — M25572 Pain in left ankle and joints of left foot: Secondary | ICD-10-CM | POA: Diagnosis not present

## 2020-03-31 DIAGNOSIS — M25572 Pain in left ankle and joints of left foot: Secondary | ICD-10-CM | POA: Diagnosis not present

## 2020-03-31 DIAGNOSIS — M7660 Achilles tendinitis, unspecified leg: Secondary | ICD-10-CM | POA: Diagnosis not present

## 2020-03-31 DIAGNOSIS — R531 Weakness: Secondary | ICD-10-CM | POA: Diagnosis not present

## 2020-03-31 DIAGNOSIS — M170 Bilateral primary osteoarthritis of knee: Secondary | ICD-10-CM | POA: Diagnosis not present

## 2020-03-31 DIAGNOSIS — R2689 Other abnormalities of gait and mobility: Secondary | ICD-10-CM | POA: Diagnosis not present

## 2020-04-05 DIAGNOSIS — R2689 Other abnormalities of gait and mobility: Secondary | ICD-10-CM | POA: Diagnosis not present

## 2020-04-05 DIAGNOSIS — M25572 Pain in left ankle and joints of left foot: Secondary | ICD-10-CM | POA: Diagnosis not present

## 2020-04-05 DIAGNOSIS — M7660 Achilles tendinitis, unspecified leg: Secondary | ICD-10-CM | POA: Diagnosis not present

## 2020-04-05 DIAGNOSIS — R531 Weakness: Secondary | ICD-10-CM | POA: Diagnosis not present

## 2020-04-06 DIAGNOSIS — M170 Bilateral primary osteoarthritis of knee: Secondary | ICD-10-CM | POA: Diagnosis not present

## 2020-04-07 DIAGNOSIS — R2689 Other abnormalities of gait and mobility: Secondary | ICD-10-CM | POA: Diagnosis not present

## 2020-04-07 DIAGNOSIS — R531 Weakness: Secondary | ICD-10-CM | POA: Diagnosis not present

## 2020-04-07 DIAGNOSIS — M7660 Achilles tendinitis, unspecified leg: Secondary | ICD-10-CM | POA: Diagnosis not present

## 2020-04-07 DIAGNOSIS — M25572 Pain in left ankle and joints of left foot: Secondary | ICD-10-CM | POA: Diagnosis not present

## 2020-04-09 DIAGNOSIS — R2689 Other abnormalities of gait and mobility: Secondary | ICD-10-CM | POA: Diagnosis not present

## 2020-04-09 DIAGNOSIS — R531 Weakness: Secondary | ICD-10-CM | POA: Diagnosis not present

## 2020-04-09 DIAGNOSIS — M25572 Pain in left ankle and joints of left foot: Secondary | ICD-10-CM | POA: Diagnosis not present

## 2020-04-09 DIAGNOSIS — M7660 Achilles tendinitis, unspecified leg: Secondary | ICD-10-CM | POA: Diagnosis not present

## 2020-04-11 DIAGNOSIS — R531 Weakness: Secondary | ICD-10-CM | POA: Diagnosis not present

## 2020-04-11 DIAGNOSIS — R2689 Other abnormalities of gait and mobility: Secondary | ICD-10-CM | POA: Diagnosis not present

## 2020-04-11 DIAGNOSIS — M25572 Pain in left ankle and joints of left foot: Secondary | ICD-10-CM | POA: Diagnosis not present

## 2020-04-11 DIAGNOSIS — M7660 Achilles tendinitis, unspecified leg: Secondary | ICD-10-CM | POA: Diagnosis not present

## 2020-04-13 DIAGNOSIS — M170 Bilateral primary osteoarthritis of knee: Secondary | ICD-10-CM | POA: Diagnosis not present

## 2020-04-18 DIAGNOSIS — R531 Weakness: Secondary | ICD-10-CM | POA: Diagnosis not present

## 2020-04-18 DIAGNOSIS — M7660 Achilles tendinitis, unspecified leg: Secondary | ICD-10-CM | POA: Diagnosis not present

## 2020-04-18 DIAGNOSIS — R2689 Other abnormalities of gait and mobility: Secondary | ICD-10-CM | POA: Diagnosis not present

## 2020-04-18 DIAGNOSIS — M25572 Pain in left ankle and joints of left foot: Secondary | ICD-10-CM | POA: Diagnosis not present

## 2020-04-19 DIAGNOSIS — M7660 Achilles tendinitis, unspecified leg: Secondary | ICD-10-CM | POA: Diagnosis not present

## 2020-04-19 DIAGNOSIS — R2689 Other abnormalities of gait and mobility: Secondary | ICD-10-CM | POA: Diagnosis not present

## 2020-04-19 DIAGNOSIS — M25572 Pain in left ankle and joints of left foot: Secondary | ICD-10-CM | POA: Diagnosis not present

## 2020-04-19 DIAGNOSIS — R531 Weakness: Secondary | ICD-10-CM | POA: Diagnosis not present

## 2020-04-24 DIAGNOSIS — R2689 Other abnormalities of gait and mobility: Secondary | ICD-10-CM | POA: Diagnosis not present

## 2020-04-24 DIAGNOSIS — M7660 Achilles tendinitis, unspecified leg: Secondary | ICD-10-CM | POA: Diagnosis not present

## 2020-04-24 DIAGNOSIS — M25572 Pain in left ankle and joints of left foot: Secondary | ICD-10-CM | POA: Diagnosis not present

## 2020-04-24 DIAGNOSIS — R531 Weakness: Secondary | ICD-10-CM | POA: Diagnosis not present

## 2020-04-26 DIAGNOSIS — R2689 Other abnormalities of gait and mobility: Secondary | ICD-10-CM | POA: Diagnosis not present

## 2020-04-26 DIAGNOSIS — R531 Weakness: Secondary | ICD-10-CM | POA: Diagnosis not present

## 2020-04-26 DIAGNOSIS — M7660 Achilles tendinitis, unspecified leg: Secondary | ICD-10-CM | POA: Diagnosis not present

## 2020-04-26 DIAGNOSIS — M25572 Pain in left ankle and joints of left foot: Secondary | ICD-10-CM | POA: Diagnosis not present

## 2020-05-01 DIAGNOSIS — R2689 Other abnormalities of gait and mobility: Secondary | ICD-10-CM | POA: Diagnosis not present

## 2020-05-01 DIAGNOSIS — R531 Weakness: Secondary | ICD-10-CM | POA: Diagnosis not present

## 2020-05-01 DIAGNOSIS — M7660 Achilles tendinitis, unspecified leg: Secondary | ICD-10-CM | POA: Diagnosis not present

## 2020-05-01 DIAGNOSIS — M25572 Pain in left ankle and joints of left foot: Secondary | ICD-10-CM | POA: Diagnosis not present

## 2020-05-05 DIAGNOSIS — R2689 Other abnormalities of gait and mobility: Secondary | ICD-10-CM | POA: Diagnosis not present

## 2020-05-05 DIAGNOSIS — M7660 Achilles tendinitis, unspecified leg: Secondary | ICD-10-CM | POA: Diagnosis not present

## 2020-05-05 DIAGNOSIS — R531 Weakness: Secondary | ICD-10-CM | POA: Diagnosis not present

## 2020-05-05 DIAGNOSIS — M25572 Pain in left ankle and joints of left foot: Secondary | ICD-10-CM | POA: Diagnosis not present

## 2020-05-09 ENCOUNTER — Ambulatory Visit: Payer: Medicare Other

## 2020-05-09 ENCOUNTER — Other Ambulatory Visit: Payer: Self-pay

## 2020-05-09 ENCOUNTER — Ambulatory Visit (INDEPENDENT_AMBULATORY_CARE_PROVIDER_SITE_OTHER): Payer: Medicare Other | Admitting: Podiatry

## 2020-05-09 DIAGNOSIS — M779 Enthesopathy, unspecified: Secondary | ICD-10-CM | POA: Diagnosis not present

## 2020-05-09 DIAGNOSIS — M7660 Achilles tendinitis, unspecified leg: Secondary | ICD-10-CM

## 2020-05-09 DIAGNOSIS — M19072 Primary osteoarthritis, left ankle and foot: Secondary | ICD-10-CM

## 2020-05-09 NOTE — Patient Instructions (Signed)
New Balance, look at 800 and above Brooks Glycerin or adrealine  Achilles Tendinitis  with Rehab Achilles tendinitis is a disorder of the Achilles tendon. The Achilles tendon connects the large calf muscles (Gastrocnemius and Soleus) to the heel bone (calcaneus). This tendon is sometimes called the heel cord. It is important for pushing-off and standing on your toes and is important for walking, running, or jumping. Tendinitis is often caused by overuse and repetitive microtrauma. SYMPTOMS  Pain, tenderness, swelling, warmth, and redness may occur over the Achilles tendon even at rest.  Pain with pushing off, or flexing or extending the ankle.  Pain that is worsened after or during activity. CAUSES   Overuse sometimes seen with rapid increase in exercise programs or in sports requiring running and jumping.  Poor physical conditioning (strength and flexibility or endurance).  Running sports, especially training running down hills.  Inadequate warm-up before practice or play or failure to stretch before participation.  Injury to the tendon. PREVENTION   Warm up and stretch before practice or competition.  Allow time for adequate rest and recovery between practices and competition.  Keep up conditioning.  Keep up ankle and leg flexibility.  Improve or keep muscle strength and endurance.  Improve cardiovascular fitness.  Use proper technique.  Use proper equipment (shoes, skates).  To help prevent recurrence, taping, protective strapping, or an adhesive bandage may be recommended for several weeks after healing is complete. PROGNOSIS   Recovery may take weeks to several months to heal.  Longer recovery is expected if symptoms have been prolonged.  Recovery is usually quicker if the inflammation is due to a direct blow as compared with overuse or sudden strain. RELATED COMPLICATIONS   Healing time will be prolonged if the condition is not correctly treated. The injury  must be given plenty of time to heal.  Symptoms can reoccur if activity is resumed too soon.  Untreated, tendinitis may increase the risk of tendon rupture requiring additional time for recovery and possibly surgery. TREATMENT   The first treatment consists of rest anti-inflammatory medication, and ice to relieve the pain.  Stretching and strengthening exercises after resolution of pain will likely help reduce the risk of recurrence. Referral to a physical therapist or athletic trainer for further evaluation and treatment may be helpful.  A walking boot or cast may be recommended to rest the Achilles tendon. This can help break the cycle of inflammation and microtrauma.  Arch supports (orthotics) may be prescribed or recommended by your caregiver as an adjunct to therapy and rest.  Surgery to remove the inflamed tendon lining or degenerated tendon tissue is rarely necessary and has shown less than predictable results. MEDICATION   Nonsteroidal anti-inflammatory medications, such as aspirin and ibuprofen, may be used for pain and inflammation relief. Do not take within 7 days before surgery. Take these as directed by your caregiver. Contact your caregiver immediately if any bleeding, stomach upset, or signs of allergic reaction occur. Other minor pain relievers, such as acetaminophen, may also be used.  Pain relievers may be prescribed as necessary by your caregiver. Do not take prescription pain medication for longer than 4 to 7 days. Use only as directed and only as much as you need.  Cortisone injections are rarely indicated. Cortisone injections may weaken tendons and predispose to rupture. It is better to give the condition more time to heal than to use them. HEAT AND COLD  Cold is used to relieve pain and reduce inflammation for acute and  chronic Achilles tendinitis. Cold should be applied for 10 to 15 minutes every 2 to 3 hours for inflammation and pain and immediately after any  activity that aggravates your symptoms. Use ice packs or an ice massage.  Heat may be used before performing stretching and strengthening activities prescribed by your caregiver. Use a heat pack or a warm soak. SEEK MEDICAL CARE IF:  Symptoms get worse or do not improve in 2 weeks despite treatment.  New, unexplained symptoms develop. Drugs used in treatment may produce side effects.  EXERCISES:  RANGE OF MOTION (ROM) AND STRETCHING EXERCISES - Achilles Tendinitis  These exercises may help you when beginning to rehabilitate your injury. Your symptoms may resolve with or without further involvement from your physician, physical therapist or athletic trainer. While completing these exercises, remember:   Restoring tissue flexibility helps normal motion to return to the joints. This allows healthier, less painful movement and activity.  An effective stretch should be held for at least 30 seconds.  A stretch should never be painful. You should only feel a gentle lengthening or release in the stretched tissue.  STRETCH  Gastroc, Standing   Place hands on wall.  Extend right / left leg, keeping the front knee somewhat bent.  Slightly point your toes inward on your back foot.  Keeping your right / left heel on the floor and your knee straight, shift your weight toward the wall, not allowing your back to arch.  You should feel a gentle stretch in the right / left calf. Hold this position for 10 seconds. Repeat 3 times. Complete this stretch 2 times per day.  STRETCH  Soleus, Standing   Place hands on wall.  Extend right / left leg, keeping the other knee somewhat bent.  Slightly point your toes inward on your back foot.  Keep your right / left heel on the floor, bend your back knee, and slightly shift your weight over the back leg so that you feel a gentle stretch deep in your back calf.  Hold this position for 10 seconds. Repeat 3 times. Complete this stretch 2 times per  day.  STRETCH  Gastrocsoleus, Standing  Note: This exercise can place a lot of stress on your foot and ankle. Please complete this exercise only if specifically instructed by your caregiver.   Place the ball of your right / left foot on a step, keeping your other foot firmly on the same step.  Hold on to the wall or a rail for balance.  Slowly lift your other foot, allowing your body weight to press your heel down over the edge of the step.  You should feel a stretch in your right / left calf.  Hold this position for 10 seconds.  Repeat this exercise with a slight bend in your knee. Repeat 3 times. Complete this stretch 2 times per day.   STRENGTHENING EXERCISES - Achilles Tendinitis These exercises may help you when beginning to rehabilitate your injury. They may resolve your symptoms with or without further involvement from your physician, physical therapist or athletic trainer. While completing these exercises, remember:   Muscles can gain both the endurance and the strength needed for everyday activities through controlled exercises.  Complete these exercises as instructed by your physician, physical therapist or athletic trainer. Progress the resistance and repetitions only as guided.  You may experience muscle soreness or fatigue, but the pain or discomfort you are trying to eliminate should never worsen during these exercises. If this pain does  worsen, stop and make certain you are following the directions exactly. If the pain is still present after adjustments, discontinue the exercise until you can discuss the trouble with your clinician.  STRENGTH - Plantar-flexors   Sit with your right / left leg extended. Holding onto both ends of a rubber exercise band/tubing, loop it around the ball of your foot. Keep a slight tension in the band.  Slowly push your toes away from you, pointing them downward.  Hold this position for 10 seconds. Return slowly, controlling the tension in  the band/tubing. Repeat 3 times. Complete this exercise 2 times per day.   STRENGTH - Plantar-flexors   Stand with your feet shoulder width apart. Steady yourself with a wall or table using as little support as needed.  Keeping your weight evenly spread over the width of your feet, rise up on your toes.*  Hold this position for 10 seconds. Repeat 3 times. Complete this exercise 2 times per day.  *If this is too easy, shift your weight toward your right / left leg until you feel challenged. Ultimately, you may be asked to do this exercise with your right / left foot only.  STRENGTH  Plantar-flexors, Eccentric  Note: This exercise can place a lot of stress on your foot and ankle. Please complete this exercise only if specifically instructed by your caregiver.   Place the balls of your feet on a step. With your hands, use only enough support from a wall or rail to keep your balance.  Keep your knees straight and rise up on your toes.  Slowly shift your weight entirely to your right / left toes and pick up your opposite foot. Gently and with controlled movement, lower your weight through your right / left foot so that your heel drops below the level of the step. You will feel a slight stretch in the back of your calf at the end position.  Use the healthy leg to help rise up onto the balls of both feet, then lower weight only on the right / left leg again. Build up to 15 repetitions. Then progress to 3 consecutive sets of 15 repetitions.*  After completing the above exercise, complete the same exercise with a slight knee bend (about 30 degrees). Again, build up to 15 repetitions. Then progress to 3 consecutive sets of 15 repetitions.* Perform this exercise 2 times per day.  *When you easily complete 3 sets of 15, your physician, physical therapist or athletic trainer may advise you to add resistance by wearing a backpack filled with additional weight.  STRENGTH - Plantar Flexors, Seated    Sit on a chair that allows your feet to rest flat on the ground. If necessary, sit at the edge of the chair.  Keeping your toes firmly on the ground, lift your right / left heel as far as you can without increasing any discomfort in your ankle. Repeat 3 times. Complete this exercise 2 times a day.

## 2020-05-10 DIAGNOSIS — R2689 Other abnormalities of gait and mobility: Secondary | ICD-10-CM | POA: Diagnosis not present

## 2020-05-10 DIAGNOSIS — R531 Weakness: Secondary | ICD-10-CM | POA: Diagnosis not present

## 2020-05-10 DIAGNOSIS — M7660 Achilles tendinitis, unspecified leg: Secondary | ICD-10-CM | POA: Diagnosis not present

## 2020-05-10 DIAGNOSIS — M25572 Pain in left ankle and joints of left foot: Secondary | ICD-10-CM | POA: Diagnosis not present

## 2020-05-10 NOTE — Progress Notes (Signed)
Subjective: 70 year old female presents the office today for follow-up evaluation of Achilles tendinitis.  She does state that the physical therapy has been helpful she still having discomfort.  She denies recent injury or falls or changes in the last saw her.  She wants to discuss the MRI in more detail today. Denies any systemic complaints such as fevers, chills, nausea, vomiting. No acute changes since last appointment, and no other complaints at this time.   Objective: AAO x3, NAD DP/PT pulses palpable bilaterally, CRT less than 3 seconds There is still tenderness palpation on the mid substance the Achilles tendon on the left side there is a palpable nodule identified.  Thompson test is negative the Achilles tendon appears to be intact.  No pain with lateral compression of the calcaneus there is no area of pinpoint tenderness.  There is no significant discomfort in the course of the flexor tendons otherwise.  Decreased subtalar joint range of motion.  MMT 5/5. No pain with calf compression, swelling, warmth, erythema  Assessment: Achilles tendinitis left side  Plan: -All treatment options discussed with the patient including all alternatives, risks, complications.  -I reviewed the MRI findings with her.  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  There is mild tibiotalar changes as well. -I again reviewed the previous x-rays with her as well.  I do think the Achilles issues are coming from the gait abnormality given the other arthritic changes.  At this time would continue physical therapy and an [facility] for [facility] to continue PT.  If she is not seeing much improvement we will likely start EPAT but we also did discuss surgery as an option as well.  She wants to try to avoid surgery.  -Patient encouraged to call  the office with any questions, concerns, change in symptoms.   Trula Slade DPM

## 2020-05-11 DIAGNOSIS — M7660 Achilles tendinitis, unspecified leg: Secondary | ICD-10-CM | POA: Diagnosis not present

## 2020-05-11 DIAGNOSIS — R2689 Other abnormalities of gait and mobility: Secondary | ICD-10-CM | POA: Diagnosis not present

## 2020-05-11 DIAGNOSIS — R531 Weakness: Secondary | ICD-10-CM | POA: Diagnosis not present

## 2020-05-11 DIAGNOSIS — M25572 Pain in left ankle and joints of left foot: Secondary | ICD-10-CM | POA: Diagnosis not present

## 2020-05-15 DIAGNOSIS — M7660 Achilles tendinitis, unspecified leg: Secondary | ICD-10-CM | POA: Diagnosis not present

## 2020-05-15 DIAGNOSIS — R531 Weakness: Secondary | ICD-10-CM | POA: Diagnosis not present

## 2020-05-15 DIAGNOSIS — M25572 Pain in left ankle and joints of left foot: Secondary | ICD-10-CM | POA: Diagnosis not present

## 2020-05-15 DIAGNOSIS — R2689 Other abnormalities of gait and mobility: Secondary | ICD-10-CM | POA: Diagnosis not present

## 2020-05-17 DIAGNOSIS — M7660 Achilles tendinitis, unspecified leg: Secondary | ICD-10-CM | POA: Diagnosis not present

## 2020-05-17 DIAGNOSIS — R2689 Other abnormalities of gait and mobility: Secondary | ICD-10-CM | POA: Diagnosis not present

## 2020-05-17 DIAGNOSIS — R531 Weakness: Secondary | ICD-10-CM | POA: Diagnosis not present

## 2020-05-17 DIAGNOSIS — M25572 Pain in left ankle and joints of left foot: Secondary | ICD-10-CM | POA: Diagnosis not present

## 2020-05-23 DIAGNOSIS — R531 Weakness: Secondary | ICD-10-CM | POA: Diagnosis not present

## 2020-05-23 DIAGNOSIS — M25572 Pain in left ankle and joints of left foot: Secondary | ICD-10-CM | POA: Diagnosis not present

## 2020-05-23 DIAGNOSIS — R2689 Other abnormalities of gait and mobility: Secondary | ICD-10-CM | POA: Diagnosis not present

## 2020-05-23 DIAGNOSIS — M7660 Achilles tendinitis, unspecified leg: Secondary | ICD-10-CM | POA: Diagnosis not present

## 2020-05-25 DIAGNOSIS — R2689 Other abnormalities of gait and mobility: Secondary | ICD-10-CM | POA: Diagnosis not present

## 2020-05-25 DIAGNOSIS — M25572 Pain in left ankle and joints of left foot: Secondary | ICD-10-CM | POA: Diagnosis not present

## 2020-05-25 DIAGNOSIS — R531 Weakness: Secondary | ICD-10-CM | POA: Diagnosis not present

## 2020-05-25 DIAGNOSIS — M7660 Achilles tendinitis, unspecified leg: Secondary | ICD-10-CM | POA: Diagnosis not present

## 2020-05-29 DIAGNOSIS — M7660 Achilles tendinitis, unspecified leg: Secondary | ICD-10-CM | POA: Diagnosis not present

## 2020-05-29 DIAGNOSIS — R531 Weakness: Secondary | ICD-10-CM | POA: Diagnosis not present

## 2020-05-29 DIAGNOSIS — M25572 Pain in left ankle and joints of left foot: Secondary | ICD-10-CM | POA: Diagnosis not present

## 2020-05-29 DIAGNOSIS — R2689 Other abnormalities of gait and mobility: Secondary | ICD-10-CM | POA: Diagnosis not present

## 2020-05-30 ENCOUNTER — Ambulatory Visit (INDEPENDENT_AMBULATORY_CARE_PROVIDER_SITE_OTHER): Payer: Medicare Other

## 2020-05-30 ENCOUNTER — Other Ambulatory Visit: Payer: Self-pay

## 2020-05-30 DIAGNOSIS — Z0000 Encounter for general adult medical examination without abnormal findings: Secondary | ICD-10-CM

## 2020-05-30 NOTE — Patient Instructions (Addendum)
Jacqueline Jennings , Thank you for taking time to come for your Medicare Wellness Visit. I appreciate your ongoing commitment to your health goals. Please review the following plan we discussed and let me know if I can assist you in the future.   Screening recommendations/referrals: Colonoscopy: Done 10/31/17 Mammogram: Done 11/30/19 Bone Density: Done 12/07/19 Recommended yearly ophthalmology/optometry visit for glaucoma screening and checkup Recommended yearly dental visit for hygiene and checkup  Vaccinations: Influenza vaccine: Done 02/23/20 Pneumococcal vaccine: Up to date Tdap vaccine: Up to date Shingles vaccine: Shingrix discussed. Please contact your pharmacy for coverage information.    Covid-19:Completed 2/6, 3/3, & 02/05/20  Advanced directives: Please bring a copy of your health care power of attorney and living will to the office at your convenience.  Conditions/risks identified: Lose weight   Next appointment: Follow up in one year for your annual wellness visit    Preventive Care 65 Years and Older, Female Preventive care refers to lifestyle choices and visits with your health care provider that can promote health and wellness. What does preventive care include?  A yearly physical exam. This is also called an annual well check.  Dental exams once or twice a year.  Routine eye exams. Ask your health care provider how often you should have your eyes checked.  Personal lifestyle choices, including:  Daily care of your teeth and gums.  Regular physical activity.  Eating a healthy diet.  Avoiding tobacco and drug use.  Limiting alcohol use.  Practicing safe sex.  Taking low-dose aspirin every day.  Taking vitamin and mineral supplements as recommended by your health care provider. What happens during an annual well check? The services and screenings done by your health care provider during your annual well check will depend on your age, overall health, lifestyle  risk factors, and family history of disease. Counseling  Your health care provider may ask you questions about your:  Alcohol use.  Tobacco use.  Drug use.  Emotional well-being.  Home and relationship well-being.  Sexual activity.  Eating habits.  History of falls.  Memory and ability to understand (cognition).  Work and work Statistician.  Reproductive health. Screening  You may have the following tests or measurements:  Height, weight, and BMI.  Blood pressure.  Lipid and cholesterol levels. These may be checked every 5 years, or more frequently if you are over 45 years old.  Skin check.  Lung cancer screening. You may have this screening every year starting at age 28 if you have a 30-pack-year history of smoking and currently smoke or have quit within the past 15 years.  Fecal occult blood test (FOBT) of the stool. You may have this test every year starting at age 59.  Flexible sigmoidoscopy or colonoscopy. You may have a sigmoidoscopy every 5 years or a colonoscopy every 10 years starting at age 29.  Hepatitis C blood test.  Hepatitis B blood test.  Sexually transmitted disease (STD) testing.  Diabetes screening. This is done by checking your blood sugar (glucose) after you have not eaten for a while (fasting). You may have this done every 1-3 years.  Bone density scan. This is done to screen for osteoporosis. You may have this done starting at age 39.  Mammogram. This may be done every 1-2 years. Talk to your health care provider about how often you should have regular mammograms. Talk with your health care provider about your test results, treatment options, and if necessary, the need for more tests. Vaccines  Your health care provider may recommend certain vaccines, such as:  Influenza vaccine. This is recommended every year.  Tetanus, diphtheria, and acellular pertussis (Tdap, Td) vaccine. You may need a Td booster every 10 years.  Zoster vaccine.  You may need this after age 24.  Pneumococcal 13-valent conjugate (PCV13) vaccine. One dose is recommended after age 26.  Pneumococcal polysaccharide (PPSV23) vaccine. One dose is recommended after age 69. Talk to your health care provider about which screenings and vaccines you need and how often you need them. This information is not intended to replace advice given to you by your health care provider. Make sure you discuss any questions you have with your health care provider. Document Released: 05/05/2015 Document Revised: 12/27/2015 Document Reviewed: 02/07/2015 Elsevier Interactive Patient Education  2017 [employer] Prevention in the Home Falls can cause injuries. They can happen to people of all ages. There are many things you can do to make your home safe and to help prevent falls. What can I do on the outside of my home?  Regularly fix the edges of walkways and driveways and fix any cracks.  Remove anything that might make you trip as you walk through a door, such as a raised step or threshold.  Trim any bushes or trees on the path to your home.  Use bright outdoor lighting.  Clear any walking paths of anything that might make someone trip, such as rocks or tools.  Regularly check to see if handrails are loose or broken. Make sure that both sides of any steps have handrails.  Any raised decks and porches should have guardrails on the edges.  Have any leaves, snow, or ice cleared regularly.  Use sand or salt on walking paths during winter.  Clean up any spills in your garage right away. This includes oil or grease spills. What can I do in the bathroom?  Use night lights.  Install grab bars by the toilet and in the tub and shower. Do not use towel bars as grab bars.  Use non-skid mats or decals in the tub or shower.  If you need to sit down in the shower, use a plastic, non-slip stool.  Keep the floor dry. Clean up any water that spills on the floor as soon  as it happens.  Remove soap buildup in the tub or shower regularly.  Attach bath mats securely with double-sided non-slip rug tape.  Do not have throw rugs and other things on the floor that can make you trip. What can I do in the bedroom?  Use night lights.  Make sure that you have a light by your bed that is easy to reach.  Do not use any sheets or blankets that are too big for your bed. They should not hang down onto the floor.  Have a firm chair that has side arms. You can use this for support while you get dressed.  Do not have throw rugs and other things on the floor that can make you trip. What can I do in the kitchen?  Clean up any spills right away.  Avoid walking on wet floors.  Keep items that you use a lot in easy-to-reach places.  If you need to reach something above you, use a strong step stool that has a grab bar.  Keep electrical cords out of the way.  Do not use floor polish or wax that makes floors slippery. If you must use wax, use non-skid floor wax.  Do  not have throw rugs and other things on the floor that can make you trip. What can I do with my stairs?  Do not leave any items on the stairs.  Make sure that there are handrails on both sides of the stairs and use them. Fix handrails that are broken or loose. Make sure that handrails are as long as the stairways.  Check any carpeting to make sure that it is firmly attached to the stairs. Fix any carpet that is loose or worn.  Avoid having throw rugs at the top or bottom of the stairs. If you do have throw rugs, attach them to the floor with carpet tape.  Make sure that you have a light switch at the top of the stairs and the bottom of the stairs. If you do not have them, ask someone to add them for you. What else can I do to help prevent falls?  Wear shoes that:  Do not have high heels.  Have rubber bottoms.  Are comfortable and fit you well.  Are closed at the toe. Do not wear sandals.  If  you use a stepladder:  Make sure that it is fully opened. Do not climb a closed stepladder.  Make sure that both sides of the stepladder are locked into place.  Ask someone to hold it for you, if possible.  Clearly mark and make sure that you can see:  Any grab bars or handrails.  First and last steps.  Where the edge of each step is.  Use tools that help you move around (mobility aids) if they are needed. These include:  Canes.  Walkers.  Scooters.  Crutches.  Turn on the lights when you go into a dark area. Replace any light bulbs as soon as they burn out.  Set up your furniture so you have a clear path. Avoid moving your furniture around.  If any of your floors are uneven, fix them.  If there are any pets around you, be aware of where they are.  Review your medicines with your doctor. Some medicines can make you feel dizzy. This can increase your chance of falling. Ask your doctor what other things that you can do to help prevent falls. This information is not intended to replace advice given to you by your health care provider. Make sure you discuss any questions you have with your health care provider. Document Released: 02/02/2009 Document Revised: 09/14/2015 Document Reviewed: 05/13/2014 Elsevier Interactive Patient Education  2017 [employer].

## 2020-05-30 NOTE — Progress Notes (Signed)
Virtual Visit via Telephone Note  I connected with  Azaliah Carrero Geremia on 05/30/20 at 10:15 AM EST by telephone and verified that I am speaking with the correct person using two identifiers.  Medicare Annual Wellness visit completed telephonically due to Covid-19 pandemic.   Persons participating in this call: This Health Coach and this patient.   Location: Patient: Home Provider: Office   I discussed the limitations, risks, security and privacy concerns of performing an evaluation and management service by telephone and the availability of in person appointments. The patient expressed understanding and agreed to proceed.  Unable to perform video visit due to video visit attempted and failed and/or patient does not have video capability.   Some vital signs may be absent or patient reported.   Willette Brace, LPN    Subjective:   Jenia Klepper is a 70 y.o. female who presents for Medicare Annual (Subsequent) preventive examination.  Review of Systems     Cardiac Risk Factors include: advanced age (>80men, >88 women);dyslipidemia;obesity (BMI >30kg/m2)     Objective:    There were no vitals filed for this visit. There is no height or weight on file to calculate BMI.  Advanced Directives 05/30/2020 04/30/2019 02/17/2018 02/03/2015 09/27/2011  Does Patient Have a Medical Advance Directive? Yes Yes Yes Yes Patient does not have advance directive  Type of Advance Directive Healthcare Power of [place];Living will - [place] -  Does patient want to make changes to medical advance directive? - Yes (ED - Information included in AVS) - - -  Copy of [place] in Chart? No - copy requested No - copy requested - - -  Pre-existing out of facility DNR order (yellow form or pink MOST form) - - - - No    Current Medications (verified) Outpatient Encounter Medications as of 05/30/2020  Medication Sig  . acetaminophen  (TYLENOL) 650 MG CR tablet Take 650 mg by mouth as needed for pain.  . Cholecalciferol (VITAMIN D-3 PO) Take 1,000 Units by mouth daily.   . Cyanocobalamin-Methylcobalamin 600-600 MCG SUBL cyanocobalamin-methylcobalamin 600 mcg-600 mcg sublingual tablet  Place by sublingual route.  . diclofenac Sodium (VOLTAREN) 1 % GEL Apply topically 4 (four) times daily.  . fexofenadine (ALLEGRA) 180 MG tablet Take 180 mg by mouth daily.  . fluocinonide cream (LIDEX) 0.05 % fluocinonide 0.05 % topical cream  APPLY TO THE AFFECTED AREA(S) BY TOPICAL ROUTE 2 TIMES PER DAY  . ibuprofen (ADVIL) 200 MG tablet Advil 200 mg tablet  Take 1 tablet every 6 hours by oral route.  Marland Kitchen ketoconazole (NIZORAL) 2 % cream Apply 1 application topically daily.  Marland Kitchen nystatin ointment (MYCOSTATIN) nystatin 100,000 unit/gram topical ointment  Apply 1 application by topical route.  . thyroid (ARMOUR THYROID) 60 MG tablet TAKE ONE TABLET BY MOUTH DAILY BEFORE BREAKFAST  . [DISCONTINUED] ibuprofen (ADVIL) 600 MG tablet Take 1 tablet (600 mg total) by mouth every 8 (eight) hours as needed. (Patient not taking: Reported on 05/30/2020)   Facility-Administered Encounter Medications as of 05/30/2020  Medication  . 0.9 %  sodium chloride infusion    Allergies (verified) Adhesive [tape], Amoxicillin, Augmentin [amoxicillin-pot clavulanate], Latex, and Nabumetone   History: Past Medical History:  Diagnosis Date  . Acute medial meniscal tear 10/02/2011   right  . Allergy    seasonal  . Arthritis 09-27-11   Right knee/Cortisone injection 2'13  . Breast cancer ([place]) 2006   hx of with rediation,  surgery and adjuvant theapy with tamoxifen  (year two)  . Colon polyps 2014  . Hyperlipidemia   . Hypothyroidism 09-27-11   Low function  . IBS (irritable bowel syndrome)   . Irritable bowel syndrome 05/19/2007   Qualifier: Diagnosis of  By: Arnoldo Morale MD, [place], HX OF 11/26/2006   Qualifier: Diagnosis of  By: Jimmye Norman, LPN,  [place] WHETHER GEN/LOC [facility] SITE 02/28/2009   Qualifier: Diagnosis of  By: Arnoldo Morale MD, Balinda Quails Sleep apnea 09-27-11   Cpap used ,started 4'8546   Past Surgical History:  Procedure Laterality Date  . BREAST BIOPSY Right 1977  . BREAST LUMPECTOMY Right 2006   raditon 33 tx-right  . CATARACT EXTRACTION Bilateral 2008  . CYSTOSCOPY  1977  . DILATION AND CURETTAGE OF UTERUS  2011   done after thickening of uterine lining from tamoxifen  . KNEE ARTHROSCOPY  10/02/2011   Procedure: ARTHROSCOPY KNEE;  Surgeon: Gearlean Alf, MD;  Location: [facility];  Service: Orthopedics;  Laterality: Right;  with Debridement, right knee medial meniscus  . KNEE ARTHROSCOPY Left 05/2018  . left knee arthroscopy  2020  . MYOMECTOMY  1990  . TONSILLECTOMY     Family History  Problem Relation Age of Onset  . Hypertension Mother   . Leukemia Mother   . Diabetes Father   . Uterine cancer Maternal Grandmother   . Colon cancer Neg Hx    Social History   Socioeconomic History  . Marital status: Widowed    Spouse name: Not on file  . Number of children: 0  . Years of education: 16  . Highest education level: Bachelor's degree (e.g., BA, AB, BS)  Occupational History  . Occupation: retired  Tobacco Use  . Smoking status: Never Smoker  . Smokeless tobacco: Never Used  Vaping Use  . Vaping Use: Never used  Substance and Sexual Activity  . Alcohol use: Yes    Alcohol/week: 0.0 standard drinks    Comment: rare occ  . Drug use: No  . Sexual activity: Yes  Other Topics Concern  . Not on file  Social History Narrative   Work or School: retired Education officer, museum, travels a lot      widowed      Spiritual Beliefs: Christian               Social Determinants of Health   Financial Resource Strain: [place]   . Difficulty of Paying Living Expenses: Not hard at all  Food Insecurity: No Food Insecurity  . Worried About Charity fundraiser in the Last Year: Never true  . Ran  Out of Food in the Last Year: Never true  Transportation Needs: No Transportation Needs  . Lack of Transportation (Medical): No  . Lack of Transportation (Non-Medical): No  Physical Activity: Sufficiently Active  . Days of Exercise per Week: 3 days  . Minutes of Exercise per Session: 60 min  Stress: No Stress Concern Present  . Feeling of Stress : Not at all  Social Connections: Moderately Isolated  . Frequency of Communication with Friends and Family: More than three times a week  . Frequency of Social Gatherings with Friends and Family: More than three times a week  . Attends Religious Services: More than 4 times per year  . Active Member of Clubs or Organizations: No  . Attends Archivist Meetings: Never  . Marital Status: Widowed  Tobacco Counseling Counseling given: Not Answered   Clinical Intake:  Pre-visit preparation completed: Yes  Pain : No/denies pain     BMI - recorded: 34.88 Nutritional Status: BMI > 30  Obese Nutritional Risks: None Diabetes: No  How often do you need to have someone help you when you read instructions, pamphlets, or other written materials from your doctor or pharmacy?: 1 - Never  Diabetic?No  Interpreter Needed?: No  Information entered by :: Charlott Rakes, LPN   Activities of Daily Living In your present state of health, do you have any difficulty performing the following activities: 05/30/2020  Hearing? N  Vision? N  Difficulty concentrating or making decisions? N  Walking or climbing stairs? N  Dressing or bathing? N  Doing errands, shopping? N  Preparing Food and eating ? N  Using the Toilet? N  In the past six months, have you accidently leaked urine? N  Do you have problems with loss of bowel control? N  Managing your Medications? N  Managing your Finances? N  Housekeeping or managing your Housekeeping? N  Some recent data might be hidden    Patient Care Team: Caren Macadam, MD as PCP - General  (Family Medicine) Philemon Kingdom, MD as Consulting Physician (Internal Medicine) Dian Queen, MD as Consulting Physician (Obstetrics and Gynecology) Levy Sjogren, MD as Referring Physician (Dermatology) Marygrace Drought, MD as Consulting Physician (Ophthalmology) Chesley Mires, MD as Consulting Physician (Pulmonary Disease)  Indicate any recent Medical Services you may have received from other than [facility] providers in the past year (date may be approximate).     Assessment:   This is a routine wellness examination for [place].  Hearing/Vision screen  Hearing Screening   125Hz  250Hz  500Hz  1000Hz  2000Hz  3000Hz  4000Hz  6000Hz  8000Hz   Right ear:           Left ear:           Comments: Pt denies any hearing issues   Vision Screening Comments: Pt follows Dr Satira Sark for annual eye exams  Dietary issues and exercise activities discussed: Current Exercise Habits: Home exercise routine, Type of exercise: Other - see comments (physical therapy and knee exercises), Time (Minutes): > 60, Frequency (Times/Week): 3, Weekly Exercise (Minutes/Week): 0  Goals    . Patient Stated     Lose weight     . Weight (lb) < 200 lb (90.7 kg)     Keep moving forward and living life; Enjoying your friends  The next accomplishment      Depression Screen PHQ 2/9 Scores 05/30/2020 04/30/2019 02/22/2019 02/17/2018 02/03/2015  PHQ - 2 Score 0 0 0 0 0    Fall Risk Fall Risk  05/30/2020 04/30/2019 02/17/2018 02/03/2015  Falls in the past year? 0 0 No No  Number falls in past yr: 0 - - -  Injury with Fall? 0 - - -  Risk for fall due to : Impaired vision;Impaired balance/gait Medication side effect - -  Follow up Falls prevention discussed Falls evaluation completed;Education provided;Falls prevention discussed - -    FALL RISK PREVENTION PERTAINING TO THE HOME:  Any stairs in or around the home? Yes  If so, are there any without handrails? No  Home free of loose throw rugs in walkways, pet beds,  electrical cords, etc? Yes  Adequate lighting in your home to reduce risk of falls? Yes   ASSISTIVE DEVICES UTILIZED TO PREVENT FALLS:  Life alert? No  Use of a cane, walker or w/c? No  Grab  bars in the bathroom? No  Shower chair or bench in shower? No  Elevated toilet seat or a handicapped toilet? Yes  TIMED UP AND GO:  Was the test performed? No    Cognitive Function: Declined 6 CIT      6CIT Screen 04/30/2019  What Year? 0 points  What month? 0 points  What time? 0 points  Count back from 20 0 points  Months in reverse 0 points  Repeat phrase 0 points  Total Score 0    Immunizations Immunization History  Administered Date(s) Administered  . Fluad Quad(high Dose 65+) 02/22/2019, 02/23/2020  . Influenza Split 01/14/2011, 02/20/2012  . Influenza Whole 01/21/2007, 01/18/2008, 01/17/2009  . Influenza, High Dose Seasonal PF 02/14/2017, 02/17/2018  . Influenza,inj,Quad PF,6+ Mos 02/08/2013, 02/17/2014, 12/14/2014, 12/19/2015  . Influenza,inj,quad, With Preservative 01/28/2018  . PFIZER(Purple Top)SARS-COV-2 Vaccination 05/29/2019, 06/23/2019, 02/05/2020  . Pneumococcal Conjugate-13 02/14/2017  . Pneumococcal Polysaccharide-23 02/17/2018  . Td 04/22/2005  . Tdap 02/22/2016  . Zoster 02/20/2012    TDAP status: Up to date  Flu Vaccine status: Up to date  Pneumococcal vaccine status: Up to date  Covid-19 vaccine status: Completed vaccines  Qualifies for Shingles Vaccine? Yes   Zostavax completed Yes   Shingrix Completed?: No.    Education has been provided regarding the importance of this vaccine. Patient has been advised to call insurance company to determine out of pocket expense if they have not yet received this vaccine. Advised may also receive vaccine at local pharmacy or Health Dept. Verbalized acceptance and understanding.  Screening Tests Health Maintenance  Topic Date Due  . COVID-19 Vaccine (4 - Booster for Pfizer series) 08/05/2020  . MAMMOGRAM   11/29/2020  . COLONOSCOPY (Pts 45-13yrs Insurance coverage will need to be confirmed)  11/01/2022  . TETANUS/TDAP  02/21/2026  . INFLUENZA VACCINE  Completed  . DEXA SCAN  Completed  . Hepatitis C Screening  Completed  . PNA vac Low Risk Adult  Completed    Health Maintenance  There are no preventive care reminders to display for this patient.  Colorectal cancer screening: Type of screening: Colonoscopy. Completed 10/31/17. Repeat every 5 years  Mammogram status: Completed 11/30/19. Repeat every year  Bone Density status: Completed 12/07/19. Results reflect: Bone density results: NORMAL. Repeat every 2 years.  Additional Screening:  Hepatitis C Screening:  Completed 02/20/15  Vision Screening: Recommended annual ophthalmology exams for early detection of glaucoma and other disorders of the eye. Is the patient up to date with their annual eye exam?  Yes  Who is the provider or what is the name of the office in which the patient attends annual eye exams? Dr Satira Sark   Dental Screening: Recommended annual dental exams for proper oral hygiene  Community Resource Referral / Chronic Care Management: CRR required this visit?  No   CCM required this visit?  No      Plan:     I have personally reviewed and noted the following in the patient's chart:   . Medical and social history . Use of alcohol, tobacco or illicit drugs  . Current medications and supplements . Functional ability and status . Nutritional status . Physical activity . Advanced directives . List of other physicians . Hospitalizations, surgeries, and ER visits in previous 12 months . Vitals . Screenings to include cognitive, depression, and falls . Referrals and appointments  In addition, I have reviewed and discussed with patient certain preventive protocols, quality metrics, and best practice recommendations. A written personalized care  plan for preventive services as well as general preventive health  recommendations were provided to patient.     Willette Brace, LPN   06/01/5619   Nurse Notes: None

## 2020-05-31 DIAGNOSIS — R2689 Other abnormalities of gait and mobility: Secondary | ICD-10-CM | POA: Diagnosis not present

## 2020-05-31 DIAGNOSIS — R531 Weakness: Secondary | ICD-10-CM | POA: Diagnosis not present

## 2020-05-31 DIAGNOSIS — M7660 Achilles tendinitis, unspecified leg: Secondary | ICD-10-CM | POA: Diagnosis not present

## 2020-05-31 DIAGNOSIS — M25572 Pain in left ankle and joints of left foot: Secondary | ICD-10-CM | POA: Diagnosis not present

## 2020-06-05 DIAGNOSIS — M25572 Pain in left ankle and joints of left foot: Secondary | ICD-10-CM | POA: Diagnosis not present

## 2020-06-05 DIAGNOSIS — M7660 Achilles tendinitis, unspecified leg: Secondary | ICD-10-CM | POA: Diagnosis not present

## 2020-06-05 DIAGNOSIS — R2689 Other abnormalities of gait and mobility: Secondary | ICD-10-CM | POA: Diagnosis not present

## 2020-06-05 DIAGNOSIS — R531 Weakness: Secondary | ICD-10-CM | POA: Diagnosis not present

## 2020-06-06 DIAGNOSIS — M25572 Pain in left ankle and joints of left foot: Secondary | ICD-10-CM | POA: Diagnosis not present

## 2020-06-06 DIAGNOSIS — R2689 Other abnormalities of gait and mobility: Secondary | ICD-10-CM | POA: Diagnosis not present

## 2020-06-06 DIAGNOSIS — M7660 Achilles tendinitis, unspecified leg: Secondary | ICD-10-CM | POA: Diagnosis not present

## 2020-06-06 DIAGNOSIS — R531 Weakness: Secondary | ICD-10-CM | POA: Diagnosis not present

## 2020-06-09 DIAGNOSIS — M170 Bilateral primary osteoarthritis of knee: Secondary | ICD-10-CM | POA: Diagnosis not present

## 2020-06-09 DIAGNOSIS — M1712 Unilateral primary osteoarthritis, left knee: Secondary | ICD-10-CM | POA: Diagnosis not present

## 2020-06-15 DIAGNOSIS — R2689 Other abnormalities of gait and mobility: Secondary | ICD-10-CM | POA: Diagnosis not present

## 2020-06-15 DIAGNOSIS — R531 Weakness: Secondary | ICD-10-CM | POA: Diagnosis not present

## 2020-06-15 DIAGNOSIS — M25572 Pain in left ankle and joints of left foot: Secondary | ICD-10-CM | POA: Diagnosis not present

## 2020-06-15 DIAGNOSIS — M7660 Achilles tendinitis, unspecified leg: Secondary | ICD-10-CM | POA: Diagnosis not present

## 2020-06-20 ENCOUNTER — Ambulatory Visit (INDEPENDENT_AMBULATORY_CARE_PROVIDER_SITE_OTHER): Payer: Medicare Other | Admitting: Podiatry

## 2020-06-20 ENCOUNTER — Other Ambulatory Visit: Payer: Self-pay

## 2020-06-20 ENCOUNTER — Encounter: Payer: Self-pay | Admitting: Podiatry

## 2020-06-20 DIAGNOSIS — M25572 Pain in left ankle and joints of left foot: Secondary | ICD-10-CM | POA: Diagnosis not present

## 2020-06-20 DIAGNOSIS — M1990 Unspecified osteoarthritis, unspecified site: Secondary | ICD-10-CM | POA: Insufficient documentation

## 2020-06-20 DIAGNOSIS — M19072 Primary osteoarthritis, left ankle and foot: Secondary | ICD-10-CM | POA: Diagnosis not present

## 2020-06-20 DIAGNOSIS — M7660 Achilles tendinitis, unspecified leg: Secondary | ICD-10-CM | POA: Diagnosis not present

## 2020-06-20 DIAGNOSIS — R531 Weakness: Secondary | ICD-10-CM | POA: Diagnosis not present

## 2020-06-20 DIAGNOSIS — M779 Enthesopathy, unspecified: Secondary | ICD-10-CM | POA: Diagnosis not present

## 2020-06-20 DIAGNOSIS — C50919 Malignant neoplasm of unspecified site of unspecified female breast: Secondary | ICD-10-CM | POA: Insufficient documentation

## 2020-06-20 DIAGNOSIS — R2689 Other abnormalities of gait and mobility: Secondary | ICD-10-CM | POA: Diagnosis not present

## 2020-06-20 MED ORDER — NITROGLYCERIN 0.2 MG/HR TD PT24: 0.2000 mg | MEDICATED_PATCH | Freq: Every day | TRANSDERMAL | 1 refills | Status: DC

## 2020-06-26 NOTE — Progress Notes (Signed)
Subjective: 70 year old female presents the office today for follow-up evaluation of Achilles tendinitis.  She has been doing physical therapy and she feels it has been somewhat better but she still gets some soreness and tenderness along the area of the Achilles tendon.  No increase in swelling.  She has stopped 1 physical therapy but she has been doing the exercises at home. She was hoping that the "knot" would go away. Denies any systemic complaints such as fevers, chills, nausea, vomiting. No acute changes since last appointment, and no other complaints at this time.   Objective: AAO x3, NAD DP/PT pulses palpable bilaterally, CRT less than 3 seconds There is still tenderness palpation on the mid substance the Achilles tendon on the left side there is a palpable nodule identified.  Thompson test is negative the Achilles tendon appears to be intact.  No pain with lateral compression of the calcaneus there is no area of pinpoint tenderness.  There is no significant discomfort in the course of the flexor tendons otherwise.  Decreased subtalar joint range of motion.  MMT 5/5. No pain with calf compression, swelling, warmth, erythema  Assessment: Achilles tendinitis left side  Plan: -All treatment options discussed with the patient including all alternatives, risks, complications.  -We discussed both conservative as well as surgical treatment options.  For now she wants to continue conservative treatment.  Encouraged to continue stretching exercises at home, supportive shoes, heel left.  Discussed other treatment options including EPAT.  Trula Slade DPM

## 2020-06-30 ENCOUNTER — Other Ambulatory Visit (INDEPENDENT_AMBULATORY_CARE_PROVIDER_SITE_OTHER): Payer: Medicare Other

## 2020-06-30 DIAGNOSIS — M85852 Other specified disorders of bone density and structure, left thigh: Secondary | ICD-10-CM

## 2020-06-30 DIAGNOSIS — M85851 Other specified disorders of bone density and structure, right thigh: Secondary | ICD-10-CM

## 2020-06-30 DIAGNOSIS — Z8639 Personal history of other endocrine, nutritional and metabolic disease: Secondary | ICD-10-CM

## 2020-06-30 DIAGNOSIS — E039 Hypothyroidism, unspecified: Secondary | ICD-10-CM | POA: Diagnosis not present

## 2020-06-30 LAB — TSH: TSH: 1.88 u[IU]/mL (ref 0.35–4.50)

## 2020-06-30 LAB — T4, FREE: Free T4: 0.79 ng/dL (ref 0.60–1.60)

## 2020-06-30 LAB — VITAMIN D 25 HYDROXY (VIT D DEFICIENCY, FRACTURES): VITD: 22.57 ng/mL — ABNORMAL LOW (ref 30.00–100.00)

## 2020-06-30 LAB — T3, FREE: T3, Free: 3.4 pg/mL (ref 2.3–4.2)

## 2020-06-30 LAB — VITAMIN B12: Vitamin B-12: 844 pg/mL (ref 211–911)

## 2020-06-30 NOTE — Addendum Note (Signed)
Addended by: Trenda Moots on: 09/06/3356 03:05 PM   Modules accepted: Orders

## 2020-07-13 ENCOUNTER — Encounter: Payer: Self-pay | Admitting: Internal Medicine

## 2020-07-13 DIAGNOSIS — E039 Hypothyroidism, unspecified: Secondary | ICD-10-CM

## 2020-07-15 NOTE — Telephone Encounter (Signed)
Orders have been placed.

## 2020-08-28 ENCOUNTER — Other Ambulatory Visit: Payer: Self-pay

## 2020-08-28 ENCOUNTER — Encounter: Payer: Self-pay | Admitting: Podiatry

## 2020-08-28 ENCOUNTER — Ambulatory Visit (INDEPENDENT_AMBULATORY_CARE_PROVIDER_SITE_OTHER): Payer: Medicare Other | Admitting: Podiatry

## 2020-08-28 DIAGNOSIS — M7662 Achilles tendinitis, left leg: Secondary | ICD-10-CM

## 2020-08-28 DIAGNOSIS — M19072 Primary osteoarthritis, left ankle and foot: Secondary | ICD-10-CM

## 2020-08-28 MED ORDER — NITROGLYCERIN 0.2 MG/HR TD PT24: 0.2000 mg | MEDICATED_PATCH | Freq: Every day | TRANSDERMAL | 1 refills | Status: DC

## 2020-09-02 NOTE — Progress Notes (Signed)
Subjective: 70 year old female presents the office today for follow-up evaluation of Achilles tendinitis.  She says overall she does feel it she is getting better although slowly.  She states that the knot is still present along the Achilles but appears to be somewhat better.  She feels the Nitropatch has been helpful.  No recent injury or falls or changes.  She is still doing therapy at home.  Objective: AAO x3, NAD DP/PT pulses palpable bilaterally, CRT less than 3 seconds There is still tenderness palpation on the mid substance the Achilles tendon although somewhat improved on the left side.  Palpable nodule still evident although appears to be softer today.  This is on the mid substance the Achilles tendon.  Thompson test is negative.  No deficit noted.  No pain with lateral compression of calcaneus or posterior aspect.  No area of pinpoint tenderness. No pain with calf compression, swelling, warmth, erythema  Assessment: Achilles tendinitis left side; arthritis  Plan: -All treatment options discussed with the patient including all alternatives, risks, complications.  -I reviewed the MRI with her in person.  Discussed the coalition as well as I showed her the Achilles on the MRI.  We discussed treatment options with conservative as well as surgical.  She feels that she is making progress.  Discussed with that she is at risk of rupture of the Achilles tendon given how chronic this is been ongoing for.  She was told if any surgical intervention for now.  She is to continue with home stretching, rehab.  Use a heel lift.  Voltaren gel as needed.  Refilled the Nitropatch as this has been helpful as well. I refilled this today and she likes the [employer] brand as it sticks better.  She not having any side effects with this.  Trula Slade DPM

## 2020-10-28 DIAGNOSIS — Z20822 Contact with and (suspected) exposure to covid-19: Secondary | ICD-10-CM | POA: Diagnosis not present

## 2020-10-30 ENCOUNTER — Other Ambulatory Visit: Payer: Self-pay

## 2020-10-30 ENCOUNTER — Ambulatory Visit (INDEPENDENT_AMBULATORY_CARE_PROVIDER_SITE_OTHER): Payer: Medicare Other | Admitting: Podiatry

## 2020-10-30 DIAGNOSIS — M7662 Achilles tendinitis, left leg: Secondary | ICD-10-CM | POA: Diagnosis not present

## 2020-11-02 NOTE — Progress Notes (Signed)
Subjective: 71 year old female presents the office today for follow-up evaluation of Achilles tendinitis.  She states is doing better and she feels that just needs more time to heal.  No recent injury or trauma.  She wears the Nitropatch intermittently.  No recent injury or falls or changes otherwise.  She still doing home physical therapy.   Objective: AAO x3, NAD DP/PT pulses palpable bilaterally, CRT less than 3 seconds There is still tenderness palpation on the mid substance the Achilles tendon although somewhat improved on the left side.  There is still a palpable nodule evident although appears to be somewhat softer compared to previous appointments.  This is on the mid substance the Achilles tendon.  Thompson test is negative.  No deficit noted.  No pain with lateral compression of calcaneus or posterior aspect.  No area of pinpoint tenderness. No pain with calf compression, swelling, warmth, erythema  Assessment: Achilles tendinitis left side; arthritis  Plan: -All treatment options discussed with the patient including all alternatives, risks, complications.  -We discussed with conservative as well as surgical treatment options.  She wants to have an surgical intervention.  Discussed continuing conservative care including resting, icing daily.  She will also continue the Nitropatch as well as when she is in good arch supports.  Discussed there is a risk of tendon tear, rupture given the prolonged nature of the Achilles tendonitis.   Trula Slade DPM

## 2020-11-07 ENCOUNTER — Telehealth: Payer: Self-pay | Admitting: Pulmonary Disease

## 2020-11-07 NOTE — Telephone Encounter (Signed)
Spoke with pt and notified her that C-Pap compliance was available via [place] so if we did need it we had access to it. Pt stated understanding. Nothing further needed at this time.

## 2020-11-15 ENCOUNTER — Encounter: Payer: Self-pay | Admitting: Family Medicine

## 2020-11-15 DIAGNOSIS — H04123 Dry eye syndrome of bilateral lacrimal glands: Secondary | ICD-10-CM | POA: Diagnosis not present

## 2020-11-15 DIAGNOSIS — Z961 Presence of intraocular lens: Secondary | ICD-10-CM | POA: Diagnosis not present

## 2020-11-15 DIAGNOSIS — H524 Presbyopia: Secondary | ICD-10-CM | POA: Diagnosis not present

## 2020-11-15 DIAGNOSIS — H40013 Open angle with borderline findings, low risk, bilateral: Secondary | ICD-10-CM | POA: Diagnosis not present

## 2020-12-04 DIAGNOSIS — Z1231 Encounter for screening mammogram for malignant neoplasm of breast: Secondary | ICD-10-CM | POA: Diagnosis not present

## 2020-12-04 LAB — HM MAMMOGRAPHY

## 2020-12-07 ENCOUNTER — Encounter: Payer: Self-pay | Admitting: Family Medicine

## 2021-01-24 IMAGING — MR MR ANKLE*L* W/O CM
4 of 5 series · 12 of 40 positions shown · non-contrast
Comparison: None.

CLINICAL DATA: Achilles tendinitis. Posterior ankle pain with gait
changes. Evaluate for Achilles tendon tear.

EXAM:
MRI OF THE LEFT ANKLE WITHOUT CONTRAST
TECHNIQUE: Multiplanar, multisequence MR imaging of the ankle was performed. No
intravenous contrast was administered.

[Series 3: T1 · axial · left · 3.0mm · 0.25mm/px · z∈[-80,+21]mm · 3 of 36 slices shown (1 of 2)]
[im 5/36]
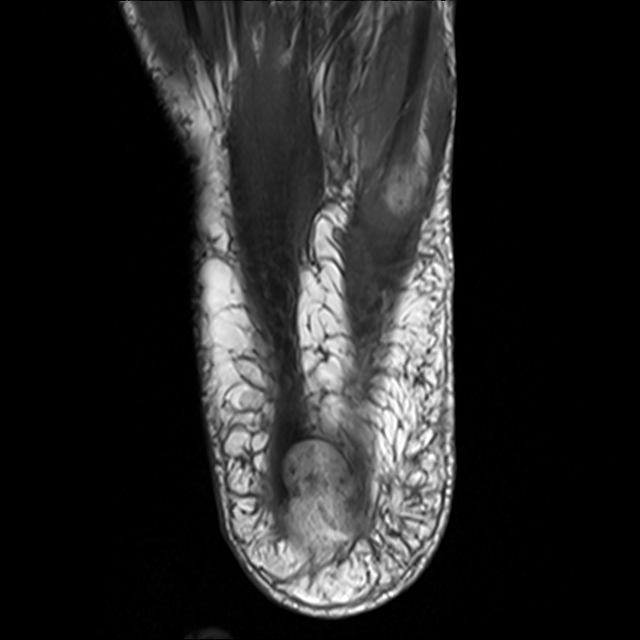
[im 18/36]
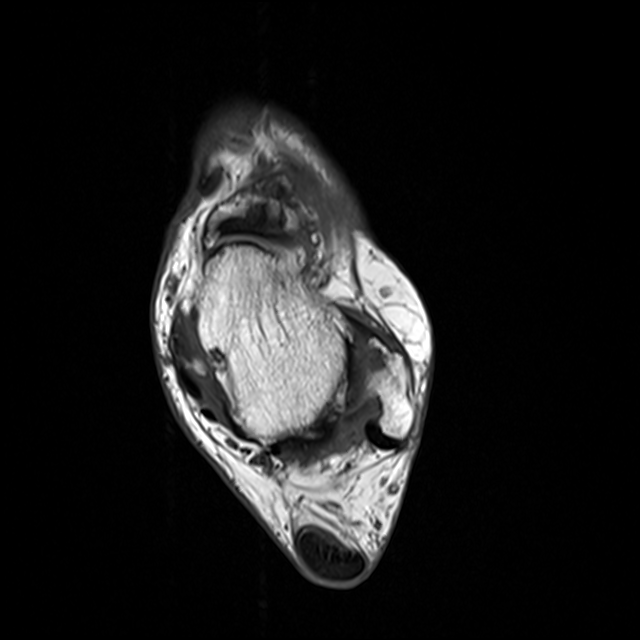
[im 31/36]
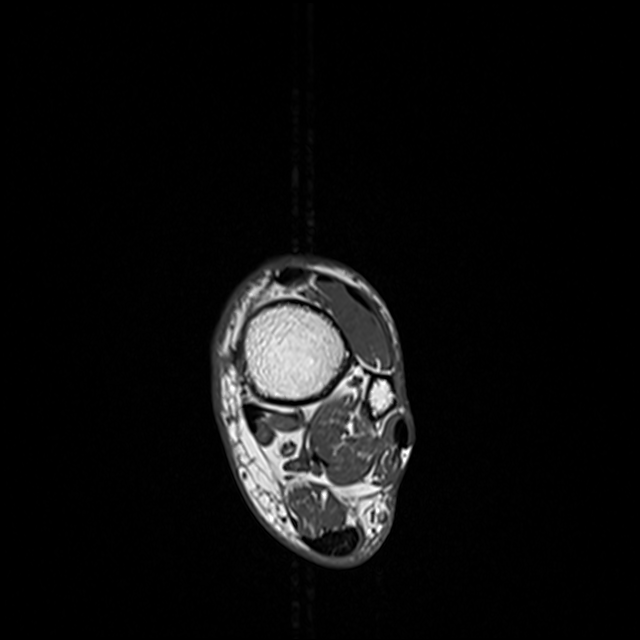

[Series 4: T2 fat-sat · axial · left · 3.0mm · 0.25mm/px · z∈[-85,+27]mm · 3 of 36 slices shown (1 of 2)]
[im 4/36]
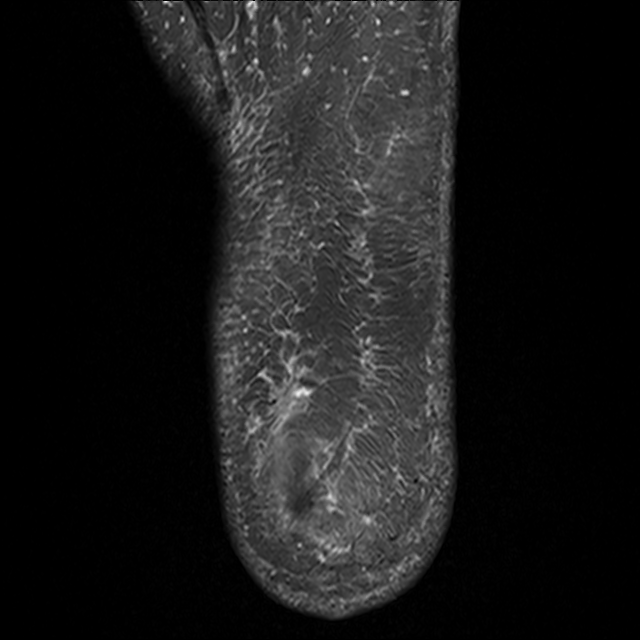
[im 20/36]
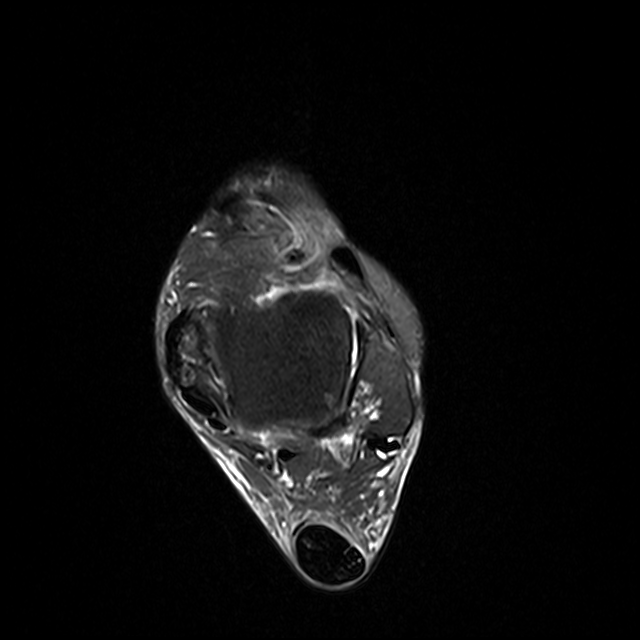
[im 32/36]
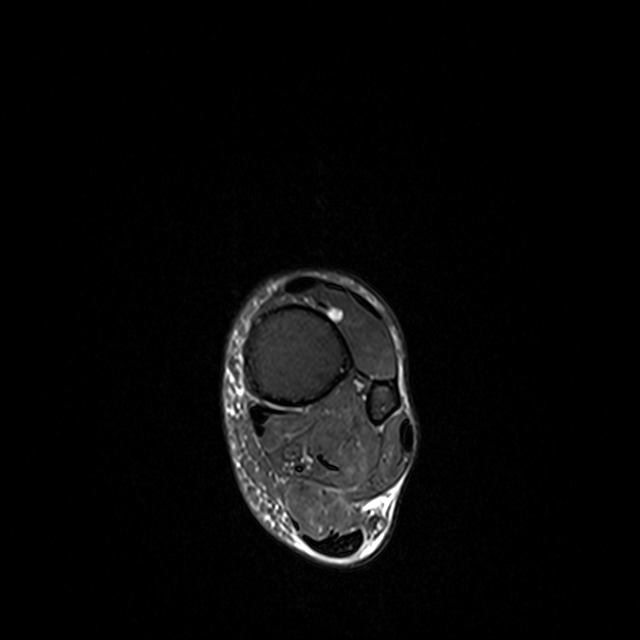

[Series 5: T1 · sagittal · left · 4.0mm · 0.27mm/px · 3 of 22 slices shown (2 of 2)]
[im 5/22]
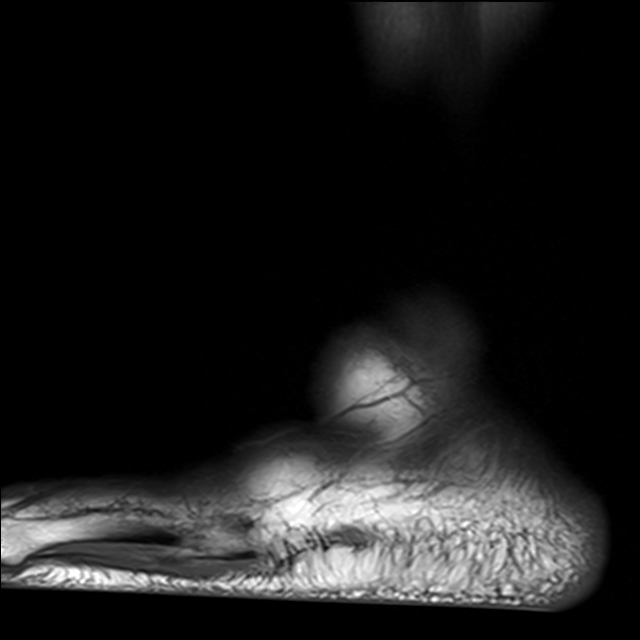
[im 13/22]
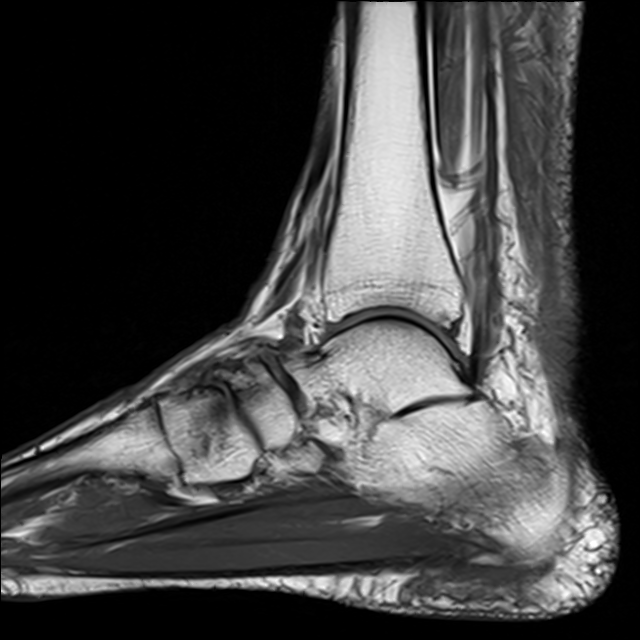
[im 22/22]
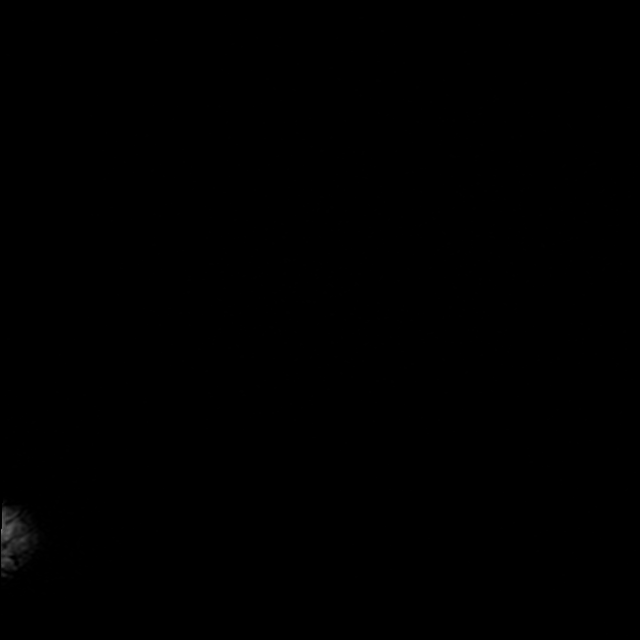

[Series 7: T2 fat-sat · coronal · left · 3.0mm · 0.25mm/px · 3 of 33 slices shown (2 of 2)]
[im 5/33]
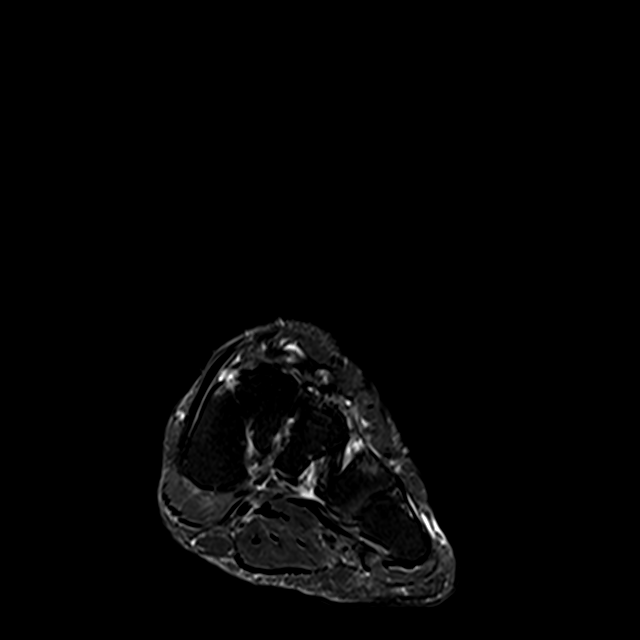
[im 17/33]
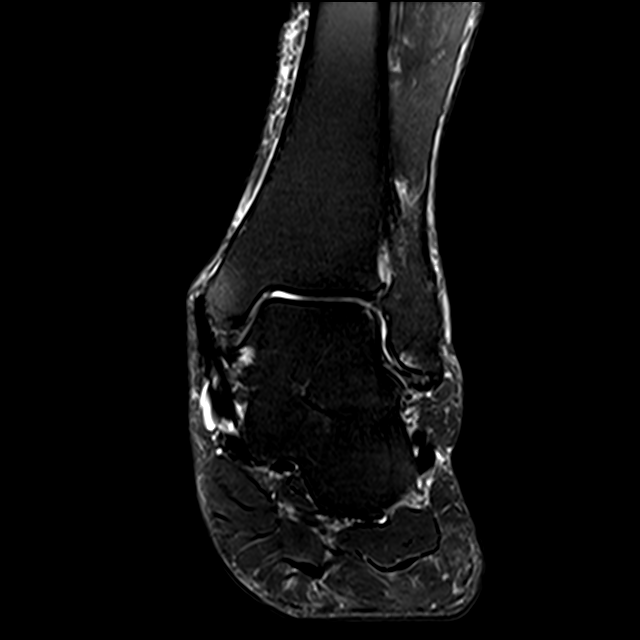
[im 29/33]
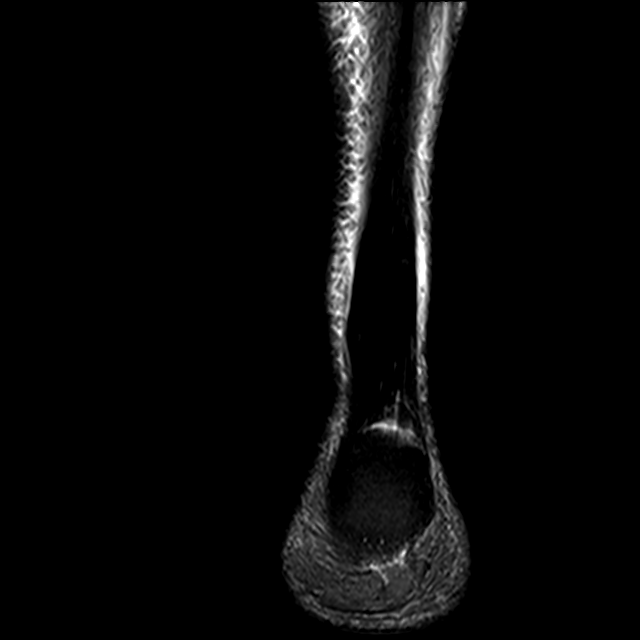

[12 of 40 positions shown; findings below may reference images not displayed]

FINDINGS: TENDONS

Peroneal: Intact and normally positioned.

Posteromedial: The distal posterior tibialis tendon appears mildly
attenuated without discrete longitudinal split tear. There is a
small amount of fluid within the posterior tibialis tendon sheath.
The additional medial flexor tendons appear normal.

Anterior: Intact and normally positioned.

Achilles: Moderate fusiform non insertional Achilles tendinosis. The
Achilles tendon has a maximal AP diameter of 1.4 cm approximately 3
cm proximal to its insertion. No tendon tear, significant
retrocalcaneal bursitis or significant reactive edema in the
calcaneal tuberosity.

Plantar Fascia: Intact.

LIGAMENTS

Lateral: The anterior and posterior talofibular and calcaneofibular
ligaments are intact.

Medial: The deltoid and visualized portions of the spring ligament
appear intact.

CARTILAGE AND BONES

Ankle Joint: No significant ankle joint effusion. There are mild
tibiotalar degenerative changes with subchondral cyst formation
laterally in the talar dome.

Subtalar Joints/Sinus Tarsi: Osseous subtalar coalition with
associated dorsal talar beaking and moderate midfoot degenerative
changes. The tarsal sinus appears normal.

Bones: No acute osseous findings. As above, osseous subtalar
coalition and midfoot degenerative changes. There are prominent
subchondral cysts dorsally in the navicular. There is fragmented
spurring of both malleoli.

Other: Mild posterior subcutaneous edema without focal fluid
collection.
IMPRESSION: 1. Moderate fusiform non insertional Achilles tendinosis without
tear.
2. Mild posterior tibialis tenosynovitis.
3. Osseous subtalar coalition with associated dorsal talar beaking
and moderate midfoot degenerative changes.
4. Mild tibiotalar degenerative changes with subchondral cyst
formation laterally in the talar dome. No acute osseous findings.

## 2021-02-05 ENCOUNTER — Ambulatory Visit: Payer: Medicare Other | Admitting: Podiatry

## 2021-02-23 ENCOUNTER — Ambulatory Visit: Payer: Medicare Other | Admitting: Family Medicine

## 2021-02-24 DIAGNOSIS — Z20822 Contact with and (suspected) exposure to covid-19: Secondary | ICD-10-CM | POA: Diagnosis not present

## 2021-02-26 ENCOUNTER — Ambulatory Visit: Payer: Medicare Other | Admitting: Family Medicine

## 2021-03-07 ENCOUNTER — Encounter: Payer: Self-pay | Admitting: Family Medicine

## 2021-03-07 ENCOUNTER — Ambulatory Visit (INDEPENDENT_AMBULATORY_CARE_PROVIDER_SITE_OTHER): Payer: Medicare Other | Admitting: Family Medicine

## 2021-03-07 VITALS — BP 130/80 | HR 76 | Temp 98.0°F | Ht 66.0 in | Wt 219.2 lb

## 2021-03-07 DIAGNOSIS — N390 Urinary tract infection, site not specified: Secondary | ICD-10-CM

## 2021-03-07 DIAGNOSIS — E785 Hyperlipidemia, unspecified: Secondary | ICD-10-CM | POA: Diagnosis not present

## 2021-03-07 DIAGNOSIS — L304 Erythema intertrigo: Secondary | ICD-10-CM | POA: Diagnosis not present

## 2021-03-07 DIAGNOSIS — L719 Rosacea, unspecified: Secondary | ICD-10-CM

## 2021-03-07 DIAGNOSIS — E559 Vitamin D deficiency, unspecified: Secondary | ICD-10-CM | POA: Diagnosis not present

## 2021-03-07 DIAGNOSIS — Z23 Encounter for immunization: Secondary | ICD-10-CM

## 2021-03-07 DIAGNOSIS — Z853 Personal history of malignant neoplasm of breast: Secondary | ICD-10-CM

## 2021-03-07 DIAGNOSIS — G4733 Obstructive sleep apnea (adult) (pediatric): Secondary | ICD-10-CM

## 2021-03-07 DIAGNOSIS — R300 Dysuria: Secondary | ICD-10-CM | POA: Diagnosis not present

## 2021-03-07 DIAGNOSIS — L821 Other seborrheic keratosis: Secondary | ICD-10-CM | POA: Diagnosis not present

## 2021-03-07 DIAGNOSIS — E538 Deficiency of other specified B group vitamins: Secondary | ICD-10-CM

## 2021-03-07 DIAGNOSIS — R7301 Impaired fasting glucose: Secondary | ICD-10-CM

## 2021-03-07 DIAGNOSIS — Z1283 Encounter for screening for malignant neoplasm of skin: Secondary | ICD-10-CM | POA: Diagnosis not present

## 2021-03-07 DIAGNOSIS — L820 Inflamed seborrheic keratosis: Secondary | ICD-10-CM | POA: Diagnosis not present

## 2021-03-07 DIAGNOSIS — M1990 Unspecified osteoarthritis, unspecified site: Secondary | ICD-10-CM | POA: Diagnosis not present

## 2021-03-07 DIAGNOSIS — E039 Hypothyroidism, unspecified: Secondary | ICD-10-CM

## 2021-03-07 DIAGNOSIS — B078 Other viral warts: Secondary | ICD-10-CM | POA: Diagnosis not present

## 2021-03-07 DIAGNOSIS — B0089 Other herpesviral infection: Secondary | ICD-10-CM | POA: Diagnosis not present

## 2021-03-07 MED ORDER — NITROFURANTOIN MONOHYD MACRO 100 MG PO CAPS
100.0000 mg | ORAL_CAPSULE | Freq: Two times a day (BID) | ORAL | 0 refills | Status: DC
Start: 2021-03-07 — End: 2021-06-28

## 2021-03-07 NOTE — Progress Notes (Signed)
Jacqueline Jennings DOB: 1950-05-31 Encounter date: 03/07/2021  This is a 70 y.o. female who presents with Chief Complaint  Patient presents with   Follow-up    History of present illness: Last visit with me was 1 year ago. Did have COVID 11/2. Test positive on 11/5. Confirmed positive with PCR 11/7. Had head congestion. Some chills. Mild temp elevation. Fatigue. Sneezing. Diarrhea. Took tylenol. Rested. Feels back to normal; stamina better now.   Was working out regularly in 2020. Just felt like after covid vaccination her muscles just went away. Felt like she lost muscle mass; worse after second shot and then booster. Finally feeling that legs are coming back to normal. When she went to the gym, felt like things were worse.   Would like to get flu shot today. Discussed prevnar 20; we will get this at later date.   Doesn't like weight. Wants to lose weight. Always hungry. Does cook. Eats good variety. Grills foods. Steams or roasts vegetables. Not been on weight loss medications in the past. Has done weight watchers since early 20's. Tried paleo diet. Tries to avoid potatoes, breads, rice. Doesn't eat sandwiches. Stopped with weight watchers meetings when stopped for covid. The hunger gets to her and then that's when she has hard time.   OSA: follows with sleep specialist; uses machine nightly. Does well with machine and she is good about using it nightly. Also has dental appliance which she uses just when she travels which works well for her.    Hypothyroid:nature-thyroid 65mg  daily. Follows with Dr. Cruzita Lederer. Last bloodwork was 06/2020.    Hyperlipidemia: diet controlled.    Released from specialty care for history of breast cancer. Does still see gyn- [facility]  Last mammogram was 8/22.  This was benign.   B12 def: 253mcg daily with this; last bloodwork in August was stable.    Vitamin D def: 2000 units daily; last checked with Dr. Renne Crigler.    Seasonal allergies:  allegra - Doing well with this now. Controls symptoms.    Still sees derm; Dr. Rozann Lesches. just had visit today.     Allergies  Allergen Reactions   Adhesive [Tape]    Amoxicillin    Augmentin [Amoxicillin-Pot Clavulanate] Rash    Drug eruption   Latex Rash    Adhesives products-causes reddness, rash   Nabumetone     REACTION: Rash,Hives   Current Meds  Medication Sig   acetaminophen (TYLENOL) 650 MG CR tablet Take 650 mg by mouth as needed for pain.   Cholecalciferol (VITAMIN D-3 PO) Take 2,000 Units by mouth daily.   Cyanocobalamin-Methylcobalamin 600-600 MCG SUBL cyanocobalamin-methylcobalamin 600 mcg-600 mcg sublingual tablet  Place by sublingual route.   diclofenac Sodium (VOLTAREN) 1 % GEL Apply topically 4 (four) times daily.   fexofenadine (ALLEGRA) 180 MG tablet Take 180 mg by mouth daily.   fluocinonide cream (LIDEX) 0.05 % fluocinonide 0.05 % topical cream  APPLY TO THE AFFECTED AREA(S) BY TOPICAL ROUTE 2 TIMES PER DAY   ibuprofen (ADVIL) 200 MG tablet daily as needed.   ketoconazole (NIZORAL) 2 % cream Apply 1 application topically daily.   naproxen sodium (ALEVE) 220 MG tablet Take 220 mg by mouth as needed.   nystatin ointment (MYCOSTATIN) nystatin 100,000 unit/gram topical ointment  Apply 1 application by topical route.   thyroid (ARMOUR THYROID) 60 MG tablet TAKE ONE TABLET BY MOUTH DAILY BEFORE BREAKFAST   Current Facility-Administered Medications for the 03/07/21 encounter (Office Visit) with Caren Macadam, MD  Medication   0.9 %  sodium chloride infusion    Review of Systems  Constitutional:  Negative for activity change, appetite change, chills, fatigue, fever and unexpected weight change (gaining).  HENT:  Negative for congestion, ear pain, hearing loss, sinus pressure, sinus pain, sore throat and trouble swallowing.   Eyes:  Negative for pain and visual disturbance.  Respiratory:  Negative for cough, chest tightness, shortness of breath and  wheezing.   Cardiovascular:  Negative for chest pain, palpitations and leg swelling.  Gastrointestinal:  Negative for abdominal pain, blood in stool, constipation, diarrhea, nausea and vomiting.  Genitourinary:  Negative for difficulty urinating and menstrual problem.  Musculoskeletal:  Negative for arthralgias and back pain.  Skin:  Negative for rash.  Neurological:  Negative for dizziness, weakness, numbness and headaches.  Hematological:  Negative for adenopathy. Does not bruise/bleed easily.  Psychiatric/Behavioral:  Negative for sleep disturbance and suicidal ideas. The patient is not nervous/anxious.    Objective:  BP 130/80 (BP Location: Left Arm, Patient Position: Sitting, Cuff Size: Large)   Pulse 76   Temp 98 F (36.7 C) (Oral)   Ht 5\' 6"  (1.676 m)   Wt 219 lb 3.2 oz (99.4 kg)   SpO2 98%   BMI 35.38 kg/m   Weight: 219 lb 3.2 oz (99.4 kg)   BP Readings from Last 3 Encounters:  03/07/21 130/80  03/21/20 (!) 150/94  03/21/20 130/88   Wt Readings from Last 3 Encounters:  03/07/21 219 lb 3.2 oz (99.4 kg)  03/21/20 216 lb (98 kg)  03/21/20 217 lb 9.6 oz (98.7 kg)    Physical Exam Constitutional:      General: She is not in acute distress.    Appearance: She is well-developed.  HENT:     Head: Normocephalic and atraumatic.     Right Ear: External ear normal.     Left Ear: External ear normal.     Mouth/Throat:     Pharynx: No oropharyngeal exudate.  Eyes:     Conjunctiva/sclera: Conjunctivae normal.     Pupils: Pupils are equal, round, and reactive to light.  Neck:     Thyroid: No thyromegaly.  Cardiovascular:     Rate and Rhythm: Normal rate and regular rhythm.     Heart sounds: Normal heart sounds. No murmur heard.   No friction rub. No gallop.  Pulmonary:     Effort: Pulmonary effort is normal.     Breath sounds: Normal breath sounds.  Abdominal:     General: Bowel sounds are normal. There is no distension.     Palpations: Abdomen is soft. There is no  mass.     Tenderness: There is no abdominal tenderness. There is no guarding.     Hernia: No hernia is present.  Musculoskeletal:        General: No tenderness or deformity. Normal range of motion.     Cervical back: Normal range of motion and neck supple.  Lymphadenopathy:     Cervical: No cervical adenopathy.  Skin:    General: Skin is warm and dry.     Findings: No rash.  Neurological:     Mental Status: She is alert and oriented to person, place, and time.     Deep Tendon Reflexes: Reflexes normal.     Reflex Scores:      Tricep reflexes are 2+ on the right side and 2+ on the left side.      Bicep reflexes are 2+ on the right side and 2+  on the left side.      Brachioradialis reflexes are 2+ on the right side and 2+ on the left side.      Patellar reflexes are 2+ on the right side and 2+ on the left side. Psychiatric:        Speech: Speech normal.        Behavior: Behavior normal.        Thought Content: Thought content normal.    Assessment/Plan  1. Obstructive sleep apnea Continue with cpap.  - CBC with Differential/Platelet; Future  2. Acquired hypothyroidism Managed by endo; will recheck thyroid labs since she is completing bloodwork today.  - TSH; Future - T4, free; Future - T3, free; Future  3. Arthritis Naproxen prn.   4. Rosacea Managed by derm.   5. Vitamin D deficiency Will recheck levels.   6. Hyperlipidemia, unspecified hyperlipidemia type Recheck levels.  - Comprehensive metabolic panel; Future - Lipid panel; Future  7. BREAST CANCER, HX OF Follows with gyn; mammograms through them.   8. B12 deficiency Stable on last bloodwork  9. Elevated fasting glucose - Hemoglobin A1c; Future  Return for pending bloodwork.  45 minutes spent in chart review, time with patient, charting   [name], MD

## 2021-03-08 LAB — COMPREHENSIVE METABOLIC PANEL
ALT: 20 U/L (ref 0–35)
AST: 22 U/L (ref 0–37)
Albumin: 4.4 g/dL (ref 3.5–5.2)
Alkaline Phosphatase: 68 U/L (ref 39–117)
BUN: 15 mg/dL (ref 6–23)
CO2: 29 mEq/L (ref 19–32)
Calcium: 9.4 mg/dL (ref 8.4–10.5)
Chloride: 103 mEq/L (ref 96–112)
Creatinine, Ser: 0.77 mg/dL (ref 0.40–1.20)
GFR: 78.74 mL/min (ref >60.00)
Glucose, Bld: 69 mg/dL — ABNORMAL LOW (ref 70–99)
Potassium: 3.8 mEq/L (ref 3.5–5.1)
Sodium: 140 mEq/L (ref 135–145)
Total Bilirubin: 0.3 mg/dL (ref 0.2–1.2)
Total Protein: 7 g/dL (ref 6.0–8.3)

## 2021-03-08 LAB — CBC WITH DIFFERENTIAL/PLATELET
Basophils Absolute: 0.1 10*3/uL (ref 0.0–0.1)
Basophils Relative: 0.6 % (ref 0.0–3.0)
Eosinophils Absolute: 0.2 10*3/uL (ref 0.0–0.7)
Eosinophils Relative: 2.5 % (ref 0.0–5.0)
HCT: 41.4 % (ref 36.0–46.0)
Hemoglobin: 13.9 g/dL (ref 12.0–15.0)
Lymphocytes Relative: 23 % (ref 12.0–46.0)
Lymphs Abs: 2.1 10*3/uL (ref 0.7–4.0)
MCHC: 33.6 g/dL (ref 30.0–36.0)
MCV: 86.5 fl (ref 78.0–100.0)
Monocytes Absolute: 0.7 10*3/uL (ref 0.1–1.0)
Monocytes Relative: 7.3 % (ref 3.0–12.0)
Neutro Abs: 6.1 10*3/uL (ref 1.4–7.7)
Neutrophils Relative %: 66.6 % (ref 43.0–77.0)
Platelets: 355 10*3/uL (ref 150.0–400.0)
RBC: 4.79 Mil/uL (ref 3.87–5.11)
RDW: 13.6 % (ref 11.5–15.5)
WBC: 9.1 10*3/uL (ref 4.0–10.5)

## 2021-03-08 LAB — LIPID PANEL
Cholesterol: 205 mg/dL — ABNORMAL HIGH (ref 0–200)
HDL: 56.3 mg/dL (ref >39.00)
LDL Cholesterol: 127 mg/dL — ABNORMAL HIGH (ref 0–99)
NonHDL: 149.12
Total CHOL/HDL Ratio: 4
Triglycerides: 111 mg/dL (ref 0.0–149.0)
VLDL: 22.2 mg/dL (ref 0.0–40.0)

## 2021-03-08 LAB — T3, FREE: T3, Free: 3.3 pg/mL (ref 2.3–4.2)

## 2021-03-08 LAB — T4, FREE: Free T4: 0.64 ng/dL (ref 0.60–1.60)

## 2021-03-08 LAB — HEMOGLOBIN A1C: Hgb A1c MFr Bld: 6.1 % (ref 4.6–6.5)

## 2021-03-08 LAB — TSH: TSH: 1.67 u[IU]/mL (ref 0.35–5.50)

## 2021-03-12 DIAGNOSIS — Z124 Encounter for screening for malignant neoplasm of cervix: Secondary | ICD-10-CM | POA: Diagnosis not present

## 2021-03-12 DIAGNOSIS — Z01419 Encounter for gynecological examination (general) (routine) without abnormal findings: Secondary | ICD-10-CM | POA: Diagnosis not present

## 2021-03-12 DIAGNOSIS — Z6835 Body mass index (BMI) 35.0-35.9, adult: Secondary | ICD-10-CM | POA: Diagnosis not present

## 2021-03-27 ENCOUNTER — Other Ambulatory Visit: Payer: Self-pay

## 2021-03-27 ENCOUNTER — Encounter: Payer: Self-pay | Admitting: Internal Medicine

## 2021-03-27 ENCOUNTER — Ambulatory Visit (INDEPENDENT_AMBULATORY_CARE_PROVIDER_SITE_OTHER): Payer: Medicare Other | Admitting: Internal Medicine

## 2021-03-27 VITALS — BP 120/82 | HR 74 | Ht 66.0 in | Wt 217.2 lb

## 2021-03-27 DIAGNOSIS — E538 Deficiency of other specified B group vitamins: Secondary | ICD-10-CM

## 2021-03-27 DIAGNOSIS — M85851 Other specified disorders of bone density and structure, right thigh: Secondary | ICD-10-CM

## 2021-03-27 DIAGNOSIS — E039 Hypothyroidism, unspecified: Secondary | ICD-10-CM

## 2021-03-27 DIAGNOSIS — M85852 Other specified disorders of bone density and structure, left thigh: Secondary | ICD-10-CM | POA: Diagnosis not present

## 2021-03-27 LAB — VITAMIN B12: Vitamin B-12: 497 pg/mL (ref 211–911)

## 2021-03-27 LAB — VITAMIN D 25 HYDROXY (VIT D DEFICIENCY, FRACTURES): VITD: 18.76 ng/mL — ABNORMAL LOW (ref 30.00–100.00)

## 2021-03-27 MED ORDER — ARMOUR THYROID 60 MG PO TABS: ORAL_TABLET | ORAL | 3 refills | Status: DC

## 2021-03-27 NOTE — Progress Notes (Signed)
Patient ID: Jacqueline Jennings, female   DOB: Aug 27, 1950, 70 y.o.   MRN: [id number]  This visit occurred during the SARS-CoV-2 public health emergency.  Safety protocols were in place, including screening questions prior to the visit, additional usage of staff PPE, and extensive cleaning of exam room while observing appropriate contact time as indicated for disinfecting solutions.   HPI  Jacqueline Jennings is a 70 y.o.-year-old female, returning for follow-up for hypothyroidism and B12 deficiency. Last visit 1 year ago.  Interim history: No falls or fractures since last visit. She continues to have joint pains - especially knees.  Reviewed history: Pt. has been dx with hypothyroidism in 2007-2008; started on Synthroid >> not feeling better >> was on Armour >> now Naturethyroid 65 mg.  Her TSH was overly suppressed in the past, as shown below. She was initially on a high dose of Armour: 120 mg, which we subsequently decreased to 90 mg, and then further to 60 mg >> currently on Nature-Throid.  Labs normalized and remained controlled afterwards.  She was on Nature-Throid 65 mg >> now Armour 60 mg daily (due to availability): - in am - fasting - at least 30 min from b'fast - no calcium - no iron - no multivitamins - no PPIs - not on Biotin  Reviewed her TFTs: Lab Results  Component Value Date   TSH 1.67 03/07/2021   TSH 1.88 06/30/2020   TSH 2.17 11/24/2019   TSH 3.18 02/15/2019   TSH 2.48 02/17/2018   TSH 2.88 03/07/2017   TSH 1.60 03/08/2016   TSH 1.87 03/09/2015   TSH 2.39 09/08/2014   TSH 1.67 05/24/2014   FREET4 0.64 03/07/2021   FREET4 0.79 06/30/2020   FREET4 0.75 02/15/2019   FREET4 0.79 02/17/2018   FREET4 0.74 03/07/2017   FREET4 0.62 03/08/2016   FREET4 0.67 03/09/2015   FREET4 0.69 09/08/2014   FREET4 0.79 05/24/2014   FREET4 0.73 03/01/2014    Pt denies: - feeling nodules in neck - hoarseness - dysphagia - choking - SOB with lying down  We also  diagnosed B12 deficiency in the past.  After starting 5000 mcg of methylcobalamin daily, her B12 was higher than target so the dose was decreased to 5000 mcg every other day.  She is currently on 1000 mcg Me-B12 daily and on this dose her B12 levels have been normal.  Review vitamin B12 levels: Lab Results  Component Value Date   VITAMINB12 844 06/30/2020   VITAMINB12 459 11/24/2019   VITAMINB12 608 02/15/2019   VITAMINB12 1,282 (H) 02/17/2018   VITAMINB12 982 (H) 02/14/2017   VITAMINB12 759 06/07/2016   VITAMINB12 298 02/22/2016   VITAMINB12 337 03/09/2015   VITAMINB12 280 12/14/2014   VITAMINB12 210 (L) 09/14/2014   Osteopenia: 12/07/2019 Lumbar spine L1-L4 Femoral neck (FN) 33% distal radius  T-score -0.1 RFN: -1.1 LFN: -1.2 n/a  Change in BMD from previous DXA test (%) +6.4%* -7.0%* n/a  (*) statistically significant   She is on 2000 units vitamin D daily, increase 06/2020.  Vitamin D levels reviewed: Lab Results  Component Value Date   VD25OH 22.57 (L) 06/30/2020   VD25OH 48 01/07/2011   VD25OH 48 07/05/2010   VD25OH 43 08/10/2009   VD25OH 35 05/30/2009   She has a h/o low B12: Lab Results  Component Value Date   VITAMINB12 844 06/30/2020   VITAMINB12 459 11/24/2019   VITAMINB12 608 02/15/2019   VITAMINB12 1,282 (H) 02/17/2018   VITAMINB12 982 (H) 02/14/2017  VITAMINB12 759 06/07/2016   VITAMINB12 298 02/22/2016   VITAMINB12 337 03/09/2015   VITAMINB12 280 12/14/2014   VITAMINB12 210 (L) 09/14/2014  On 1000 mcg daily.  She also has a history of BrCa 2006 >> had radiation therapy to right breast.  She lost her husband in 07/2016 due to esophageal cancer.  She was undergoing grief counseling.   ROS: + see HPI  I reviewed pt's medications, allergies, PMH, social hx, family hx, and changes were documented in the history of present illness. Otherwise, unchanged from my initial visit note.  Past Medical History:  Diagnosis Date   Acute medial meniscal tear  10/02/2011   right   Allergy    seasonal   Arthritis 09-27-11   Right knee/Cortisone injection 2'13   Breast cancer ([place]) 2006   hx of with rediation, surgery and adjuvant theapy with tamoxifen  (year two)   Colon polyps 2014   Hyperlipidemia    Hypothyroidism 09-27-11   Low function   IBS (irritable bowel syndrome)    Irritable bowel syndrome 05/19/2007   Qualifier: Diagnosis of  By: Arnoldo Morale MD, Balinda Quails    NEPHROLITHIASIS, HX OF 11/26/2006   Qualifier: Diagnosis of  By: Jimmye Norman, LPN, Winfield Cunas    OSTEOARTHROS UNSPEC WHETHER GEN/LOC [facility] SITE 02/28/2009   Qualifier: Diagnosis of  By: Arnoldo Morale MD, Balinda Quails    Sleep apnea 09-27-11   Cpap used ,started 4'6270   Past Surgical History:  Procedure Laterality Date   BREAST BIOPSY Right 1977   BREAST LUMPECTOMY Right 2006   raditon 88 tx-right   CATARACT EXTRACTION Bilateral 2008   [place] OF UTERUS  2011   done after thickening of uterine lining from tamoxifen   KNEE ARTHROSCOPY  10/02/2011   Procedure: ARTHROSCOPY KNEE;  Surgeon: Gearlean Alf, MD;  Location: [facility];  Service: Orthopedics;  Laterality: Right;  with Debridement, right knee medial meniscus   KNEE ARTHROSCOPY Left 05/2018   left knee arthroscopy  2020   MYOMECTOMY  1990   TONSILLECTOMY     Social History   Socioeconomic History   Marital status: Widowed    Spouse name: Not on file   Number of children: 0   Years of education: 16   Highest education level: Bachelor's degree (e.g., BA, AB, BS)  Occupational History   Occupation: retired  Tobacco Use   Smoking status: Never   Smokeless tobacco: Never  Vaping Use   Vaping Use: Never used  Substance and Sexual Activity   Alcohol use: Yes    Alcohol/week: 0.0 standard drinks    Comment: rare occ   Drug use: No   Sexual activity: Yes  Other Topics Concern   Not on file  Social History Narrative   Work or School: retired Education officer, museum, travels a lot      widowed      Spiritual  Beliefs: Christian               Social Determinants of Radio broadcast assistant Strain: Low Risk    Difficulty of Paying Living Expenses: Not hard at all  Food Insecurity: No Food Insecurity   Worried About Charity fundraiser in the Last Year: Never true   Arboriculturist in the Last Year: Never true  Transportation Needs: No Transportation Needs   Lack of Transportation (Medical): No   Lack of Transportation (Non-Medical): No  Physical Activity: Sufficiently Active   Days of  Exercise per Week: 3 days   Minutes of Exercise per Session: 60 min  Stress: No Stress Concern Present   Feeling of Stress : Not at all  Social Connections: Moderately Isolated   Frequency of Communication with Friends and Family: More than three times a week   Frequency of Social Gatherings with Friends and Family: More than three times a week   Attends Religious Services: More than 4 times per year   Active Member of [employer] or Organizations: No   Attends Archivist Meetings: Never   Marital Status: Widowed  Human resources officer Violence: Not At Risk   Fear of Current or Ex-Partner: No   Emotionally Abused: No   Physically Abused: No   Sexually Abused: No   Current Outpatient Medications on File Prior to Visit  Medication Sig Dispense Refill   acetaminophen (TYLENOL) 650 MG CR tablet Take 650 mg by mouth as needed for pain.     Cholecalciferol (VITAMIN D-3 PO) Take 2,000 Units by mouth daily.     Cyanocobalamin-Methylcobalamin 600-600 MCG SUBL cyanocobalamin-methylcobalamin 600 mcg-600 mcg sublingual tablet  Place by sublingual route.     diclofenac Sodium (VOLTAREN) 1 % GEL Apply topically 4 (four) times daily.     fexofenadine (ALLEGRA) 180 MG tablet Take 180 mg by mouth daily.     fluocinonide cream (LIDEX) 0.05 % fluocinonide 0.05 % topical cream  APPLY TO THE AFFECTED AREA(S) BY TOPICAL ROUTE 2 TIMES PER DAY     ibuprofen (ADVIL) 200 MG tablet daily as needed.     ketoconazole  (NIZORAL) 2 % cream Apply 1 application topically daily.     naproxen sodium (ALEVE) 220 MG tablet Take 220 mg by mouth as needed.     nitrofurantoin, macrocrystal-monohydrate, (MACROBID) 100 MG capsule Take 1 capsule (100 mg total) by mouth 2 (two) times daily. 10 capsule 0   nystatin ointment (MYCOSTATIN) nystatin 100,000 unit/gram topical ointment  Apply 1 application by topical route.     thyroid (ARMOUR THYROID) 60 MG tablet TAKE ONE TABLET BY MOUTH DAILY BEFORE BREAKFAST 90 tablet 3   Current Facility-Administered Medications on File Prior to Visit  Medication Dose Route Frequency Provider Last Rate Last Admin   0.9 %  sodium chloride infusion  500 mL Intravenous Continuous Ladene Artist, MD       Allergies  Allergen Reactions   Adhesive [Tape]    Amoxicillin    Augmentin [Amoxicillin-Pot Clavulanate] Rash    Drug eruption   Latex Rash    Adhesives products-causes reddness, rash   Nabumetone     REACTION: Rash,Hives   Family History  Problem Relation Age of Onset   Hypertension Mother    Leukemia Mother    Diabetes Father    Uterine cancer Maternal Grandmother    Colon cancer Neg Hx    PE: BP 120/82 (BP Location: Left Arm, Patient Position: Sitting, Cuff Size: Normal)   Pulse 74   Ht 5' 6"  (1.676 m)   Wt 217 lb 3.2 oz (98.5 kg)   SpO2 97%   BMI 35.06 kg/m   Wt Readings from Last 3 Encounters:  03/27/21 217 lb 3.2 oz (98.5 kg)  03/07/21 219 lb 3.2 oz (99.4 kg)  03/21/20 216 lb (98 kg)   Constitutional: overweight, in NAD Eyes: PERRLA, EOMI, no exophthalmos ENT: moist mucous membranes, no thyromegaly, no cervical lymphadenopathy Cardiovascular: RRR, No MRG Respiratory: CTA B Musculoskeletal: no deformities, strength intact in all 4 Skin: moist, warm, no rashes Neurological: no  tremor with outstretched hands, DTR normal in all 4  ASSESSMENT: 1. Hypothyroidism - on desiccated thyroid extract  2. B12 def  3. Osteopenia  PLAN:  1. Patient with  longstanding hypothyroidism, prev. on Nature-Throid >> Armour 60 now - latest thyroid labs reviewed with pt. >> normal: Lab Results  Component Value Date   TSH 1.67 03/07/2021  - she continues on Armour 60 mg daily - pt feels good on this dose, but still has fatigue and inability to lose weight - she asks me my opinion about Saxenda and Mounjaro - discussed that these are safe and efficient - we discussed about taking the thyroid hormone every day, with water, >30 minutes before breakfast, separated by >4 hours from acid reflux medications, calcium, iron, multivitamins. Pt. is taking it correctly.  2. B12 def -Previously on 5000 mcg B12 daily, then decrease to 5000 mcg every other day and then on 1000 mcg daily -No fatigue, numbness and tingling, anemia -B12 level normalized-844 in 06/2020 -We will recheck the level today  3. Osteopenia -No falls or fractures since last visit -Reviewed latest bone density from 2021: Her femoral neck BMD in the mild osteopenic range -At that time, we discussed that no treatment was needed, however, we had to make sure that the serum calcium level stays normal.  I recommended to get ~1200 milligrams of calcium daily from the diet but she could supplement if she did not think that she got this much in the regular day.  We also discussed that we need to keep her normal vitamin D (she is on supplementation).  And I recommended exercise.  She had joint pains and Achilles tendinitis and could not exercise but I advised her to at least do chair exercises. -We reviewed together her vitamin D level, which was low in 06/2020 when I suggested an increase in dose from 1000 to 2000 units daily: Lab Results  Component Value Date   VD25OH 22.57 (L) 06/30/2020   VD25OH 48 01/07/2011  - we will recheck her vitamin D level today  Component     Latest Ref Rng & Units 03/27/2021  Vitamin B12     211 - 911 pg/mL 497  VITD     30.00 - 100.00 ng/mL 18.76 (L)  Vitamin B12 is  normal, but vitamin D is lower than before.  I suspect that she is missing vitamin D doses.  I will advise her to try to increase the dose to 4000 units daily and will need to repeat the test in 2 months.  Philemon Kingdom, MD PhD [facility]

## 2021-03-27 NOTE — Patient Instructions (Signed)
Please continue Armour 60 mg daily.  Take the thyroid hormones every day, with water, at least 30 minutes before breakfast, separated by at least 4 hours from: - acid reflux medications - calcium - iron - multivitamins  Please come back for a follow-up appointment in 1 year.

## 2021-03-28 ENCOUNTER — Encounter: Payer: Self-pay | Admitting: Internal Medicine

## 2021-03-28 ENCOUNTER — Encounter: Payer: Self-pay | Admitting: Family Medicine

## 2021-03-28 DIAGNOSIS — E559 Vitamin D deficiency, unspecified: Secondary | ICD-10-CM

## 2021-04-09 NOTE — Telephone Encounter (Signed)
With her medicare plan, they do not cover Mounjaro right now so that isn't an option. She would not be able to get coverage of Saxenda with Medicare either. I think your only option may be Ozempic (or Rybelsus) with her having pre-diabetes and see if she can call around to find a pharmacy that carries it.

## 2021-05-29 ENCOUNTER — Encounter: Payer: Self-pay | Admitting: Internal Medicine

## 2021-05-29 ENCOUNTER — Other Ambulatory Visit (INDEPENDENT_AMBULATORY_CARE_PROVIDER_SITE_OTHER): Payer: Medicare Other

## 2021-05-29 DIAGNOSIS — E559 Vitamin D deficiency, unspecified: Secondary | ICD-10-CM | POA: Diagnosis not present

## 2021-05-29 LAB — VITAMIN D 25 HYDROXY (VIT D DEFICIENCY, FRACTURES): VITD: 21.43 ng/mL — ABNORMAL LOW (ref 30.00–100.00)

## 2021-05-30 ENCOUNTER — Other Ambulatory Visit: Payer: Self-pay | Admitting: Internal Medicine

## 2021-05-30 DIAGNOSIS — E559 Vitamin D deficiency, unspecified: Secondary | ICD-10-CM

## 2021-06-06 ENCOUNTER — Ambulatory Visit: Payer: Medicare Other

## 2021-06-22 ENCOUNTER — Telehealth: Payer: Self-pay | Admitting: Family Medicine

## 2021-06-22 ENCOUNTER — Encounter: Payer: Self-pay | Admitting: Family Medicine

## 2021-06-22 DIAGNOSIS — N390 Urinary tract infection, site not specified: Secondary | ICD-10-CM

## 2021-06-22 DIAGNOSIS — R300 Dysuria: Secondary | ICD-10-CM

## 2021-06-22 NOTE — Telephone Encounter (Signed)
ok 

## 2021-06-22 NOTE — Telephone Encounter (Signed)
Pt requesting toc from Dr. Ethlyn Gallery to Dr. Sharlet Salina ? ?Ok to schedule? ?

## 2021-06-27 ENCOUNTER — Telehealth: Payer: Self-pay | Admitting: Family Medicine

## 2021-06-27 NOTE — Telephone Encounter (Signed)
Left message for patient to call back and schedule Medicare Annual Wellness Visit (AWV) either virtually or in office. Left  my jabber number [phone]   Last AWV 05/30/20  please schedule at anytime with [facility] Nurse Health Advisor 1 or 2   This should be a 45 minute visit.  

## 2021-06-28 MED ORDER — NITROFURANTOIN MONOHYD MACRO 100 MG PO CAPS: 100.0000 mg | ORAL_CAPSULE | Freq: Two times a day (BID) | ORAL | 2 refills | Status: DC

## 2021-06-28 NOTE — Telephone Encounter (Signed)
Given that provider is leaving office she should consider the replacing provider at that office or another provider at that location first. if unable to find a provider at that office okay for transfer. ?

## 2021-06-28 NOTE — Telephone Encounter (Signed)
LVM for pt w/ Dr. Nathanial Millman recommendations ?

## 2021-07-11 ENCOUNTER — Ambulatory Visit (INDEPENDENT_AMBULATORY_CARE_PROVIDER_SITE_OTHER): Payer: Medicare Other | Admitting: Family Medicine

## 2021-07-11 ENCOUNTER — Encounter: Payer: Self-pay | Admitting: Family Medicine

## 2021-07-11 VITALS — BP 132/82 | HR 80 | Temp 98.0°F | Ht 66.0 in | Wt 204.9 lb

## 2021-07-11 DIAGNOSIS — R300 Dysuria: Secondary | ICD-10-CM | POA: Diagnosis not present

## 2021-07-11 LAB — POC URINALSYSI DIPSTICK (AUTOMATED)
Bilirubin, UA: NEGATIVE
Blood, UA: POSITIVE
Glucose, UA: NEGATIVE
Ketones, UA: NEGATIVE
Nitrite, UA: NEGATIVE
Protein, UA: POSITIVE — AB
Spec Grav, UA: 1.02 (ref 1.010–1.025)
Urobilinogen, UA: 0.2 E.U./dL
pH, UA: 6 (ref 5.0–8.0)

## 2021-07-11 NOTE — Addendum Note (Signed)
Addended by: Nilda Riggs on: 07/11/2021 09:29 AM ? ? Modules accepted: Orders ? ?

## 2021-07-11 NOTE — Patient Instructions (Signed)
Go ahead and start the Nitrofurantoin one twice daily   ?

## 2021-07-11 NOTE — Progress Notes (Signed)
? ?Established Patient Office Visit ? ?Subjective:  ?Patient ID: Jacqueline Jennings, female    DOB: Jun 04, 1950  Age: 71 y.o. MRN: [id number] ? ?CC:  ?Chief Complaint  ?Patient presents with  ? Urinary Tract Infection  ?  X1 day  ? Dysuria  ?  X1 day  ? ? ?HPI ?Jacqueline Jennings presents for few day history of some burning with urination and urinary frequency.  No fever.  No chills.  No gross hematuria.  No flank pain.  No nausea or vomiting.  She has noticed that her urine looks somewhat cloudy.  Allergy to penicillin.  No anaphylaxis.  No recent confirmed UTI.  No recent hospitalization or instrumentation. ? ?Past Medical History:  ?Diagnosis Date  ? Acute medial meniscal tear 10/02/2011  ? right  ? Allergy   ? seasonal  ? Arthritis 09-27-11  ? Right knee/Cortisone injection 2'13  ? Breast cancer ([place]) 2006  ? hx of with rediation, surgery and adjuvant theapy with tamoxifen  (year two)  ? Colon polyps 2014  ? Hyperlipidemia   ? Hypothyroidism 09-27-11  ? Low function  ? IBS (irritable bowel syndrome)   ? Irritable bowel syndrome 05/19/2007  ? Qualifier: Diagnosis of  By: Arnoldo Morale MD, Balinda Quails   ? NEPHROLITHIASIS, HX OF 11/26/2006  ? Qualifier: Diagnosis of  By: Jimmye Norman, LPN, Winfield Cunas   ? OSTEOARTHROS UNSPEC WHETHER GEN/LOC [facility] SITE 02/28/2009  ? Qualifier: Diagnosis of  By: Arnoldo Morale MD, Balinda Quails   ? Sleep apnea 09-27-11  ? Cpap used ,started 2'2011  ? ? ?Past Surgical History:  ?Procedure Laterality Date  ? BREAST BIOPSY Right 1977  ? BREAST LUMPECTOMY Right 2006  ? raditon 33 tx-right  ? CATARACT EXTRACTION Bilateral 2008  ? CYSTOSCOPY  1977  ? DILATION AND CURETTAGE OF UTERUS  2011  ? done after thickening of uterine lining from tamoxifen  ? KNEE ARTHROSCOPY  10/02/2011  ? Procedure: ARTHROSCOPY KNEE;  Surgeon: Gearlean Alf, MD;  Location: [facility];  Service: Orthopedics;  Laterality: Right;  with Debridement, right knee medial meniscus  ? KNEE ARTHROSCOPY Left 05/2018  ? left knee arthroscopy  2020  ? MYOMECTOMY  1990  ?  TONSILLECTOMY    ? ? ?Family History  ?Problem Relation Age of Onset  ? Hypertension Mother   ? Leukemia Mother   ? Diabetes Father   ? Uterine cancer Maternal Grandmother   ? Colon cancer Neg Hx   ? ? ?Social History  ? ?Socioeconomic History  ? Marital status: Widowed  ?  Spouse name: Not on file  ? Number of children: 0  ? Years of education: 67  ? Highest education level: Bachelor's degree (e.g., BA, AB, BS)  ?Occupational History  ? Occupation: retired  ?Tobacco Use  ? Smoking status: Never  ? Smokeless tobacco: Never  ?Vaping Use  ? Vaping Use: Never used  ?Substance and Sexual Activity  ? Alcohol use: Yes  ?  Alcohol/week: 0.0 standard drinks  ?  Comment: rare occ  ? Drug use: No  ? Sexual activity: Yes  ?Other Topics Concern  ? Not on file  ?Social History Narrative  ? Work or School: retired Education officer, museum, travels a lot  ?   ? widowed  ?   ? Spiritual Beliefs: Darrick Meigs  ?   ?   ?   ?   ? ?Social Determinants of Health  ? ?Financial Resource Strain: Not on file  ?Food Insecurity: Not on file  ?Transportation  Needs: Not on file  ?Physical Activity: Not on file  ?Stress: Not on file  ?Social Connections: Not on file  ?Intimate Partner Violence: Not on file  ? ? ?Outpatient Medications Prior to Visit  ?Medication Sig Dispense Refill  ? acetaminophen (TYLENOL) 650 MG CR tablet Take 650 mg by mouth as needed for pain.    ? ARMOUR THYROID 60 MG tablet TAKE ONE TABLET BY MOUTH DAILY BEFORE BREAKFAST 90 tablet 3  ? Cholecalciferol (VITAMIN D-3 PO) Take 2,000 Units by mouth daily.    ? Cyanocobalamin-Methylcobalamin 600-600 MCG SUBL cyanocobalamin-methylcobalamin 600 mcg-600 mcg sublingual tablet ? Place by sublingual route.    ? diclofenac Sodium (VOLTAREN) 1 % GEL Apply topically 4 (four) times daily.    ? fexofenadine (ALLEGRA) 180 MG tablet Take 180 mg by mouth daily.    ? fluocinonide cream (LIDEX) 0.05 % fluocinonide 0.05 % topical cream ? APPLY TO THE AFFECTED AREA(S) BY TOPICAL ROUTE 2 TIMES PER DAY    ?  ibuprofen (ADVIL) 200 MG tablet daily as needed.    ? ketoconazole (NIZORAL) 2 % cream Apply 1 application topically daily.    ? naproxen sodium (ALEVE) 220 MG tablet Take 220 mg by mouth as needed.    ? nitrofurantoin, macrocrystal-monohydrate, (MACROBID) 100 MG capsule Take 1 capsule (100 mg total) by mouth 2 (two) times daily. 10 capsule 2  ? nystatin ointment (MYCOSTATIN) nystatin 100,000 unit/gram topical ointment ? Apply 1 application by topical route.    ? ?Facility-Administered Medications Prior to Visit  ?Medication Dose Route Frequency Provider Last Rate Last Admin  ? 0.9 %  sodium chloride infusion  500 mL Intravenous Continuous Ladene Artist, MD      ? ? ?Allergies  ?Allergen Reactions  ? Adhesive [Tape]   ? Amoxicillin   ? Augmentin [Amoxicillin-Pot Clavulanate] Rash  ?  Drug eruption  ? Latex Rash  ?  Adhesives products-causes reddness, rash  ? Nabumetone   ?  REACTION: Rash,Hives  ? ? ?ROS ?Review of Systems  ?Constitutional:  Negative for appetite change, chills and fever.  ?Gastrointestinal:  Negative for abdominal pain, constipation, diarrhea, nausea and vomiting.  ?Genitourinary:  Positive for dysuria and frequency.  ?Musculoskeletal:  Negative for back pain.  ?Neurological:  Negative for dizziness.  ? ?  ?Objective:  ?  ?Physical Exam ?Constitutional:   ?   Appearance: She is well-developed.  ?HENT:  ?   Head: Normocephalic and atraumatic.  ?Neck:  ?   Thyroid: No thyromegaly.  ?Cardiovascular:  ?   Rate and Rhythm: Normal rate and regular rhythm.  ?   Heart sounds: Normal heart sounds.  ?Pulmonary:  ?   Breath sounds: Normal breath sounds.  ?Abdominal:  ?   General: Bowel sounds are normal.  ?   Palpations: Abdomen is soft.  ?   Tenderness: There is no abdominal tenderness.  ?Musculoskeletal:  ?   Cervical back: Neck supple.  ? ? ?BP 132/82 (BP Location: Left Arm, Cuff Size: Normal)   Pulse 80   Temp 98 ?F (36.7 ?C) (Oral)   Ht '5\' 6"'$  (1.676 m)   Wt 204 lb 14.4 oz (92.9 kg)   SpO2 97%    BMI 33.07 kg/m?  ?Wt Readings from Last 3 Encounters:  ?07/11/21 204 lb 14.4 oz (92.9 kg)  ?03/27/21 217 lb 3.2 oz (98.5 kg)  ?03/07/21 219 lb 3.2 oz (99.4 kg)  ? ? ? ?Health Maintenance Due  ?Topic Date Due  ? COVID-19 Vaccine (4 -  Booster for [employer] series) 04/01/2020  ? ? ?There are no preventive care reminders to display for this patient. ? ?Lab Results  ?Component Value Date  ? TSH 1.67 03/07/2021  ? ?Lab Results  ?Component Value Date  ? WBC 9.1 03/07/2021  ? HGB 13.9 03/07/2021  ? HCT 41.4 03/07/2021  ? MCV 86.5 03/07/2021  ? PLT 355.0 03/07/2021  ? ?Lab Results  ?Component Value Date  ? NA 140 03/07/2021  ? K 3.8 03/07/2021  ? CO2 29 03/07/2021  ? GLUCOSE 69 (L) 03/07/2021  ? BUN 15 03/07/2021  ? CREATININE 0.77 03/07/2021  ? BILITOT 0.3 03/07/2021  ? ALKPHOS 68 03/07/2021  ? AST 22 03/07/2021  ? ALT 20 03/07/2021  ? PROT 7.0 03/07/2021  ? ALBUMIN 4.4 03/07/2021  ? CALCIUM 9.4 03/07/2021  ? GFR 78.74 03/07/2021  ? ?Lab Results  ?Component Value Date  ? CHOL 205 (H) 03/07/2021  ? ?Lab Results  ?Component Value Date  ? HDL 56.30 03/07/2021  ? ?Lab Results  ?Component Value Date  ? LDLCALC 127 (H) 03/07/2021  ? ?Lab Results  ?Component Value Date  ? TRIG 111.0 03/07/2021  ? ?Lab Results  ?Component Value Date  ? CHOLHDL 4 03/07/2021  ? ?Lab Results  ?Component Value Date  ? HGBA1C 6.1 03/07/2021  ? ? ?  ?Assessment & Plan:  ? ?-Urine dipstick suggests probable UTI ?-Urine culture sent ?-Start Macrobid 100 mg by mouth twice daily for 5 days.  She already has prescription at home.  She has tolerated this well in the past ?-Follow-up for any persistent or worsening symptoms. ?-Stressed importance of good hydration ? ?No orders of the defined types were placed in this encounter. ? ? ?Follow-up: No follow-ups on file.  ? ? ?Carolann Littler, MD ?

## 2021-07-13 LAB — URINE CULTURE
MICRO NUMBER:: 13164759
SPECIMEN QUALITY:: ADEQUATE

## 2021-08-02 ENCOUNTER — Other Ambulatory Visit (INDEPENDENT_AMBULATORY_CARE_PROVIDER_SITE_OTHER): Payer: Medicare Other

## 2021-08-02 ENCOUNTER — Encounter: Payer: Self-pay | Admitting: Internal Medicine

## 2021-08-02 DIAGNOSIS — E559 Vitamin D deficiency, unspecified: Secondary | ICD-10-CM

## 2021-08-02 LAB — VITAMIN D 25 HYDROXY (VIT D DEFICIENCY, FRACTURES): VITD: 22.93 ng/mL — ABNORMAL LOW (ref 30.00–100.00)

## 2021-08-03 ENCOUNTER — Other Ambulatory Visit: Payer: Self-pay | Admitting: Internal Medicine

## 2021-08-03 DIAGNOSIS — E559 Vitamin D deficiency, unspecified: Secondary | ICD-10-CM

## 2021-08-03 NOTE — Progress Notes (Signed)
Vitamin D

## 2021-08-08 DIAGNOSIS — Z20822 Contact with and (suspected) exposure to covid-19: Secondary | ICD-10-CM | POA: Diagnosis not present

## 2021-08-15 ENCOUNTER — Encounter: Payer: Self-pay | Admitting: Family Medicine

## 2021-08-15 ENCOUNTER — Ambulatory Visit (INDEPENDENT_AMBULATORY_CARE_PROVIDER_SITE_OTHER): Payer: Medicare Other | Admitting: Family Medicine

## 2021-08-15 VITALS — BP 132/80 | HR 75 | Temp 97.7°F | Ht 66.0 in | Wt 204.9 lb

## 2021-08-15 DIAGNOSIS — R7301 Impaired fasting glucose: Secondary | ICD-10-CM | POA: Diagnosis not present

## 2021-08-15 DIAGNOSIS — E559 Vitamin D deficiency, unspecified: Secondary | ICD-10-CM | POA: Diagnosis not present

## 2021-08-15 DIAGNOSIS — E538 Deficiency of other specified B group vitamins: Secondary | ICD-10-CM

## 2021-08-15 DIAGNOSIS — G4733 Obstructive sleep apnea (adult) (pediatric): Secondary | ICD-10-CM | POA: Diagnosis not present

## 2021-08-15 DIAGNOSIS — R252 Cramp and spasm: Secondary | ICD-10-CM

## 2021-08-15 DIAGNOSIS — R030 Elevated blood-pressure reading, without diagnosis of hypertension: Secondary | ICD-10-CM | POA: Diagnosis not present

## 2021-08-15 DIAGNOSIS — E785 Hyperlipidemia, unspecified: Secondary | ICD-10-CM

## 2021-08-15 DIAGNOSIS — E039 Hypothyroidism, unspecified: Secondary | ICD-10-CM | POA: Diagnosis not present

## 2021-08-15 DIAGNOSIS — M2550 Pain in unspecified joint: Secondary | ICD-10-CM

## 2021-08-15 LAB — CBC WITH DIFFERENTIAL/PLATELET
Basophils Absolute: 0 10*3/uL (ref 0.0–0.1)
Basophils Relative: 0.5 % (ref 0.0–3.0)
Eosinophils Absolute: 0.1 10*3/uL (ref 0.0–0.7)
Eosinophils Relative: 2.5 % (ref 0.0–5.0)
HCT: 41.8 % (ref 36.0–46.0)
Hemoglobin: 13.9 g/dL (ref 12.0–15.0)
Lymphocytes Relative: 25.6 % (ref 12.0–46.0)
Lymphs Abs: 1.5 10*3/uL (ref 0.7–4.0)
MCHC: 33.3 g/dL (ref 30.0–36.0)
MCV: 87.5 fl (ref 78.0–100.0)
Monocytes Absolute: 0.5 10*3/uL (ref 0.1–1.0)
Monocytes Relative: 8.6 % (ref 3.0–12.0)
Neutro Abs: 3.7 10*3/uL (ref 1.4–7.7)
Neutrophils Relative %: 62.8 % (ref 43.0–77.0)
Platelets: 332 10*3/uL (ref 150.0–400.0)
RBC: 4.78 Mil/uL (ref 3.87–5.11)
RDW: 14.3 % (ref 11.5–15.5)
WBC: 5.9 10*3/uL (ref 4.0–10.5)

## 2021-08-15 LAB — HEMOGLOBIN A1C: Hgb A1c MFr Bld: 5.9 % (ref 4.6–6.5)

## 2021-08-15 LAB — COMPREHENSIVE METABOLIC PANEL
ALT: 14 U/L (ref 0–35)
AST: 15 U/L (ref 0–37)
Albumin: 4.3 g/dL (ref 3.5–5.2)
Alkaline Phosphatase: 61 U/L (ref 39–117)
BUN: 16 mg/dL (ref 6–23)
CO2: 30 mEq/L (ref 19–32)
Calcium: 9.6 mg/dL (ref 8.4–10.5)
Chloride: 103 mEq/L (ref 96–112)
Creatinine, Ser: 0.7 mg/dL (ref 0.40–1.20)
GFR: 88.01 mL/min (ref >60.00)
Glucose, Bld: 93 mg/dL (ref 70–99)
Potassium: 4.2 mEq/L (ref 3.5–5.1)
Sodium: 140 mEq/L (ref 135–145)
Total Bilirubin: 0.4 mg/dL (ref 0.2–1.2)
Total Protein: 6.7 g/dL (ref 6.0–8.3)

## 2021-08-15 LAB — LIPID PANEL
Cholesterol: 194 mg/dL (ref 0–200)
HDL: 57 mg/dL (ref >39.00)
LDL Cholesterol: 111 mg/dL — ABNORMAL HIGH (ref 0–99)
NonHDL: 137.28
Total CHOL/HDL Ratio: 3
Triglycerides: 133 mg/dL (ref 0.0–149.0)
VLDL: 26.6 mg/dL (ref 0.0–40.0)

## 2021-08-15 LAB — TSH: TSH: 2.35 u[IU]/mL (ref 0.35–5.50)

## 2021-08-15 LAB — VITAMIN B12: Vitamin B-12: 512 pg/mL (ref 211–911)

## 2021-08-15 LAB — VITAMIN D 25 HYDROXY (VIT D DEFICIENCY, FRACTURES): VITD: 25.25 ng/mL — ABNORMAL LOW (ref 30.00–100.00)

## 2021-08-15 LAB — SEDIMENTATION RATE: Sed Rate: 16 mm/hr (ref 0–30)

## 2021-08-15 LAB — FOLATE: Folate: 13.1 ng/mL (ref >5.9)

## 2021-08-15 LAB — MAGNESIUM: Magnesium: 2 mg/dL (ref 1.5–2.5)

## 2021-08-15 NOTE — Progress Notes (Signed)
?Jacqueline Jennings ?DOB: 1950-09-02 ?Encounter date: 08/15/2021 ? ?This is a 71 y.o. female who presents with ?Chief Complaint  ?Patient presents with  ? Follow-up  ? ? ?History of present illness: ?Last visit with me was 03/07/2021. Home cuff: 155/87 here today. 143/83, 139/81 right after MA reading of 132/80.  ? ?Blood pressure has been running high at home. Has been charting at home. Bottom number always above 80/85. Lowest reading she got was 78. Has been having knee pain and getting cortisone shots next week. Does love salty snacks. Not drinking as much water with all the driving she is doing. No increased thirst or urination.actually urinating less. ? ?Also has a lot of pain in lower legs; closer to ankles. Has also had more leg cramping. States though that she hasn't been taking as good of care of herself because of caring for elderly aunt- driving a lot and not eating as healthy. Not getting swelling in the legs, just more aching. Knees are really doing badly. In past nsaids have elevated blood pressure.  ? ? ?Sleep apnea: wears cpap nightly.  ?Hypothyroid: Follows with endocrinology. ?Hyperlipidemia: diet controlled ?Follows with GYN for mammograms and routine care. ?B12 deficiency, vitamin D deficiency: she was recently increased to 6000 units with vitamin D; just running low even with supplementation. Hasn't been taking the B12 regularly.  ?Impaired fasting glucose with A1C of 6.1 5 months ago.  ? ?Follows regularly with dermatology. ? ? ?Allergies  ?Allergen Reactions  ? Adhesive [Tape]   ? Amoxicillin   ? Augmentin [Amoxicillin-Pot Clavulanate] Rash  ?  Drug eruption  ? Latex Rash  ?  Adhesives products-causes reddness, rash  ? Nabumetone   ?  REACTION: Rash,Hives  ? ?Current Meds  ?Medication Sig  ? acetaminophen (TYLENOL) 650 MG CR tablet Take 650 mg by mouth as needed for pain.  ? ARMOUR THYROID 60 MG tablet TAKE ONE TABLET BY MOUTH DAILY BEFORE BREAKFAST  ? BIOTIN PO Take 10,000 mcg by mouth daily.   ? Cholecalciferol (VITAMIN D-3 PO) Take 6,000 Units by mouth daily.  ? Cyanocobalamin-Methylcobalamin 600-600 MCG SUBL cyanocobalamin-methylcobalamin 600 mcg-600 mcg sublingual tablet ? Place by sublingual route.  ? fexofenadine (ALLEGRA) 180 MG tablet Take 180 mg by mouth daily.  ? fluocinonide cream (LIDEX) 0.05 % fluocinonide 0.05 % topical cream ? APPLY TO THE AFFECTED AREA(S) BY TOPICAL ROUTE 2 TIMES PER DAY  ? ibuprofen (ADVIL) 200 MG tablet daily as needed.  ? ketoconazole (NIZORAL) 2 % cream Apply 1 application topically daily.  ? Misc Natural Products (OSTEO BI-FLEX ADV JOINT SHIELD PO) Take 2 Units by mouth daily.  ? nystatin ointment (MYCOSTATIN) nystatin 100,000 unit/gram topical ointment ? Apply 1 application by topical route.  ? ?Current Facility-Administered Medications for the 08/15/21 encounter (Office Visit) with Caren Macadam, MD  ?Medication  ? 0.9 %  sodium chloride infusion  ? ? ?Review of Systems  ?Constitutional:  Negative for chills, fatigue and fever.  ?Respiratory:  Negative for cough, chest tightness, shortness of breath and wheezing.   ?Cardiovascular:  Negative for chest pain, palpitations and leg swelling.  ? ?Objective: ? ?BP 132/80 (BP Location: Left Arm, Patient Position: Sitting, Cuff Size: Large)   Pulse 75   Temp 97.7 ?F (36.5 ?C) (Oral)   Ht '5\' 6"'$  (1.676 m)   Wt 204 lb 14.4 oz (92.9 kg)   SpO2 98%   BMI 33.07 kg/m?   Weight: 204 lb 14.4 oz (92.9 kg)  ? ?BP Readings from  Last 3 Encounters:  ?08/15/21 132/80  ?07/11/21 132/82  ?03/27/21 120/82  ? ?Wt Readings from Last 3 Encounters:  ?08/15/21 204 lb 14.4 oz (92.9 kg)  ?07/11/21 204 lb 14.4 oz (92.9 kg)  ?03/27/21 217 lb 3.2 oz (98.5 kg)  ? ? ?Physical Exam ?Constitutional:   ?   General: She is not in acute distress. ?   Appearance: She is well-developed.  ?Cardiovascular:  ?   Rate and Rhythm: Normal rate and regular rhythm.  ?   Heart sounds: Normal heart sounds. No murmur heard. ?  No friction rub.  ?Pulmonary:  ?    Effort: Pulmonary effort is normal. No respiratory distress.  ?   Breath sounds: Normal breath sounds. No wheezing or rales.  ?Musculoskeletal:  ?   Right lower leg: No edema.  ?   Left lower leg: No edema.  ?Neurological:  ?   Mental Status: She is alert and oriented to person, place, and time.  ?Psychiatric:     ?   Behavior: Behavior normal.  ? ? ?Assessment/Plan ? ?1. Leg cramps ?- Magnesium; Future ? ?2. Impaired fasting glucose ?- Hemoglobin A1c; Future ? ?3. Obstructive sleep apnea ?Continue with cpap.  ? ?4. Acquired hypothyroidism ?Recheck levels.  ?- TSH; Future ? ?5. Vitamin D deficiency ?Continue with supplementation through endo.  ? ?6. Hyperlipidemia, unspecified hyperlipidemia type ?- Lipid panel; Future ? ?7. B12 deficiency ?- Vitamin B12; Future ?- Folate; Future ? ?8. Arthralgia, unspecified joint ?- Sedimentation rate; Future ? ?9. Elevated blood pressure reading ?Monitor at home on low salt diet and update me in 2 weeks time.  ?- CBC with Differential/Platelet; Future ?- Comprehensive metabolic panel; Future ? ? ?Return for pending lab or imaging results. ? ? ? ? ? ?[name], MD ?

## 2021-08-15 NOTE — Patient Instructions (Signed)
Update me in 1-2 weeks with blood pressure numbers (no more than twice daily).  ?

## 2021-08-23 DIAGNOSIS — M1711 Unilateral primary osteoarthritis, right knee: Secondary | ICD-10-CM | POA: Diagnosis not present

## 2021-08-23 DIAGNOSIS — M1712 Unilateral primary osteoarthritis, left knee: Secondary | ICD-10-CM | POA: Diagnosis not present

## 2021-08-23 DIAGNOSIS — M170 Bilateral primary osteoarthritis of knee: Secondary | ICD-10-CM | POA: Diagnosis not present

## 2021-09-05 ENCOUNTER — Encounter: Payer: Self-pay | Admitting: Family Medicine

## 2021-09-07 MED ORDER — AMLODIPINE BESYLATE 2.5 MG PO TABS: 2.5000 mg | ORAL_TABLET | Freq: Every day | ORAL | 1 refills | Status: DC

## 2021-09-26 DIAGNOSIS — M25572 Pain in left ankle and joints of left foot: Secondary | ICD-10-CM | POA: Diagnosis not present

## 2021-09-26 DIAGNOSIS — M25562 Pain in left knee: Secondary | ICD-10-CM | POA: Diagnosis not present

## 2021-11-26 DIAGNOSIS — M170 Bilateral primary osteoarthritis of knee: Secondary | ICD-10-CM | POA: Diagnosis not present

## 2021-12-10 DIAGNOSIS — Z1231 Encounter for screening mammogram for malignant neoplasm of breast: Secondary | ICD-10-CM | POA: Diagnosis not present

## 2021-12-11 DIAGNOSIS — H43813 Vitreous degeneration, bilateral: Secondary | ICD-10-CM | POA: Diagnosis not present

## 2021-12-11 DIAGNOSIS — H04121 Dry eye syndrome of right lacrimal gland: Secondary | ICD-10-CM | POA: Diagnosis not present

## 2021-12-11 DIAGNOSIS — H0100A Unspecified blepharitis right eye, upper and lower eyelids: Secondary | ICD-10-CM | POA: Diagnosis not present

## 2021-12-11 DIAGNOSIS — H0100B Unspecified blepharitis left eye, upper and lower eyelids: Secondary | ICD-10-CM | POA: Diagnosis not present

## 2021-12-11 DIAGNOSIS — Z961 Presence of intraocular lens: Secondary | ICD-10-CM | POA: Diagnosis not present

## 2021-12-11 DIAGNOSIS — H524 Presbyopia: Secondary | ICD-10-CM | POA: Diagnosis not present

## 2021-12-11 DIAGNOSIS — H5213 Myopia, bilateral: Secondary | ICD-10-CM | POA: Diagnosis not present

## 2021-12-11 DIAGNOSIS — H52203 Unspecified astigmatism, bilateral: Secondary | ICD-10-CM | POA: Diagnosis not present

## 2021-12-19 DIAGNOSIS — H04121 Dry eye syndrome of right lacrimal gland: Secondary | ICD-10-CM | POA: Diagnosis not present

## 2022-01-03 ENCOUNTER — Other Ambulatory Visit: Payer: Self-pay

## 2022-01-03 ENCOUNTER — Other Ambulatory Visit: Payer: Self-pay | Admitting: Internal Medicine

## 2022-01-03 ENCOUNTER — Other Ambulatory Visit (INDEPENDENT_AMBULATORY_CARE_PROVIDER_SITE_OTHER): Payer: Medicare Other

## 2022-01-03 DIAGNOSIS — E039 Hypothyroidism, unspecified: Secondary | ICD-10-CM | POA: Diagnosis not present

## 2022-01-03 DIAGNOSIS — E559 Vitamin D deficiency, unspecified: Secondary | ICD-10-CM | POA: Diagnosis not present

## 2022-01-03 LAB — T4, FREE: Free T4: 0.76 ng/dL (ref 0.60–1.60)

## 2022-01-03 LAB — T3, FREE: T3, Free: 2.9 pg/mL (ref 2.3–4.2)

## 2022-01-03 LAB — VITAMIN D 25 HYDROXY (VIT D DEFICIENCY, FRACTURES): VITD: 32.71 ng/mL (ref 30.00–100.00)

## 2022-01-03 LAB — TSH: TSH: 2.14 u[IU]/mL (ref 0.35–5.50)

## 2022-01-07 ENCOUNTER — Telehealth: Payer: Self-pay | Admitting: Family Medicine

## 2022-01-07 NOTE — Telephone Encounter (Signed)
Spoke with patient to schedule awv  She stated she wanted to see dr Legrand Como prior to schedule appt

## 2022-01-07 NOTE — Telephone Encounter (Signed)
Left message for patient to call back and schedule Medicare Annual Wellness Visit (AWV) either virtually or in office. Left  my Herbie Drape number [phone]   Last AWV ; 05/30/20 please schedule at anytime with [facility] Nurse Health Advisor 1 or 2

## 2022-02-27 ENCOUNTER — Ambulatory Visit (INDEPENDENT_AMBULATORY_CARE_PROVIDER_SITE_OTHER): Payer: Medicare Other | Admitting: Family Medicine

## 2022-02-27 ENCOUNTER — Encounter: Payer: Self-pay | Admitting: Family Medicine

## 2022-02-27 VITALS — BP 118/70 | HR 67 | Temp 98.1°F | Ht 66.0 in | Wt 217.2 lb

## 2022-02-27 DIAGNOSIS — Z23 Encounter for immunization: Secondary | ICD-10-CM | POA: Diagnosis not present

## 2022-02-27 DIAGNOSIS — E039 Hypothyroidism, unspecified: Secondary | ICD-10-CM | POA: Diagnosis not present

## 2022-02-27 DIAGNOSIS — I10 Essential (primary) hypertension: Secondary | ICD-10-CM

## 2022-02-27 MED ORDER — AMLODIPINE BESYLATE 2.5 MG PO TABS: 2.5000 mg | ORAL_TABLET | Freq: Every day | ORAL | 1 refills | Status: DC

## 2022-02-27 NOTE — Patient Instructions (Addendum)
BZMCEY and Kirke Shaggy are the two injectable medications for weight loss.

## 2022-02-27 NOTE — Assessment & Plan Note (Signed)
Sees Dr. Cruzita Lederer for this, Her TSH is in the normal range on her 60 mg amour thyroid daily.

## 2022-02-27 NOTE — Progress Notes (Unsigned)
Established Patient Office Visit  Subjective   Patient ID: Jacqueline Jennings, female    DOB: 12/09/1950  Age: 71 y.o. MRN: [id number]  Chief Complaint  Patient presents with   Establish Care    Pt is here for transition of care visit. She has no new medical symptoms or problems to discuss.   HTN -- BP in office performed and is well controlled. She  reports no side effects to the medications, no chest pain, SOB, dizziness or headaches. She has a BP cuff at home and is checking BP regularly, reports they are in the normal range.   Hypothroidism-- follows with endocrine and this is well controlled on her 60 mg amour thyroid. No weight gain/loss, no hair loss, no constipation.   Current Outpatient Medications  Medication Instructions   acetaminophen (TYLENOL) 650 mg, Oral, As needed   amLODipine (NORVASC) 2.5 mg, Oral, Daily   ARMOUR THYROID 60 MG tablet TAKE ONE TABLET BY MOUTH DAILY BEFORE BREAKFAST   Cholecalciferol (VITAMIN D-3 PO) 6,000 Units, Oral, Daily   COLLAGEN PO 20 g, Oral, Daily   fexofenadine (ALLEGRA) 180 mg, Oral, Daily   fluocinonide cream (LIDEX) 0.05 % fluocinonide 0.05 % topical cream  APPLY TO THE AFFECTED AREA(S) BY TOPICAL ROUTE 2 TIMES PER DAY   fluticasone (CUTIVATE) 0.05 % cream Topical, 2 times daily   ibuprofen (ADVIL) 200 MG tablet Daily PRN   ketoconazole (NIZORAL) 2 % cream 1 application , Topical, Daily   nystatin ointment (MYCOSTATIN) nystatin 100,000 unit/gram topical ointment  Apply 1 application by topical route.    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     Objective:     BP 118/70 (BP Location: Left Arm, Patient Position: Sitting, Cuff Size: Large)   Pulse 67   Temp 98.1 F (36.7 C) (Oral)   Ht '5\' 6"'$  (1.676 m)   Wt 217 lb 3.2 oz (98.5 kg)   SpO2 96%   BMI 35.06 kg/m    Physical Exam Vitals reviewed.  Constitutional:      Appearance: Normal appearance. She is well-groomed. She is obese.  Eyes:     Conjunctiva/sclera: Conjunctivae normal.  Neck:     Thyroid: No thyromegaly.  Cardiovascular:     Rate and Rhythm: Normal rate and regular rhythm.     Pulses: Normal pulses.     Heart sounds: S1 normal and S2 normal.  Pulmonary:     Effort: Pulmonary effort is normal.     Breath sounds: Normal breath sounds and air entry.  Abdominal:     General: Bowel sounds are normal.  Musculoskeletal:     Right lower leg: No edema.     Left lower leg: No edema.  Neurological:     Mental Status: She is  alert and oriented to person, place, and time. Mental status is at baseline.     Gait: Gait is intact.  Psychiatric:        Mood and Affect: Mood and affect normal.        Speech: Speech normal.        Behavior: Behavior normal.        Judgment: Judgment normal.      No results found for any visits on 02/27/22.    The 10-year ASCVD risk score (Arnett DK, et al., 2019) is: 10.6%    Assessment & Plan:   Problem List Items Addressed This Visit       Cardiovascular and Mediastinum   HTN (hypertension) - Primary    BP well controlled on amlodipine 2.5 mg daily. Pt is not sure if she really needs the medication. She does have a BP cuff at home, I offered that if she wants to try stopping the  medication she should continue to check her BP daily at home. If her BP increases >140/90 then she will need to restart the medication. I sent in refills for her today.      Relevant Medications   amLODipine (NORVASC) 2.5 MG tablet     Endocrine   Hypothyroidism    Sees Dr. Cruzita Lederer for this, Her TSH is in the normal range on her 60 mg amour thyroid daily.      Other Visit Diagnoses     Immunization due       Relevant Orders   Flu Vaccine QUAD High Dose(Fluad) (Completed)       Return in about 6 months (around 08/19/2022) for Annual Physical Exam.    Farrel Conners, MD

## 2022-02-28 NOTE — Assessment & Plan Note (Signed)
BP well controlled on amlodipine 2.5 mg daily. Pt is not sure if she really needs the medication. She does have a BP cuff at home, I offered that if she wants to try stopping the medication she should continue to check her BP daily at home. If her BP increases >140/90 then she will need to restart the medication. I sent in refills for her today.

## 2022-03-06 DIAGNOSIS — R208 Other disturbances of skin sensation: Secondary | ICD-10-CM | POA: Diagnosis not present

## 2022-03-06 DIAGNOSIS — D225 Melanocytic nevi of trunk: Secondary | ICD-10-CM | POA: Diagnosis not present

## 2022-03-06 DIAGNOSIS — B0089 Other herpesviral infection: Secondary | ICD-10-CM | POA: Diagnosis not present

## 2022-03-06 DIAGNOSIS — Z1283 Encounter for screening for malignant neoplasm of skin: Secondary | ICD-10-CM | POA: Diagnosis not present

## 2022-03-06 DIAGNOSIS — L304 Erythema intertrigo: Secondary | ICD-10-CM | POA: Diagnosis not present

## 2022-03-07 ENCOUNTER — Ambulatory Visit (INDEPENDENT_AMBULATORY_CARE_PROVIDER_SITE_OTHER): Payer: Medicare Other

## 2022-03-07 VITALS — Ht 66.0 in | Wt 217.0 lb

## 2022-03-07 DIAGNOSIS — Z0000 Encounter for general adult medical examination without abnormal findings: Secondary | ICD-10-CM | POA: Diagnosis not present

## 2022-03-07 NOTE — Progress Notes (Signed)
Subjective:   Jacqueline Jennings is a 71 y.o. female who presents for Medicare Annual (Subsequent) preventive examination.  Review of Systems    Virtual Visit via Telephone Note  I connected with  Jacqueline Jennings on 03/07/22 at  2:00 PM EST by telephone and verified that I am speaking with the correct person using two identifiers.  Location: Patient: Home Provider: Office Persons participating in the virtual visit: patient/Nurse Health Advisor   I discussed the limitations, risks, security and privacy concerns of performing an evaluation and management service by telephone and the availability of in person appointments. The patient expressed understanding and agreed to proceed.  Interactive audio and video telecommunications were attempted between this nurse and patient, however failed, due to patient having technical difficulties OR patient did not have access to video capability.  We continued and completed visit with audio only.  Some vital signs may be absent or patient reported.   Criselda Peaches, LPN  Cardiac Risk Factors include: advanced age (>45mn, >>27women);hypertension     Objective:    Today's Vitals   03/07/22 1401  Weight: 217 lb (98.4 kg)  Height: '5\' 6"'$  (1.676 m)   Body mass index is 35.02 kg/m.     03/07/2022    2:09 PM 05/30/2020   10:25 AM 04/30/2019    3:33 PM 02/17/2018   10:23 AM 02/03/2015    9:32 AM 09/27/2011   10:32 AM  Advanced Directives  Does Patient Have a Medical Advance Directive? Yes Yes Yes Yes Yes Patient does not have advance directive  Type of [place]ive [place] Power of [place]  [place]  Does patient want to make changes to medical advance directive?   Yes (ED - Information included in AVS)     Copy of [place] Chart? No - copy requested No - copy requested No - copy requested     Pre-existing  out of facility DNR order (yellow form or pink MOST form)      No    Current Medications (verified) Outpatient Encounter Medications as of 03/07/2022  Medication Sig   acetaminophen (TYLENOL) 650 MG CR tablet Take 650 mg by mouth as needed for pain.   amLODipine (NORVASC) 2.5 MG tablet Take 1 tablet (2.5 mg total) by mouth daily.   ARMOUR THYROID 60 MG tablet TAKE ONE TABLET BY MOUTH DAILY BEFORE BREAKFAST   Cholecalciferol (VITAMIN D-3 PO) Take 6,000 Units by mouth daily.   COLLAGEN PO Take 20 g by mouth daily.   fexofenadine (ALLEGRA) 180 MG tablet Take 180 mg by mouth daily.   fluocinonide cream (LIDEX) 0.05 % fluocinonide 0.05 % topical cream  APPLY TO THE AFFECTED AREA(S) BY TOPICAL ROUTE 2 TIMES PER DAY   fluticasone (CUTIVATE) 0.05 % cream Apply topically 2 (two) times daily.   ibuprofen (ADVIL) 200 MG tablet daily as needed.   ketoconazole (NIZORAL) 2 % cream Apply 1 application topically daily.   nystatin ointment (MYCOSTATIN) nystatin 100,000 unit/gram topical ointment  Apply 1 application by topical route.   Facility-Administered Encounter Medications as of 03/07/2022  Medication   0.9 %  sodium chloride infusion    Allergies (verified) Adhesive [tape], Amoxicillin, Augmentin [amoxicillin-pot clavulanate], Latex, and Nabumetone   History: Past Medical History:  Diagnosis Date   Acute medial meniscal tear 10/02/2011   right   Allergy    seasonal   Arthritis 09-27-11  Right knee/Cortisone injection 2'13   Breast cancer ([place]) 2006   hx of with rediation, surgery and adjuvant theapy with tamoxifen  (year two)   Colon polyps 2014   Hyperlipidemia    Hypothyroidism 09-27-11   Low function   IBS (irritable bowel syndrome)    Irritable bowel syndrome 05/19/2007   Qualifier: Diagnosis of  By: Arnoldo Morale MD, Balinda Quails    NEPHROLITHIASIS, HX OF 11/26/2006   Qualifier: Diagnosis of  By: Jimmye Norman, LPN, Winfield Cunas    OSTEOARTHROS UNSPEC WHETHER GEN/LOC [facility] SITE 02/28/2009    Qualifier: Diagnosis of  By: Arnoldo Morale MD, Balinda Quails    Sleep apnea 09-27-11   Cpap used ,started 2'2011   Past Surgical History:  Procedure Laterality Date   BREAST BIOPSY Right 1977   BREAST LUMPECTOMY Right 2006   raditon 13 tx-right   CATARACT EXTRACTION Bilateral 2008   [place] OF UTERUS  2011   done after thickening of uterine lining from tamoxifen   KNEE ARTHROSCOPY  10/02/2011   Procedure: ARTHROSCOPY KNEE;  Surgeon: Gearlean Alf, MD;  Location: [facility];  Service: Orthopedics;  Laterality: Right;  with Debridement, right knee medial meniscus   KNEE ARTHROSCOPY Left 05/2018   left knee arthroscopy  2020   MYOMECTOMY  1990   TONSILLECTOMY     Family History  Problem Relation Age of Onset   Hypertension Mother    Leukemia Mother    Diabetes Father    Uterine cancer Maternal Grandmother    Colon cancer Neg Hx    Social History   Socioeconomic History   Marital status: Widowed    Spouse name: Not on file   Number of children: 0   Years of education: 16   Highest education level: Bachelor's degree (e.g., BA, AB, BS)  Occupational History   Occupation: retired  Tobacco Use   Smoking status: Never   Smokeless tobacco: Never  Vaping Use   Vaping Use: Never used  Substance and Sexual Activity   Alcohol use: Yes    Alcohol/week: 0.0 standard drinks of alcohol    Comment: rare occ   Drug use: No   Sexual activity: Yes  Other Topics Concern   Not on file  Social History Narrative   Work or School: retired Education officer, museum, travels a lot      widowed      Spiritual Beliefs: Christian               Social Determinants of Health   Financial Resource Strain: [place]  (03/07/2022)   Overall Financial Resource Strain (CARDIA)    Difficulty of Paying Living Expenses: Not hard at all  Food Insecurity: No Food Insecurity (03/07/2022)   Hunger Vital Sign    Worried About Running Out of Food in the Last Year: Never true    [address] [place] in the Last Year: Never true  Transportation Needs: No Transportation Needs (03/07/2022)   PRAPARE - Hydrologist (Medical): No    Lack of Transportation (Non-Medical): No  Physical Activity: Sufficiently Active (03/07/2022)   Exercise Vital Sign    Days of Exercise per Week: 3 days    Minutes of Exercise per Session: 60 min  Stress: No Stress Concern Present (03/07/2022)   [place]    Feeling of Stress : Not at all  Social Connections: Moderately Integrated (03/07/2022)   Social Connection  and Isolation Panel [NHANES]    Frequency of Communication with Friends and Family: More than three times a week    Frequency of Social Gatherings with Friends and Family: More than three times a week    Attends Religious Services: More than 4 times per year    Active Member of [employer] or Organizations: Yes    Attends Archivist Meetings: More than 4 times per year    Marital Status: Widowed    Tobacco Counseling Counseling given: Not Answered   Clinical Intake:  Pre-visit preparation completed: No  Pain : No/denies pain     BMI - recorded: 35.02 Nutritional Status: BMI > 30  Obese Nutritional Risks: None Diabetes: No  How often do you need to have someone help you when you read instructions, pamphlets, or other written materials from your doctor or pharmacy?: 1 - Never  Diabetic?  No  Interpreter Needed?: No  Information entered by :: Rolene Arbour LPN   Activities of Daily Living    03/07/2022    2:06 PM  In your present state of health, do you have any difficulty performing the following activities:  Hearing? 0  Vision? 0  Difficulty concentrating or making decisions? 0  Walking or climbing stairs? 0  Dressing or bathing? 0  Doing errands, shopping? 0  Preparing Food and eating ? N  Using the Toilet? N  In the past six months, have you accidently leaked  urine? N  Do you have problems with loss of bowel control? N  Managing your Medications? N  Managing your Finances? N  Housekeeping or managing your Housekeeping? N    Patient Care Team: Farrel Conners, MD as PCP - General (Family Medicine) Philemon Kingdom, MD as Consulting Physician (Internal Medicine) Dian Queen, MD as Consulting Physician (Obstetrics and Gynecology) Levy Sjogren, MD as Referring Physician (Dermatology) Marygrace Drought, MD as Consulting Physician (Ophthalmology) Chesley Mires, MD as Consulting Physician (Pulmonary Disease)  Indicate any recent Medical Services you may have received from other than [facility] providers in the past year (date may be approximate).     Assessment:   This is a routine wellness examination for Jacqueline Jennings.  Hearing/Vision screen Hearing Screening - Comments:: Denies hearing difficulties   Vision Screening - Comments:: Wears rx glasses - up to date with routine eye exams with  Dr Satira Sark  Dietary issues and exercise activities discussed: Exercise limited by: None identified   Goals Addressed               This Visit's Progress     Patient Stated (pt-stated)        Lose weight.        Depression Screen    03/07/2022    2:05 PM 02/27/2022   11:23 AM 08/15/2021    8:53 AM 07/11/2021    9:29 AM 05/30/2020   10:23 AM 04/30/2019    3:35 PM 02/22/2019   10:36 AM  PHQ 2/9 Scores  PHQ - 2 Score 0 0 0 0 0 0 0  PHQ- 9 Score 0 2 2        Fall Risk    03/07/2022    2:07 PM 07/11/2021    9:29 AM 05/30/2020   10:25 AM 04/30/2019    3:34 PM 02/17/2018   10:24 AM  Fall Risk   Falls in the past year? 1 0 0 0 No  Number falls in past yr: 0 0 0    Injury with Fall? 1 0 0  Comment Sprained left Ankle, Followed by medical attention      Risk for fall due to : No Fall Risks No Fall Risks Impaired vision;Impaired balance/gait Medication side effect   Follow up Falls prevention discussed Falls evaluation completed Falls  prevention discussed Falls evaluation completed;Education provided;Falls prevention discussed     FALL RISK PREVENTION PERTAINING TO THE HOME:  Any stairs in or around the home? Yes  If so, are there any without handrails? No  Home free of loose throw rugs in walkways, pet beds, electrical cords, etc? Yes  Adequate lighting in your home to reduce risk of falls? Yes   ASSISTIVE DEVICES UTILIZED TO PREVENT FALLS:  Life alert? No  Use of a cane, walker or w/c? No  Grab bars in the bathroom? Yes  Shower chair or bench in shower? No  Elevated toilet seat or a handicapped toilet? No   TIMED UP AND GO:  Was the test performed? No . Audio Visit  Cognitive Function:        03/07/2022    2:09 PM 04/30/2019    3:35 PM  6CIT Screen  What Year? 0 points 0 points  What month? 0 points 0 points  What time? 0 points 0 points  Count back from 20 0 points 0 points  Months in reverse 0 points 0 points  Repeat phrase 0 points 0 points  Total Score 0 points 0 points    Immunizations Immunization History  Administered Date(s) Administered   Fluad Quad(high Dose 65+) 02/22/2019, 02/23/2020, 03/07/2021, 02/27/2022   Influenza Split 01/14/2011, 02/20/2012   Influenza Whole 01/21/2007, 01/18/2008, 01/17/2009   Influenza, High Dose Seasonal PF 02/14/2017, 02/17/2018   Influenza,inj,Quad PF,6+ Mos 02/08/2013, 02/17/2014, 12/14/2014, 12/19/2015   Influenza,inj,quad, With Preservative 01/28/2018   PFIZER(Purple Top)SARS-COV-2 Vaccination 05/29/2019, 06/23/2019, 02/05/2020   Pneumococcal Conjugate-13 02/14/2017   Pneumococcal Polysaccharide-23 02/17/2018   Td 04/22/2005   Tdap 02/22/2016   Zoster Recombinat (Shingrix) 07/21/2020, 08/20/2020   Zoster, Live 02/20/2012    TDAP status: Up to date  Flu Vaccine status: Up to date  Pneumococcal vaccine status: Up to date  Covid-19 vaccine status: Completed vaccines  Qualifies for Shingles Vaccine? Yes   Zostavax completed Yes   Shingrix  Completed?: Yes  Screening Tests Health Maintenance  Topic Date Due   COVID-19 Vaccine (4 - Pfizer risk series) 03/15/2022 (Originally 04/01/2020)   COLONOSCOPY (Pts 45-50yr Insurance coverage will need to be confirmed)  11/01/2022   MAMMOGRAM  11/21/2022   Medicare Annual Wellness (AWV)  03/08/2023   TETANUS/TDAP  02/21/2026   Pneumonia Vaccine 70 Years old  Completed   INFLUENZA VACCINE  Completed   DEXA SCAN  Completed   Hepatitis C Screening  Completed   Zoster Vaccines- Shingrix  Completed   HPV VACCINES  Aged Out    Health Maintenance  There are no preventive care reminders to display for this patient.   Colorectal cancer screening: Type of screening: Colonoscopy. Completed 10/31/17. Repeat every 5 years  Mammogram status: Completed 11/20/21. Repeat every year  Bone Density status: Completed 12/07/19. Results reflect: Bone density results: OSTEOPOROSIS. Repeat every   years.  Lung Cancer Screening: (Low Dose CT Chest recommended if Age 70-80years, 30 pack-year currently smoking OR have quit w/in 15years.) does not qualify.     Additional Screening:  Hepatitis C Screening: does qualify; Completed 02/20/15  Vision Screening: Recommended annual ophthalmology exams for early detection of glaucoma and other disorders of the eye. Is the patient up to date with  their annual eye exam?  Yes  Who is the provider or what is the name of the office in which the patient attends annual eye exams? Dr Satira Sark If pt is not established with a provider, would they like to be referred to a provider to establish care? No .   Dental Screening: Recommended annual dental exams for proper oral hygiene  Community Resource Referral / Chronic Care Management:  CRR required this visit?  No   CCM required this visit?  No      Plan:     I have personally reviewed and noted the following in the patient's chart:   Medical and social history Use of alcohol, tobacco or illicit drugs   Current medications and supplements including opioid prescriptions. Patient is not currently taking opioid prescriptions. Functional ability and status Nutritional status Physical activity Advanced directives List of other physicians Hospitalizations, surgeries, and ER visits in previous 12 months Vitals Screenings to include cognitive, depression, and falls Referrals and appointments  In addition, I have reviewed and discussed with patient certain preventive protocols, quality metrics, and best practice recommendations. A written personalized care plan for preventive services as well as general preventive health recommendations were provided to patient.     Criselda Peaches, LPN   [id number]/41/[id number]   Nurse Notes: None

## 2022-03-07 NOTE — Patient Instructions (Addendum)
Jacqueline Jennings , Thank you for taking time to come for your Medicare Wellness Visit. I appreciate your ongoing commitment to your health goals. Please review the following plan we discussed and let me know if I can assist you in the future.   These are the goals we discussed:  Goals       Patient Stated (pt-stated)      Lose weight.       Weight (lb) < 200 lb (90.7 kg)      Keep moving forward and living life; Enjoying your friends  The next accomplishment        This is a list of the screening recommended for you and due dates:  Health Maintenance  Topic Date Due   COVID-19 Vaccine (4 - Pfizer risk series) 03/15/2022*   Colon Cancer Screening  11/01/2022   Mammogram  11/21/2022   Medicare Annual Wellness Visit  03/08/2023   Tetanus Vaccine  02/21/2026   Pneumonia Vaccine  Completed   Flu Shot  Completed   DEXA scan (bone density measurement)  Completed   Hepatitis C Screening: USPSTF Recommendation to screen - Ages 36-79 yo.  Completed   Zoster (Shingles) Vaccine  Completed   HPV Vaccine  Aged Out  *Topic was postponed. The date shown is not the original due date.    Advanced directives: Please bring a copy of your health care power of attorney and living will to the office to be added to your chart at your convenience.   Conditions/risks identified: None  Next appointment: Follow up in one year for your annual wellness visit    Preventive Care 65 Years and Older, Female Preventive care refers to lifestyle choices and visits with your health care provider that can promote health and wellness. What does preventive care include? A yearly physical exam. This is also called an annual well check. Dental exams once or twice a year. Routine eye exams. Ask your health care provider how often you should have your eyes checked. Personal lifestyle choices, including: Daily care of your teeth and gums. Regular physical activity. Eating a healthy diet. Avoiding tobacco and drug  use. Limiting alcohol use. Practicing safe sex. Taking low-dose aspirin every day. Taking vitamin and mineral supplements as recommended by your health care provider. What happens during an annual well check? The services and screenings done by your health care provider during your annual well check will depend on your age, overall health, lifestyle risk factors, and family history of disease. Counseling  Your health care provider may ask you questions about your: Alcohol use. Tobacco use. Drug use. Emotional well-being. Home and relationship well-being. Sexual activity. Eating habits. History of falls. Memory and ability to understand (cognition). Work and work Statistician. Reproductive health. Screening  You may have the following tests or measurements: Height, weight, and BMI. Blood pressure. Lipid and cholesterol levels. These may be checked every 5 years, or more frequently if you are over 45 years old. Skin check. Lung cancer screening. You may have this screening every year starting at age 60 if you have a 30-pack-year history of smoking and currently smoke or have quit within the past 15 years. Fecal occult blood test (FOBT) of the stool. You may have this test every year starting at age 108. Flexible sigmoidoscopy or colonoscopy. You may have a sigmoidoscopy every 5 years or a colonoscopy every 10 years starting at age 39. Hepatitis C blood test. Hepatitis B blood test. Sexually transmitted disease (STD) testing. Diabetes screening. This  is done by checking your blood sugar (glucose) after you have not eaten for a while (fasting). You may have this done every 1-3 years. Bone density scan. This is done to screen for osteoporosis. You may have this done starting at age 54. Mammogram. This may be done every 1-2 years. Talk to your health care provider about how often you should have regular mammograms. Talk with your health care provider about your test results, treatment  options, and if necessary, the need for more tests. Vaccines  Your health care provider may recommend certain vaccines, such as: Influenza vaccine. This is recommended every year. Tetanus, diphtheria, and acellular pertussis (Tdap, Td) vaccine. You may need a Td booster every 10 years. Zoster vaccine. You may need this after age 42. Pneumococcal 13-valent conjugate (PCV13) vaccine. One dose is recommended after age 66. Pneumococcal polysaccharide (PPSV23) vaccine. One dose is recommended after age 47. Talk to your health care provider about which screenings and vaccines you need and how often you need them. This information is not intended to replace advice given to you by your health care provider. Make sure you discuss any questions you have with your health care provider. Document Released: 05/05/2015 Document Revised: 12/27/2015 Document Reviewed: 02/07/2015 Elsevier Interactive Patient Education  2017 [place] Prevention in the Home Falls can cause injuries. They can happen to people of all ages. There are many things you can do to make your home safe and to help prevent falls. What can I do on the outside of my home? Regularly fix the edges of walkways and driveways and fix any cracks. Remove anything that might make you trip as you walk through a door, such as a raised step or threshold. Trim any bushes or trees on the path to your home. Use bright outdoor lighting. Clear any walking paths of anything that might make someone trip, such as rocks or tools. Regularly check to see if handrails are loose or broken. Make sure that both sides of any steps have handrails. Any raised decks and porches should have guardrails on the edges. Have any leaves, snow, or ice cleared regularly. Use sand or salt on walking paths during winter. Clean up any spills in your garage right away. This includes oil or grease spills. What can I do in the bathroom? Use night lights. Install grab  bars by the toilet and in the tub and shower. Do not use towel bars as grab bars. Use non-skid mats or decals in the tub or shower. If you need to sit down in the shower, use a plastic, non-slip stool. Keep the floor dry. Clean up any water that spills on the floor as soon as it happens. Remove soap buildup in the tub or shower regularly. Attach bath mats securely with double-sided non-slip rug tape. Do not have throw rugs and other things on the floor that can make you trip. What can I do in the bedroom? Use night lights. Make sure that you have a light by your bed that is easy to reach. Do not use any sheets or blankets that are too big for your bed. They should not hang down onto the floor. Have a firm chair that has side arms. You can use this for support while you get dressed. Do not have throw rugs and other things on the floor that can make you trip. What can I do in the kitchen? Clean up any spills right away. Avoid walking on wet floors. Keep items that  you use a lot in easy-to-reach places. If you need to reach something above you, use a strong step stool that has a grab bar. Keep electrical cords out of the way. Do not use floor polish or wax that makes floors slippery. If you must use wax, use non-skid floor wax. Do not have throw rugs and other things on the floor that can make you trip. What can I do with my stairs? Do not leave any items on the stairs. Make sure that there are handrails on both sides of the stairs and use them. Fix handrails that are broken or loose. Make sure that handrails are as long as the stairways. Check any carpeting to make sure that it is firmly attached to the stairs. Fix any carpet that is loose or worn. Avoid having throw rugs at the top or bottom of the stairs. If you do have throw rugs, attach them to the floor with carpet tape. Make sure that you have a light switch at the top of the stairs and the bottom of the stairs. If you do not have them,  ask someone to add them for you. What else can I do to help prevent falls? Wear shoes that: Do not have high heels. Have rubber bottoms. Are comfortable and fit you well. Are closed at the toe. Do not wear sandals. If you use a stepladder: Make sure that it is fully opened. Do not climb a closed stepladder. Make sure that both sides of the stepladder are locked into place. Ask someone to hold it for you, if possible. Clearly mark and make sure that you can see: Any grab bars or handrails. First and last steps. Where the edge of each step is. Use tools that help you move around (mobility aids) if they are needed. These include: Canes. Walkers. Scooters. Crutches. Turn on the lights when you go into a dark area. Replace any light bulbs as soon as they burn out. Set up your furniture so you have a clear path. Avoid moving your furniture around. If any of your floors are uneven, fix them. If there are any pets around you, be aware of where they are. Review your medicines with your doctor. Some medicines can make you feel dizzy. This can increase your chance of falling. Ask your doctor what other things that you can do to help prevent falls. This information is not intended to replace advice given to you by your health care provider. Make sure you discuss any questions you have with your health care provider. Document Released: 02/02/2009 Document Revised: 09/14/2015 Document Reviewed: 05/13/2014 Elsevier Interactive Patient Education  2017 [employer].

## 2022-03-19 DIAGNOSIS — Z6835 Body mass index (BMI) 35.0-35.9, adult: Secondary | ICD-10-CM | POA: Diagnosis not present

## 2022-03-19 DIAGNOSIS — Z853 Personal history of malignant neoplasm of breast: Secondary | ICD-10-CM | POA: Diagnosis not present

## 2022-03-19 DIAGNOSIS — Z124 Encounter for screening for malignant neoplasm of cervix: Secondary | ICD-10-CM | POA: Diagnosis not present

## 2022-03-22 DIAGNOSIS — M1712 Unilateral primary osteoarthritis, left knee: Secondary | ICD-10-CM | POA: Diagnosis not present

## 2022-03-26 NOTE — Progress Notes (Addendum)
Patient ID: Jacqueline Jennings, female   DOB: [birth year], 71 y.o.   MRN: [id number]  HPI  Jacqueline Jennings is a 71 y.o.-year-old female, returning for follow-up for hypothyroidism and B12 deficiency. Last visit 1 year ago.  Interim history: No falls or fractures since last visit. She slipped in her bathroom but did not fall. No dizziness, disequilibrium, vertigo. She continues to have joint pains - especially knees.  Reviewed history: Pt. has been dx with hypothyroidism in 2007-2008; started on Synthroid >> not feeling better >> was on Armour >> now Naturethyroid 65 mg.  Her TSH was overly suppressed in the past, as shown below. She was initially on a high dose of Armour: 120 mg, which we subsequently decreased to 90 mg, and then further to 60 mg >> currently on Nature-Throid.  Labs normalized and remained controlled afterwards.  She was on Nature-Throid 65 mg >> now Armour 60 mg daily (due to availability): - in am - fasting - at least 30 min from b'fast and coffee with collagen - no calcium - no iron - no multivitamins - no PPIs - not on Biotin  Reviewed her TFTs: Lab Results  Component Value Date   TSH 2.14 01/03/2022   TSH 2.35 08/15/2021   TSH 1.67 03/07/2021   TSH 1.88 06/30/2020   TSH 2.17 11/24/2019   TSH 3.18 02/15/2019   TSH 2.48 02/17/2018   TSH 2.88 03/07/2017   TSH 1.60 03/08/2016   TSH 1.87 03/09/2015   FREET4 0.76 01/03/2022   FREET4 0.64 03/07/2021   FREET4 0.79 06/30/2020   FREET4 0.75 02/15/2019   FREET4 0.79 02/17/2018   FREET4 0.74 03/07/2017   FREET4 0.62 03/08/2016   FREET4 0.67 03/09/2015   FREET4 0.69 09/08/2014   FREET4 0.79 05/24/2014    Pt denies: - feeling nodules in neck - hoarseness - dysphagia - choking  Osteopenia: 12/07/2019 Lumbar spine L1-L4 Femoral neck (FN) 33% distal radius  T-score -0.1 RFN: -1.1 LFN: -1.2 n/a  Change in BMD from previous DXA test (%) +6.4%* -7.0%* n/a  (*) statistically significant   Vitamin D  deficiency:  She was on 2000, then 4000 units vitamin D daily. Now on 6000 units - drops.  Vitamin D levels reviewed: Lab Results  Component Value Date   VD25OH 32.71 01/03/2022   VD25OH 25.25 (L) 08/15/2021   VD25OH 22.93 (L) 08/02/2021   VD25OH 21.43 (L) 05/29/2021   VD25OH 18.76 (L) 03/27/2021   We also diagnosed B12 deficiency in the past.  After starting 5000 mcg of methylcobalamin daily, her B12 was higher than target so the dose was decreased to 5000 mcg every other day.  She is currently on 1000 mcg Me-B12 daily and on this dose her B12 levels have been normal.  Lab Results  Component Value Date   VITAMINB12 512 08/15/2021   VITAMINB12 497 03/27/2021   VITAMINB12 844 06/30/2020   VITAMINB12 459 11/24/2019   VITAMINB12 608 02/15/2019   VITAMINB12 1,282 (H) 02/17/2018   VITAMINB12 982 (H) 02/14/2017   VITAMINB12 759 06/07/2016   VITAMINB12 298 02/22/2016   VITAMINB12 337 03/09/2015   VITAMINB12 280 12/14/2014   VITAMINB12 210 (L) 09/14/2014  On 1000 mcg daily.  She also has a history of BrCa 2006 >> had radiation therapy to right breast.  She lost her husband in 07/2016 due to esophageal cancer.  She was undergoing grief counseling.   ROS: + see HPI  I reviewed pt's medications, allergies, PMH, social hx, family hx, and changes  were documented in the history of present illness. Otherwise, unchanged from my initial visit note.  Past Medical History:  Diagnosis Date   Acute medial meniscal tear 10/02/2011   right   Allergy    seasonal   Arthritis 09-27-11   Right knee/Cortisone injection 2'13   Breast cancer ([place]) 2006   hx of with rediation, surgery and adjuvant theapy with tamoxifen  (year two)   Colon polyps 2014   Hyperlipidemia    Hypothyroidism 09-27-11   Low function   IBS (irritable bowel syndrome)    Irritable bowel syndrome 05/19/2007   Qualifier: Diagnosis of  By: Arnoldo Morale MD, Balinda Quails    NEPHROLITHIASIS, HX OF 11/26/2006   Qualifier: Diagnosis of  By:  Jimmye Norman, LPN, Winfield Cunas    OSTEOARTHROS UNSPEC WHETHER GEN/LOC [facility] SITE 02/28/2009   Qualifier: Diagnosis of  By: Arnoldo Morale MD, Balinda Quails    Sleep apnea 09-27-11   Cpap used ,started 2'7517   Past Surgical History:  Procedure Laterality Date   BREAST BIOPSY Right 1977   BREAST LUMPECTOMY Right 2006   raditon 21 tx-right   CATARACT EXTRACTION Bilateral 2008   [place] OF UTERUS  2011   done after thickening of uterine lining from tamoxifen   KNEE ARTHROSCOPY  10/02/2011   Procedure: ARTHROSCOPY KNEE;  Surgeon: Gearlean Alf, MD;  Location: [facility];  Service: Orthopedics;  Laterality: Right;  with Debridement, right knee medial meniscus   KNEE ARTHROSCOPY Left 05/2018   left knee arthroscopy  2020   MYOMECTOMY  1990   TONSILLECTOMY     Social History   Socioeconomic History   Marital status: Widowed    Spouse name: Not on file   Number of children: 0   Years of education: 16   Highest education level: Bachelor's degree (e.g., BA, AB, BS)  Occupational History   Occupation: retired  Tobacco Use   Smoking status: Never   Smokeless tobacco: Never  Vaping Use   Vaping Use: Never used  Substance and Sexual Activity   Alcohol use: Yes    Alcohol/week: 0.0 standard drinks of alcohol    Comment: rare occ   Drug use: No   Sexual activity: Yes  Other Topics Concern   Not on file  Social History Narrative   Work or School: retired Education officer, museum, travels a lot      widowed      Spiritual Beliefs: Christian               Social Determinants of Health   Financial Resource Strain: [place]  (03/07/2022)   Overall Financial Resource Strain (CARDIA)    Difficulty of Paying Living Expenses: Not hard at all  Food Insecurity: No Food Insecurity (03/07/2022)   Hunger Vital Sign    Worried About Running Out of Food in the Last Year: Never true    [place] in the Last Year: Never true  Transportation Needs: No Transportation Needs  (03/07/2022)   PRAPARE - Hydrologist (Medical): No    Lack of Transportation (Non-Medical): No  Physical Activity: Sufficiently Active (03/07/2022)   Exercise Vital Sign    Days of Exercise per Week: 3 days    Minutes of Exercise per Session: 60 min  Stress: No Stress Concern Present (03/07/2022)   [place]    Feeling of Stress : Not at all  Social Connections: Moderately  Integrated (03/07/2022)   Social Connection and Isolation Panel [NHANES]    Frequency of Communication with Friends and Family: More than three times a week    Frequency of Social Gatherings with Friends and Family: More than three times a week    Attends Religious Services: More than 4 times per year    Active Member of [employer] or Organizations: Yes    Attends Archivist Meetings: More than 4 times per year    Marital Status: Widowed  Intimate Partner Violence: Not At Risk (03/07/2022)   Humiliation, Afraid, Rape, and Kick questionnaire    Fear of Current or Ex-Partner: No    Emotionally Abused: No    Physically Abused: No    Sexually Abused: No   Current Outpatient Medications on File Prior to Visit  Medication Sig Dispense Refill   acetaminophen (TYLENOL) 650 MG CR tablet Take 650 mg by mouth as needed for pain.     amLODipine (NORVASC) 2.5 MG tablet Take 1 tablet (2.5 mg total) by mouth daily. 90 tablet 1   ARMOUR THYROID 60 MG tablet TAKE ONE TABLET BY MOUTH DAILY BEFORE BREAKFAST 90 tablet 3   Cholecalciferol (VITAMIN D-3 PO) Take 6,000 Units by mouth daily.     COLLAGEN PO Take 20 g by mouth daily.     fexofenadine (ALLEGRA) 180 MG tablet Take 180 mg by mouth daily.     fluocinonide cream (LIDEX) 0.05 % fluocinonide 0.05 % topical cream  APPLY TO THE AFFECTED AREA(S) BY TOPICAL ROUTE 2 TIMES PER DAY     fluticasone (CUTIVATE) 0.05 % cream Apply topically 2 (two) times daily.     ibuprofen (ADVIL) 200  MG tablet daily as needed.     ketoconazole (NIZORAL) 2 % cream Apply 1 application topically daily.     nystatin ointment (MYCOSTATIN) nystatin 100,000 unit/gram topical ointment  Apply 1 application by topical route.     Current Facility-Administered Medications on File Prior to Visit  Medication Dose Route Frequency Provider Last Rate Last Admin   0.9 %  sodium chloride infusion  500 mL Intravenous Continuous Ladene Artist, MD       Allergies  Allergen Reactions   Adhesive [Tape]    Amoxicillin    Augmentin [Amoxicillin-Pot Clavulanate] Rash    Drug eruption   Latex Rash    Adhesives products-causes reddness, rash   Nabumetone     REACTION: Rash,Hives   Family History  Problem Relation Age of Onset   Hypertension Mother    Leukemia Mother    Diabetes Father    Uterine cancer Maternal Grandmother    Colon cancer Neg Hx    PE: BP 130/88 (BP Location: Right Arm, Patient Position: Sitting, Cuff Size: Normal)   Pulse 79   Ht _0  (1.676 m)   Wt 217 lb 12.8 oz (98.8 kg)   SpO2 98%   BMI 35.15 kg/m   Wt Readings from Last 3 Encounters:  03/27/22 217 lb 12.8 oz (98.8 kg)  03/07/22 217 lb (98.4 kg)  02/27/22 217 lb 3.2 oz (98.5 kg)   Constitutional: overweight, in NAD, antalgic gait Eyes: EOMI, no exophthalmos ENT: no thyromegaly, no cervical lymphadenopathy Cardiovascular: RRR, No MRG Respiratory: CTA B Musculoskeletal: no deformities, strength intact in all 4 Skin: moist, warm, no rashes Neurological: no tremor with outstretched hands  ASSESSMENT: 1. Hypothyroidism - on desiccated thyroid extract  2. B12 def  3. Osteopenia  4.  Vitamin D deficiency  PLAN:  1. Patient with  longstanding hypothyroidism, desiccated thyroid extract, previously on Nature-Throid, now on Armour. - latest thyroid labs reviewed with pt. >> normal: Lab Results  Component Value Date   TSH 2.14 01/03/2022  - she continues on Armour 60 mg daily - pt feels good on this dose. - we  discussed about taking the thyroid hormone every day, with water, >30 minutes before breakfast, separated by >4 hours from acid reflux medications, calcium, iron, multivitamins. Pt. is taking it correctly. -I refilled her Armour dose.  2. B12 def -Previously on 5000 mcg B12 daily, then decrease to 5000 mcg every other day but currently on 1000 mcg daily -She denies fatigue, numbness and tingling, anemia -B12 normalized afterwards-512 at last check in 07/2021 -She will have a new vitamin B12 level repeated at next visit with PCP in 07/2022  3. Osteopenia and 4.  Vitamin D deficiency -No falls or fractures since last visit -Reviewed latest bone density scan report from 2021: Her femoral neck BMD is in the mild osteopenic range. -At today's visit, she is due for another bone density scan-we will order it today -At that time, we discussed that no treatment was needed, however, we had to make sure that the serum calcium level stays normal.  I recommended to get ~1200 mg of calcium daily from the diet but she could supplement if she did not think that she got this much in the regular day.  We also discussed that we need to keep her normal vitamin D (she is on supplementation).  At last visit, she had joint pains and Achilles tendinitis and could not exercise but I advised her to at least do chair exercises.   -At last visit her vitamin D was very low, at 18.76.  I advised her to increase the dose to 4000 units daily.  Vitamin D level improved but did not normalize in 07/2021.  She is taking 6000 units now - drops.  On this formulation, her vitamin D level finally normalized in 12/2021.  She mentions that she is missing some doses -Subsequent vitamin D levels were improved, and the last level was normal: Lab Results  Component Value Date   VD25OH 32.71 01/03/2022   VD25OH 25.25 (L) 08/15/2021  -We will have a vitamin D level repeated at next visit with PCP in 07/2022.  03/29/2022 (May) Lumbar spine  L1-L4(L3) Femoral neck (FN)  T-score -0.5 RFN: -1.3 LFN: -1.7  Change in BMD from previous DXA test (%) Down 3.7%* Down 6.2%*  (*) statistically significant    FRAX 10-year fracture risk calculator: 15.7 % for any major fracture and 2.5 % for hip fracture. Pharmacologic therapy is recommended if 10 year fracture risk is >20% for any major osteoporotic fracture or >3% for hip fracture.   L3 vertebra had to be excluded from analysis due to DJD  Bone density continues to decline.  I would suggest a low-dose Fosamax, 35 mg weekly.  Will discuss with patient.  Philemon Kingdom, MD PhD [facility]

## 2022-03-27 ENCOUNTER — Encounter: Payer: Self-pay | Admitting: Internal Medicine

## 2022-03-27 ENCOUNTER — Ambulatory Visit (INDEPENDENT_AMBULATORY_CARE_PROVIDER_SITE_OTHER): Payer: Medicare Other | Admitting: Internal Medicine

## 2022-03-27 VITALS — BP 130/88 | HR 79 | Ht 66.0 in | Wt 217.8 lb

## 2022-03-27 DIAGNOSIS — E559 Vitamin D deficiency, unspecified: Secondary | ICD-10-CM | POA: Diagnosis not present

## 2022-03-27 DIAGNOSIS — M949 Disorder of cartilage, unspecified: Secondary | ICD-10-CM

## 2022-03-27 DIAGNOSIS — E039 Hypothyroidism, unspecified: Secondary | ICD-10-CM | POA: Diagnosis not present

## 2022-03-27 DIAGNOSIS — M85851 Other specified disorders of bone density and structure, right thigh: Secondary | ICD-10-CM

## 2022-03-27 DIAGNOSIS — E538 Deficiency of other specified B group vitamins: Secondary | ICD-10-CM | POA: Diagnosis not present

## 2022-03-27 DIAGNOSIS — M85852 Other specified disorders of bone density and structure, left thigh: Secondary | ICD-10-CM | POA: Diagnosis not present

## 2022-03-27 DIAGNOSIS — M899 Disorder of bone, unspecified: Secondary | ICD-10-CM

## 2022-03-27 MED ORDER — ARMOUR THYROID 60 MG PO TABS: ORAL_TABLET | ORAL | 3 refills | Status: DC

## 2022-03-27 NOTE — Patient Instructions (Signed)
Please continue Armour 60 mg daily.  Take the thyroid hormones every day, with water, at least 30 minutes before breakfast, separated by at least 4 hours from: - acid reflux medications - calcium - iron - multivitamins  Please call and schedule bone density scan at the [facility] Office: [phone].   Please come back for a follow-up appointment in 1 year.

## 2022-03-29 ENCOUNTER — Ambulatory Visit (INDEPENDENT_AMBULATORY_CARE_PROVIDER_SITE_OTHER)
Admission: RE | Admit: 2022-03-29 | Discharge: 2022-03-29 | Disposition: A | Discharge status: Home / Self Care | Payer: Medicare Other | Source: Ambulatory Visit | Attending: Internal Medicine | Admitting: Internal Medicine

## 2022-03-29 DIAGNOSIS — M85852 Other specified disorders of bone density and structure, left thigh: Secondary | ICD-10-CM

## 2022-03-29 DIAGNOSIS — M899 Disorder of bone, unspecified: Secondary | ICD-10-CM | POA: Diagnosis not present

## 2022-03-29 DIAGNOSIS — M85851 Other specified disorders of bone density and structure, right thigh: Secondary | ICD-10-CM | POA: Diagnosis not present

## 2022-03-29 DIAGNOSIS — M949 Disorder of cartilage, unspecified: Secondary | ICD-10-CM | POA: Diagnosis not present

## 2022-04-02 ENCOUNTER — Encounter: Payer: Self-pay | Admitting: Internal Medicine

## 2022-04-04 DIAGNOSIS — M25572 Pain in left ankle and joints of left foot: Secondary | ICD-10-CM | POA: Diagnosis not present

## 2022-04-10 DIAGNOSIS — H04123 Dry eye syndrome of bilateral lacrimal glands: Secondary | ICD-10-CM | POA: Diagnosis not present

## 2022-04-10 DIAGNOSIS — H0100A Unspecified blepharitis right eye, upper and lower eyelids: Secondary | ICD-10-CM | POA: Diagnosis not present

## 2022-05-14 ENCOUNTER — Ambulatory Visit (HOSPITAL_BASED_OUTPATIENT_CLINIC_OR_DEPARTMENT_OTHER): Payer: Medicare Other | Admitting: Pulmonary Disease

## 2022-05-14 ENCOUNTER — Encounter (HOSPITAL_BASED_OUTPATIENT_CLINIC_OR_DEPARTMENT_OTHER): Payer: Self-pay

## 2022-05-20 ENCOUNTER — Telehealth: Payer: Self-pay | Admitting: Pulmonary Disease

## 2022-05-20 NOTE — Telephone Encounter (Signed)
Called patient and advised her that she needed to bring in SD card from CPAP. She states she does not have one in her machine. On Jacqueline Jennings it was last updated on 01/17/2022.   Called and left message with [facility] to determine issues with machine or if she needed an SD card. Will keep encounter open till we hear back from [place].

## 2022-05-21 ENCOUNTER — Ambulatory Visit (INDEPENDENT_AMBULATORY_CARE_PROVIDER_SITE_OTHER): Payer: Medicare Other | Admitting: Primary Care

## 2022-05-21 VITALS — BP 140/80 | HR 81 | Temp 98.0°F | Ht 66.0 in | Wt 221.6 lb

## 2022-05-21 DIAGNOSIS — G4733 Obstructive sleep apnea (adult) (pediatric): Secondary | ICD-10-CM | POA: Diagnosis not present

## 2022-05-21 NOTE — Patient Instructions (Addendum)
Sleep apnea is well-controlled on current CPAP pressure No changes today Continue to wear CPAP every night 4-6 hours or longer   Orders: Renew CPAP supplies  Follow-up: 1 year with Dr. Halford Chessman or [facility] NP or sooner if needed

## 2022-05-21 NOTE — Progress Notes (Signed)
$'@Patient'Y$  ID: Jacqueline Jennings, female    DOB: [birth year], 72 y.o.   MRN: [id number]  Chief Complaint  Patient presents with   Follow-up    Cpap f/up    Referring provider: Farrel Conners, MD  HPI: 72 year old female, never smoker. PMH significant for HTN, OSA, hypothyroidism, arthritis, hyperlipidemia, hx breast cancer, vit D deficiency. Patient of Dr. Halford Jennings.   Previous [facility] pulmonary encounter: 11/130/21- Dr. Halford Jennings She uses CPAP nightly.  Has nasal pillow mask.  Not having sinus congestion, sore throat, dry mouth.  Has oral appliance that she uses intermittently when traveling.  Her CPAP is more than 71 yrs old.  Starting to make funny noises.  05/21/2022 - Interim hx  Patient presents today for overdue follow-up OSA/CPAP. She received new CPAP 2 years ago  She is wearing CPAP nightly No issues with mask fit or pressure settings  She uses nasal pillow  CPAP is on auto settings  Sleeping well at night. No snoring.  No significant daytime sleepiness  She uses oral appliance for long travel   [employer] download 04/21/2022-05/20/2022 Usage days 30/30 days (100%) greater than 4 hours Average usage 8 hours 28 minutes Pressure 5 to 20 cm H2O (14.5 m H2O-95%) Air leaks 22.1 L/min (95%) AHI 0.1   Sleep testing: PSG 02/01/09 >> AHI 11 Auto CPAP 02/20/20 to 03/20/20 >> used on 30 of 30 nights with average 8 hrs 35 min.  Average AHI 0.2 with median CPAP 9 and 95 th percentile CPAP 12 cm H2O   Allergies  Allergen Reactions   Adhesive [Tape]    Amoxicillin    Augmentin [Amoxicillin-Pot Clavulanate] Rash    Drug eruption   Latex Rash    Adhesives products-causes reddness, rash   Nabumetone     REACTION: Rash,Hives    Immunization History  Administered Date(s) Administered   Fluad Quad(high Dose 65+) 02/22/2019, 02/23/2020, 03/07/2021, 02/27/2022   Influenza Split 01/14/2011, 02/20/2012   Influenza Whole 01/21/2007, 01/18/2008, 01/17/2009   Influenza, High Dose Seasonal PF  02/14/2017, 02/17/2018   Influenza,inj,Quad PF,6+ Mos 02/08/2013, 02/17/2014, 12/14/2014, 12/19/2015   Influenza,inj,quad, With Preservative 01/28/2018   Influenza-Unspecified 02/27/2022   PFIZER(Purple Top)SARS-COV-2 Vaccination 05/29/2019, 06/23/2019, 02/05/2020   Pneumococcal Conjugate-13 02/14/2017   Pneumococcal Polysaccharide-23 02/17/2018   Td 04/22/2005   Tdap 02/22/2016   Unspecified SARS-COV-2 Vaccination 05/29/2019, 06/23/2019, 02/05/2020   Zoster Recombinat (Shingrix) 07/21/2020, 08/20/2020   Zoster, Live 02/20/2012, 07/20/2020, 09/19/2020    Past Medical History:  Diagnosis Date   Acute medial meniscal tear 10/02/2011   right   Allergy    seasonal   Arthritis 09-27-11   Right knee/Cortisone injection 2'13   Breast cancer ([place]) 2006   hx of with rediation, surgery and adjuvant theapy with tamoxifen  (year two)   Colon polyps 2014   Hyperlipidemia    Hypothyroidism 09-27-11   Low function   IBS (irritable bowel syndrome)    Irritable bowel syndrome 05/19/2007   Qualifier: Diagnosis of  By: Arnoldo Morale MD, Balinda Quails    NEPHROLITHIASIS, HX OF 11/26/2006   Qualifier: Diagnosis of  By: Jimmye Norman, LPN, Winfield Cunas    OSTEOARTHROS UNSPEC WHETHER GEN/LOC [facility] SITE 02/28/2009   Qualifier: Diagnosis of  By: Arnoldo Morale MD, Balinda Quails    Sleep apnea 09-27-11   Cpap used ,started 3'7628    Tobacco History: Social History   Tobacco Use  Smoking Status Never  Smokeless Tobacco Never   Counseling given: Not Answered   Outpatient Medications Prior to Visit  Medication Sig Dispense Refill   fexofenadine (ALLEGRA) 180 MG tablet Take 180 mg by mouth daily.     acetaminophen (TYLENOL) 650 MG CR tablet Take 650 mg by mouth as needed for pain.     amLODipine (NORVASC) 2.5 MG tablet Take 1 tablet (2.5 mg total) by mouth daily. 90 tablet 1   ARMOUR THYROID 60 MG tablet TAKE ONE TABLET BY MOUTH DAILY BEFORE BREAKFAST 90 tablet 3   Cholecalciferol (VITAMIN D-3 PO) Take 6,000 Units by mouth daily.      COLLAGEN PO Take 20 g by mouth daily.     fluocinonide cream (LIDEX) 0.05 % fluocinonide 0.05 % topical cream  APPLY TO THE AFFECTED AREA(S) BY TOPICAL ROUTE 2 TIMES PER DAY     fluticasone (CUTIVATE) 0.05 % cream Apply topically 2 (two) times daily.     ibuprofen (ADVIL) 200 MG tablet daily as needed.     ketoconazole (NIZORAL) 2 % cream Apply 1 application topically daily.     nystatin ointment (MYCOSTATIN) nystatin 100,000 unit/gram topical ointment  Apply 1 application by topical route.     Facility-Administered Medications Prior to Visit  Medication Dose Route Frequency Provider Last Rate Last Admin   0.9 %  sodium chloride infusion  500 mL Intravenous Continuous Ladene Artist, MD       Review of Systems  Review of Systems  Constitutional: Negative.   Respiratory: Negative.    Cardiovascular: Negative.    Physical Exam  BP (!) 140/80 (BP Location: Left Arm)   Pulse 81   Temp 98 F (36.7 C)   Ht '5\' 6"'$  (1.676 m)   Wt 221 lb 9.6 oz (100.5 kg)   SpO2 96%   BMI 35.77 kg/m  Physical Exam Constitutional:      Appearance: Normal appearance.  HENT:     Head: Normocephalic and atraumatic.     Mouth/Throat:     Mouth: Mucous membranes are moist.     Pharynx: Oropharynx is clear.  Cardiovascular:     Rate and Rhythm: Normal rate and regular rhythm.  Pulmonary:     Effort: Pulmonary effort is normal.     Breath sounds: Normal breath sounds.  Neurological:     General: No focal deficit present.     Mental Status: She is alert and oriented to person, place, and time. Mental status is at baseline.  Psychiatric:        Mood and Affect: Mood normal.        Behavior: Behavior normal.        Thought Content: Thought content normal.        Judgment: Judgment normal.      Lab Results:  CBC    Component Value Date/Time   WBC 5.9 08/15/2021 0855   RBC 4.78 08/15/2021 0855   HGB 13.9 08/15/2021 0855   HGB 12.7 08/09/2010 1343   HCT 41.8 08/15/2021 0855   HCT 39.3  08/09/2010 1343   PLT 332.0 08/15/2021 0855   PLT 284 08/09/2010 1343   MCV 87.5 08/15/2021 0855   MCV 86.2 08/09/2010 1343   MCH 28.7 11/24/2019 0929   MCHC 33.3 08/15/2021 0855   RDW 14.3 08/15/2021 0855   RDW 14.1 08/09/2010 1343   LYMPHSABS 1.5 08/15/2021 0855   LYMPHSABS 2.1 08/09/2010 1343   MONOABS 0.5 08/15/2021 0855   MONOABS 0.4 08/09/2010 1343   EOSABS 0.1 08/15/2021 0855   EOSABS 0.2 08/09/2010 1343   BASOSABS 0.0 08/15/2021 0855   BASOSABS 0.0 08/09/2010  1343    BMET    Component Value Date/Time   NA 140 08/15/2021 0855   K 4.2 08/15/2021 0855   CL 103 08/15/2021 0855   CO2 30 08/15/2021 0855   GLUCOSE 93 08/15/2021 0855   BUN 16 08/15/2021 0855   CREATININE 0.70 08/15/2021 0855   CREATININE 0.67 11/24/2019 0929   CALCIUM 9.6 08/15/2021 0855   CALCIUM 10.3 01/14/2011 1236   GFRNONAA 90 11/24/2019 0929   GFRAA 105 11/24/2019 0929    BNP No results found for: "BNP"  ProBNP No results found for: "PROBNP"  Imaging: No results found.   Assessment & Plan:   Obstructive sleep apnea - Patient is 100% compliant with CPAP. Reports benefit from use. Pressure 5-20cm h20; Residual AHI 0.1/hour. No changes today. Renew CPAP supplies with [facility]. Encourage patient continue to wear CPAP nightly 4-6 hours or longer. FU in 1 year or sooner.    Martyn Ehrich, NP 05/21/2022

## 2022-05-21 NOTE — Assessment & Plan Note (Signed)
-  Patient is 100% compliant with CPAP. Reports benefit from use. Pressure 5-20cm h20; Residual AHI 0.1/hour. No changes today. Renew CPAP supplies with [facility]. Encourage patient continue to wear CPAP nightly 4-6 hours or longer. FU in 1 year or sooner.

## 2022-05-24 NOTE — Telephone Encounter (Signed)
Pt had OV with Beth 1/30 and all was taken care of during that visit.

## 2022-06-05 DIAGNOSIS — H04121 Dry eye syndrome of right lacrimal gland: Secondary | ICD-10-CM | POA: Diagnosis not present

## 2022-06-05 DIAGNOSIS — H0100A Unspecified blepharitis right eye, upper and lower eyelids: Secondary | ICD-10-CM | POA: Diagnosis not present

## 2022-06-05 DIAGNOSIS — H0100B Unspecified blepharitis left eye, upper and lower eyelids: Secondary | ICD-10-CM | POA: Diagnosis not present

## 2022-06-05 DIAGNOSIS — H04122 Dry eye syndrome of left lacrimal gland: Secondary | ICD-10-CM | POA: Diagnosis not present

## 2022-06-19 DIAGNOSIS — H0100B Unspecified blepharitis left eye, upper and lower eyelids: Secondary | ICD-10-CM | POA: Diagnosis not present

## 2022-06-19 DIAGNOSIS — H0100A Unspecified blepharitis right eye, upper and lower eyelids: Secondary | ICD-10-CM | POA: Diagnosis not present

## 2022-06-19 DIAGNOSIS — H04123 Dry eye syndrome of bilateral lacrimal glands: Secondary | ICD-10-CM | POA: Diagnosis not present

## 2022-07-31 DIAGNOSIS — H04121 Dry eye syndrome of right lacrimal gland: Secondary | ICD-10-CM | POA: Diagnosis not present

## 2022-07-31 DIAGNOSIS — H04122 Dry eye syndrome of left lacrimal gland: Secondary | ICD-10-CM | POA: Diagnosis not present

## 2022-08-20 ENCOUNTER — Encounter: Payer: Medicare Other | Admitting: Family Medicine

## 2022-09-10 ENCOUNTER — Ambulatory Visit (INDEPENDENT_AMBULATORY_CARE_PROVIDER_SITE_OTHER): Payer: Medicare Other | Admitting: Family Medicine

## 2022-09-10 ENCOUNTER — Encounter: Payer: Self-pay | Admitting: Family Medicine

## 2022-09-10 VITALS — BP 122/82 | HR 75 | Temp 98.5°F | Ht 66.0 in | Wt 214.2 lb

## 2022-09-10 DIAGNOSIS — R7301 Impaired fasting glucose: Secondary | ICD-10-CM

## 2022-09-10 DIAGNOSIS — I10 Essential (primary) hypertension: Secondary | ICD-10-CM

## 2022-09-10 DIAGNOSIS — M1990 Unspecified osteoarthritis, unspecified site: Secondary | ICD-10-CM

## 2022-09-10 DIAGNOSIS — E039 Hypothyroidism, unspecified: Secondary | ICD-10-CM | POA: Diagnosis not present

## 2022-09-10 DIAGNOSIS — E785 Hyperlipidemia, unspecified: Secondary | ICD-10-CM

## 2022-09-10 DIAGNOSIS — E559 Vitamin D deficiency, unspecified: Secondary | ICD-10-CM

## 2022-09-10 DIAGNOSIS — R202 Paresthesia of skin: Secondary | ICD-10-CM | POA: Diagnosis not present

## 2022-09-10 MED ORDER — TELMISARTAN 20 MG PO TABS
20.0000 mg | ORAL_TABLET | Freq: Every day | ORAL | 1 refills | Status: DC
Start: 2022-09-10 — End: 2023-03-06

## 2022-09-10 MED ORDER — MELOXICAM 15 MG PO TABS: 15.0000 mg | ORAL_TABLET | Freq: Every day | ORAL | 1 refills | Status: DC

## 2022-09-10 NOTE — Assessment & Plan Note (Signed)
Having side effects to the amlodipine, will stop this medication and start telmisartan, starting tiwht 10 mg daily and increasing to 20 mg daily if her BP is not controlled at home. I reviewed her home readings with her and she is getting numbers in the 130's consistently at home. I advised she continue to monitor her pressures at home.

## 2022-09-10 NOTE — Patient Instructions (Addendum)
1200 mg daily of calcium   STOP amlodipine  START telmisartan at 1/2 tablet (10 mg ) daily.Marland Kitchen if your blood pressure is not controlled then increase to 1 tablet daily (20 mg)

## 2022-09-10 NOTE — Assessment & Plan Note (Signed)
We discussed the risks/benefits of taking chronic NSAIDS, I think that the increase in BP would be negligible if it allows her to increase physical activity. Will continue to montior renal function and cardiac status while taking the NSAIDS>

## 2022-09-10 NOTE — Progress Notes (Signed)
Established Patient Office Visit  Subjective   Patient ID: Jacqueline Jennings, female    DOB: 11-23-1950  Age: 72 y.o. MRN: [id number]  Chief Complaint  Patient presents with   Annual Exam    Pt is here for follow up today.   Osteopenia-- we reviewed her bone density study, she reports she is not currently taking a calcium supplement. Is still taking her vitamin D supplements  HTN-- patient states that the amlodipine is causing "throbbing" in her arms and legs. States that she read about the amlodipine and said that it could be causing the issue. States that she went off of the amlodipine in march and she reported that the pain stopped when she was off the medication. States when she restarted the amlodipine the symptom returned. States that when she stopped the amlodipine her blood pressure did increase while she was of the BP medication.   Pt states that she started meloxicam for her arthritis pain. States that it is working very well to control her joint pain, thinks that she might start exercising again if she is able to take this medication regularly. We discussed the risks/beneifts of taking chronic NSAIDS and patient's questions were answered.     Current Outpatient Medications  Medication Instructions   acetaminophen (TYLENOL) 650 mg, Oral, As needed   ARMOUR THYROID 60 MG tablet TAKE ONE TABLET BY MOUTH DAILY BEFORE BREAKFAST   Cholecalciferol (VITAMIN D-3 PO) 6,000 Units, Oral, Daily   COLLAGEN PO 20 g, Oral, Daily   fexofenadine (ALLEGRA) 180 mg, Oral, Daily   fluocinonide cream (LIDEX) 0.05 % fluocinonide 0.05 % topical cream  APPLY TO THE AFFECTED AREA(S) BY TOPICAL ROUTE 2 TIMES PER DAY   fluticasone (CUTIVATE) 0.05 % cream Topical, 2 times daily   ketoconazole (NIZORAL) 2 % cream 1 application , Topical, Daily   meloxicam (MOBIC) 15 mg, Oral, Daily   nystatin ointment (MYCOSTATIN) nystatin 100,000 unit/gram topical ointment  Apply 1 application by topical route.    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     Objective:     BP 122/82 (BP Location: Left Arm, Patient Position: Sitting, Cuff Size: Large)   Pulse 75   Temp 98.5 F (36.9 C) (Oral)   Ht 5\' 6"  (1.676 m)   Wt 214 lb 3.2 oz (97.2 kg)   SpO2 97%   BMI 34.57 kg/m    Physical Exam Vitals reviewed.  Constitutional:      Appearance: Normal appearance. She is well-groomed. She is obese.  Eyes:     Conjunctiva/sclera: Conjunctivae normal.  Neck:     Thyroid: No thyromegaly.  Cardiovascular:     Rate and Rhythm: Normal rate and regular rhythm.  Pulses: Normal pulses.     Heart sounds: S1 normal and S2 normal.  Pulmonary:     Effort: Pulmonary effort is normal.     Breath sounds: Normal breath sounds and air entry.  Abdominal:     General: Bowel sounds are normal.  Musculoskeletal:     Right lower leg: No edema.     Left lower leg: No edema.  Neurological:     Mental Status: She is alert and oriented to person, place, and time. Mental status is at baseline.     Gait: Gait is intact.  Psychiatric:        Mood and Affect: Mood and affect normal.        Speech: Speech normal.        Behavior: Behavior normal.        Judgment:  Judgment normal.      No results found for any visits on 09/10/22.    The 10-year ASCVD risk score (Arnett DK, et al., 2019) is: 11.3%    Assessment & Plan:  Vitamin D deficiency -     history of, I recommended 1200 mg calcium supplements daily to add to the vitamin D. Checking new levels today VITAMIN D 25 Hydroxy (Vit-D Deficiency, Fractures); Future  Acquired hypothyroidism -     Followed by Dr. Elvera Lennox, will check TSH  TSH; Future  Hyperlipidemia, unspecified hyperlipidemia type -    Needs new cholesterol panel today to calculate cardiac risk   Lipid panel; Future  Primary hypertension Assessment & Plan: Having side effects to the amlodipine, will stop this medication and start telmisartan, starting tiwht 10 mg daily and increasing to 20 mg daily if her BP is not controlled at home. I reviewed her home readings with her and she is getting numbers in the 130's consistently at home. I advised she continue to monitor her pressures at home.   Orders: -     Comprehensive metabolic panel; Future -     Telmisartan; Take 1 tablet (20 mg total) by mouth daily.  Dispense: 90 tablet; Refill: 1  Bilateral leg paresthesia -     Pt reporting this symptom today, will check B12  Vitamin B12; Future  Arthritis Assessment & Plan: We discussed the risks/benefits of taking chronic NSAIDS, I think that the increase in BP would be negligible if it allows her to increase physical activity. Will continue to montior renal function and cardiac status while taking the NSAIDS>   Orders: -     Meloxicam; Take 1 tablet (15 mg total) by mouth daily.  Dispense: 90 tablet; Refill: 1     Return in about 6 months (around 03/13/2023) for HTN.    Karie Georges, MD

## 2022-09-11 ENCOUNTER — Other Ambulatory Visit (INDEPENDENT_AMBULATORY_CARE_PROVIDER_SITE_OTHER): Payer: Medicare Other

## 2022-09-11 DIAGNOSIS — E559 Vitamin D deficiency, unspecified: Secondary | ICD-10-CM | POA: Diagnosis not present

## 2022-09-11 DIAGNOSIS — E039 Hypothyroidism, unspecified: Secondary | ICD-10-CM

## 2022-09-11 DIAGNOSIS — R202 Paresthesia of skin: Secondary | ICD-10-CM

## 2022-09-11 DIAGNOSIS — I10 Essential (primary) hypertension: Secondary | ICD-10-CM | POA: Diagnosis not present

## 2022-09-11 DIAGNOSIS — E785 Hyperlipidemia, unspecified: Secondary | ICD-10-CM | POA: Diagnosis not present

## 2022-09-11 LAB — COMPREHENSIVE METABOLIC PANEL
ALT: 16 U/L (ref 0–35)
AST: 18 U/L (ref 0–37)
Albumin: 4.1 g/dL (ref 3.5–5.2)
Alkaline Phosphatase: 62 U/L (ref 39–117)
BUN: 18 mg/dL (ref 6–23)
CO2: 28 mEq/L (ref 19–32)
Calcium: 9.6 mg/dL (ref 8.4–10.5)
Chloride: 104 mEq/L (ref 96–112)
Creatinine, Ser: 0.73 mg/dL (ref 0.40–1.20)
GFR: 83.06 mL/min (ref >60.00)
Glucose, Bld: 106 mg/dL — ABNORMAL HIGH (ref 70–99)
Potassium: 4.2 mEq/L (ref 3.5–5.1)
Sodium: 140 mEq/L (ref 135–145)
Total Bilirubin: 0.5 mg/dL (ref 0.2–1.2)
Total Protein: 6.7 g/dL (ref 6.0–8.3)

## 2022-09-11 LAB — LIPID PANEL
Cholesterol: 197 mg/dL (ref 0–200)
HDL: 54 mg/dL (ref >39.00)
LDL Cholesterol: 121 mg/dL — ABNORMAL HIGH (ref 0–99)
NonHDL: 143.47
Total CHOL/HDL Ratio: 4
Triglycerides: 114 mg/dL (ref 0.0–149.0)
VLDL: 22.8 mg/dL (ref 0.0–40.0)

## 2022-09-11 LAB — VITAMIN B12: Vitamin B-12: 275 pg/mL (ref 211–911)

## 2022-09-11 LAB — VITAMIN D 25 HYDROXY (VIT D DEFICIENCY, FRACTURES): VITD: 36.3 ng/mL (ref 30.00–100.00)

## 2022-09-11 LAB — TSH: TSH: 2.89 u[IU]/mL (ref 0.35–5.50)

## 2022-09-13 ENCOUNTER — Encounter: Payer: Self-pay | Admitting: Family Medicine

## 2022-09-15 ENCOUNTER — Ambulatory Visit
Admission: RE | Admit: 2022-09-15 | Discharge: 2022-09-15 | Disposition: A | Discharge status: Home / Self Care | Payer: Medicare Other | Source: Ambulatory Visit | Attending: Urgent Care | Admitting: Urgent Care

## 2022-09-15 ENCOUNTER — Ambulatory Visit (INDEPENDENT_AMBULATORY_CARE_PROVIDER_SITE_OTHER): Payer: Medicare Other

## 2022-09-15 VITALS — BP 148/89 | HR 83 | Temp 98.8°F | Resp 20

## 2022-09-15 DIAGNOSIS — R051 Acute cough: Secondary | ICD-10-CM | POA: Diagnosis not present

## 2022-09-15 DIAGNOSIS — J209 Acute bronchitis, unspecified: Secondary | ICD-10-CM | POA: Diagnosis not present

## 2022-09-15 DIAGNOSIS — R059 Cough, unspecified: Secondary | ICD-10-CM | POA: Diagnosis not present

## 2022-09-15 DIAGNOSIS — R062 Wheezing: Secondary | ICD-10-CM | POA: Diagnosis not present

## 2022-09-15 DIAGNOSIS — R0600 Dyspnea, unspecified: Secondary | ICD-10-CM | POA: Diagnosis not present

## 2022-09-15 MED ORDER — PREDNISONE 20 MG PO TABS: ORAL_TABLET | ORAL | 0 refills | Status: DC

## 2022-09-15 MED ORDER — PROMETHAZINE-DM 6.25-15 MG/5ML PO SYRP: 5.0000 mL | ORAL_SOLUTION | Freq: Three times a day (TID) | ORAL | 0 refills | Status: DC | PRN

## 2022-09-15 NOTE — ED Provider Notes (Signed)
[facility]  Note:  This document was prepared using Conservation officer, historic buildings and may include unintentional dictation errors.  MRN: [id number] DOB: 1950/06/16  Subjective:   Jacqueline Jennings is a 72 y.o. female presenting for 4 day history of acute onset dry hacking cough, wheezing, shob, dyspnea.  Has concerns for bronchitis or pneumonia.  No history of asthma.  No smoking.  No history of clotting disorders, congestive heart failure.   Current Facility-Administered Medications:    0.9 %  sodium chloride infusion, 500 mL, Intravenous, Continuous, Meryl Dare, MD  Current Outpatient Medications:    acetaminophen (TYLENOL) 650 MG CR tablet, Take 650 mg by mouth as needed for pain., Disp: , Rfl:    ARMOUR THYROID 60 MG tablet, TAKE ONE TABLET BY MOUTH DAILY BEFORE BREAKFAST, Disp: 90 tablet, Rfl: 3   Cholecalciferol (VITAMIN D-3 PO), Take 6,000 Units by mouth daily., Disp: , Rfl:    COLLAGEN PO, Take 20 g by mouth daily., Disp: , Rfl:    fexofenadine (ALLEGRA) 180 MG tablet, Take 180 mg by mouth daily., Disp: , Rfl:    fluocinonide cream (LIDEX) 0.05 %, fluocinonide 0.05 % topical cream  APPLY TO THE AFFECTED AREA(S) BY TOPICAL ROUTE 2 TIMES PER DAY, Disp: , Rfl:    fluticasone (CUTIVATE) 0.05 % cream, Apply topically 2 (two) times daily., Disp: , Rfl:    ketoconazole (NIZORAL) 2 % cream, Apply 1 application topically daily., Disp: , Rfl:    meloxicam (MOBIC) 15 MG tablet, Take 1 tablet (15 mg total) by mouth daily., Disp: 90 tablet, Rfl: 1   nystatin ointment (MYCOSTATIN), nystatin 100,000 unit/gram topical ointment  Apply 1 application by topical route., Disp: , Rfl:    telmisartan (MICARDIS) 20 MG tablet, Take 1 tablet (20 mg total) by mouth daily., Disp: 90 tablet, Rfl: 1   Allergies  Allergen Reactions   Adhesive [Tape]    Amoxicillin    Augmentin [Amoxicillin-Pot Clavulanate] Rash    Drug eruption   Latex Rash    Adhesives products-causes  reddness, rash   Nabumetone     REACTION: Rash,Hives    Past Medical History:  Diagnosis Date   Acute medial meniscal tear 10/02/2011   right   Allergy    seasonal   Arthritis 09-27-11   Right knee/Cortisone injection 2'13   Breast cancer (HCC) 2006   hx of with rediation, surgery and adjuvant theapy with tamoxifen  (year two)   Colon polyps 2014   Hyperlipidemia    Hypothyroidism 09-27-11   Low function   IBS (irritable bowel syndrome)    Irritable bowel syndrome 05/19/2007   Qualifier: Diagnosis of  By: Lovell Sheehan MD, Balinda Quails    NEPHROLITHIASIS, HX OF 11/26/2006   Qualifier: Diagnosis of  By: Mayford Knife, LPN, Domenic Polite    OSTEOARTHROS UNSPEC WHETHER GEN/LOC [facility] SITE 02/28/2009   Qualifier: Diagnosis of  By: Lovell Sheehan MD, Balinda Quails    Sleep apnea 09-27-11   Cpap used ,started 7'8295     Past Surgical History:  Procedure Laterality Date   BREAST BIOPSY Right 1977   BREAST LUMPECTOMY Right 2006   raditon 33 tx-right   CATARACT EXTRACTION Bilateral 2008   CYSTOSCOPY  1977   DILATION AND CURETTAGE OF UTERUS  2011   done after thickening of uterine lining from tamoxifen   KNEE ARTHROSCOPY  10/02/2011   Procedure: ARTHROSCOPY KNEE;  Surgeon: Loanne Drilling, MD;  Location: [facility];  Service: Orthopedics;  Laterality: Right;  with Debridement, right knee medial meniscus   KNEE ARTHROSCOPY Left 05/2018   left knee arthroscopy  2020   MYOMECTOMY  1990   TONSILLECTOMY      Family History  Problem Relation Age of Onset   Hypertension Mother    Leukemia Mother    Diabetes Father    Uterine cancer Maternal Grandmother    Colon cancer Neg Hx     Social History   Tobacco Use   Smoking status: Never   Smokeless tobacco: Never  Vaping Use   Vaping Use: Never used  Substance Use Topics   Alcohol use: Yes    Comment: rare   Drug use: No    ROS   Objective:   Vitals: BP (!) 148/89 (BP Location: Left Arm)   Pulse 83   Temp 98.8 F (37.1 C) (Oral)   Resp 20   SpO2 95%    Physical Exam Constitutional:      General: She is not in acute distress.    Appearance: Normal appearance. She is well-developed. She is not ill-appearing, toxic-appearing or diaphoretic.  HENT:     Head: Normocephalic and atraumatic.     Nose: Nose normal.     Mouth/Throat:     Mouth: Mucous membranes are moist.  Eyes:     General: No scleral icterus.       Right eye: No discharge.        Left eye: No discharge.     Extraocular Movements: Extraocular movements intact.  Cardiovascular:     Rate and Rhythm: Normal rate and regular rhythm.     Heart sounds: Normal heart sounds. No murmur heard.    No friction rub. No gallop.  Pulmonary:     Effort: Pulmonary effort is normal. No respiratory distress.     Breath sounds: No stridor. No wheezing, rhonchi or rales.  Chest:     Chest wall: No tenderness.  Skin:    General: Skin is warm and dry.  Neurological:     General: No focal deficit present.     Mental Status: She is alert and oriented to person, place, and time.  Psychiatric:        Mood and Affect: Mood normal.        Behavior: Behavior normal.    DG Chest 2 View  Result Date: 09/15/2022 CLINICAL DATA:  Wheezing, cough, and dyspnea on exertion. EXAM: CHEST - 2 VIEW COMPARISON:  Chest radiographs 07/19/2013 FINDINGS: The cardiomediastinal silhouette is unchanged with normal heart size. No airspace consolidation, edema, pleural effusion, or pneumothorax is identified. No acute osseous abnormality is seen. IMPRESSION: No active cardiopulmonary disease. Electronically Signed   By: Sebastian Ache M.D.   On: 09/15/2022 11:27     Assessment and Plan :   PDMP not reviewed this encounter.  1. Acute bronchitis, unspecified organism   2. Acute cough    Recommended management for bronchitis with prednisone, supportive care.  Discussed possibility of pulmonary embolism and patient will maintain strict ER precautions.  Currently she has a low PERC score and therefore we will hold  off on ER visit for now. Counseled patient on potential for adverse effects with medications prescribed/recommended today, ER and return-to-clinic precautions discussed, patient verbalized understanding.    Wallis Bamberg, PA-C 09/15/22 1141

## 2022-09-15 NOTE — Discharge Instructions (Addendum)
I will call you and follow up with you regarding your x-ray results and the medications we will be using to help you.

## 2022-09-15 NOTE — ED Triage Notes (Signed)
Pt c/o dry cough, wheezing, DOE x 4 days-taking mucinex dm and saline nose spray-NAD-steady gait

## 2022-09-18 ENCOUNTER — Encounter: Payer: Self-pay | Admitting: Gastroenterology

## 2022-09-25 ENCOUNTER — Encounter: Payer: Self-pay | Admitting: Family Medicine

## 2022-09-25 ENCOUNTER — Ambulatory Visit (INDEPENDENT_AMBULATORY_CARE_PROVIDER_SITE_OTHER): Payer: Medicare Other | Admitting: Family Medicine

## 2022-09-25 VITALS — BP 128/84 | HR 71 | Temp 98.4°F | Wt 215.0 lb

## 2022-09-25 DIAGNOSIS — J40 Bronchitis, not specified as acute or chronic: Secondary | ICD-10-CM | POA: Diagnosis not present

## 2022-09-25 DIAGNOSIS — R0989 Other specified symptoms and signs involving the circulatory and respiratory systems: Secondary | ICD-10-CM

## 2022-09-25 LAB — POCT INFLUENZA A/B
Influenza A, POC: NEGATIVE
Influenza B, POC: NEGATIVE

## 2022-09-25 LAB — POC COVID19 BINAXNOW: SARS Coronavirus 2 Ag: NEGATIVE

## 2022-09-25 MED ORDER — BENZONATATE 200 MG PO CAPS: 200.0000 mg | ORAL_CAPSULE | Freq: Four times a day (QID) | ORAL | 0 refills | Status: DC | PRN

## 2022-09-25 MED ORDER — AZITHROMYCIN 250 MG PO TABS: ORAL_TABLET | ORAL | 0 refills | Status: DC

## 2022-09-25 NOTE — Progress Notes (Signed)
   Subjective:    Patient ID: Carleene Cooper, female    DOB: 04-22-1951, 72 y.o.   MRN: [id number]  HPI Here to follow up an urgent care visit on 09-15-22 for a stuffy head, ST, and a cough that was producing yellow sputum. No fever or SOB. At the clinic, she was told that her lungs sounded clear, and a CXR was clear. She was felt to have a viral URI, and she was given a Prednisone taper and some cough syrup. Now she says the cough is no better and it has been going on for 2 weeks.    Review of Systems  Constitutional: Negative.   HENT:  Positive for congestion. Negative for ear pain, postnasal drip, sinus pressure and sore throat.   Eyes: Negative.   Respiratory:  Positive for cough and wheezing. Negative for shortness of breath.        Objective:   Physical Exam Constitutional:      Appearance: Normal appearance. She is not ill-appearing.  HENT:     Right Ear: Tympanic membrane, ear canal and external ear normal.     Left Ear: Tympanic membrane, ear canal and external ear normal.     Nose: Nose normal.     Mouth/Throat:     Pharynx: Oropharynx is clear.  Eyes:     Conjunctiva/sclera: Conjunctivae normal.  Pulmonary:     Effort: Pulmonary effort is normal.     Breath sounds: Wheezing and rhonchi present. No rales.  Lymphadenopathy:     Cervical: No cervical adenopathy.  Neurological:     Mental Status: She is alert.           Assessment & Plan:  Bronchitis, we will treat with a Zpack. She can use Benzonatate for the cough. We spent a total of (32   ) minutes reviewing records and discussing these issues.  Gershon Crane, MD

## 2022-10-25 ENCOUNTER — Ambulatory Visit (AMBULATORY_SURGERY_CENTER): Payer: Medicare Other | Admitting: *Deleted

## 2022-10-25 VITALS — Ht 66.0 in | Wt 215.0 lb

## 2022-10-25 DIAGNOSIS — Z86010 Personal history of colonic polyps: Secondary | ICD-10-CM

## 2022-10-25 MED ORDER — NA SULFATE-K SULFATE-MG SULF 17.5-3.13-1.6 GM/177ML PO SOLN: 1.0000 | Freq: Once | ORAL | 0 refills | Status: AC

## 2022-10-25 NOTE — Progress Notes (Signed)
Pt's name and DOB verified at the beginning of the pre-visit.  Pt denies any difficulty with ambulating,sitting, laying down or rolling side to side Gave both [facility] main # and MD on call # prior to instructions.  No egg or soy allergy known to patient  No issues known to pt with past sedation with any surgeries or procedures Pt denies having issues being intubated Pt has no issues moving head neck or swallowing No FH of Malignant Hyperthermia Pt is not on diet pills Pt is not on home 02  Pt is not on blood thinners  Pt denies issues with constipation  Pt has frequent issues with constipation RN instructed pt to use Miralax per bottles instructions a week before prep days. Pt states they will Pt is not on dialysis Pt denise any abnormal heart rhythms  Pt denies any upcoming cardiac testing Pt encouraged to use to use Singlecare or Goodrx to reduce cost  Patient's chart reviewed by Cathlyn Parsons CNRA prior to pre-visit and patient appropriate for the [facility].  Pre-visit completed and red dot placed by patient's name on their procedure day (on provider's schedule).  . Visit by phone Pt states weight is 215l Important to and  to call if they have any changes in health or new medications. Directed them to the # given and on instructions.   Pt states they will.  Instructions reviewed with pt and pt states understanding. Instructed to review again prior to procedure. Pt states they will.  Instructions sent by mail with coupon and by my chart

## 2022-11-04 ENCOUNTER — Telehealth: Payer: Self-pay | Admitting: Gastroenterology

## 2022-11-04 NOTE — Telephone Encounter (Signed)
Inbound call from patient requesting a call back to discuss coding of colonoscopy. Please advise, thank you.

## 2022-11-14 ENCOUNTER — Encounter: Payer: Self-pay | Admitting: Gastroenterology

## 2022-11-14 ENCOUNTER — Ambulatory Visit (AMBULATORY_SURGERY_CENTER): Payer: Medicare Other | Admitting: Gastroenterology

## 2022-11-14 VITALS — BP 167/77 | HR 64 | Temp 97.1°F | Resp 17 | Ht 66.0 in | Wt 215.0 lb

## 2022-11-14 DIAGNOSIS — Z86010 Personal history of colonic polyps: Secondary | ICD-10-CM

## 2022-11-14 DIAGNOSIS — Z09 Encounter for follow-up examination after completed treatment for conditions other than malignant neoplasm: Secondary | ICD-10-CM | POA: Diagnosis not present

## 2022-11-14 DIAGNOSIS — D128 Benign neoplasm of rectum: Secondary | ICD-10-CM

## 2022-11-14 DIAGNOSIS — K621 Rectal polyp: Secondary | ICD-10-CM | POA: Diagnosis not present

## 2022-11-14 DIAGNOSIS — E039 Hypothyroidism, unspecified: Secondary | ICD-10-CM | POA: Diagnosis not present

## 2022-11-14 DIAGNOSIS — G4730 Sleep apnea, unspecified: Secondary | ICD-10-CM | POA: Diagnosis not present

## 2022-11-14 MED ORDER — SODIUM CHLORIDE 0.9 % IV SOLN
500.0000 mL | Freq: Once | INTRAVENOUS | Status: DC
Start: 2022-11-14 — End: 2022-11-14

## 2022-11-14 NOTE — Patient Instructions (Signed)
Handout provided about polyps, high fiber diet and diverticulosis.   Continue present medications.  Await pathology results.    YOU HAD AN ENDOSCOPIC PROCEDURE TODAY AT THE [facility]:   Refer to the procedure report that was given to you for any specific questions about what was found during the examination.  If the procedure report does not answer your questions, please call your gastroenterologist to clarify.  If you requested that your care partner not be given the details of your procedure findings, then the procedure report has been included in a sealed envelope for you to review at your convenience later.  YOU SHOULD EXPECT: Some feelings of bloating in the abdomen. Passage of more gas than usual.  Walking can help get rid of the air that was put into your GI tract during the procedure and reduce the bloating. If you had a lower endoscopy (such as a colonoscopy or flexible sigmoidoscopy) you may notice spotting of blood in your stool or on the toilet paper. If you underwent a bowel prep for your procedure, you may not have a normal bowel movement for a few days.  Please Note:  You might notice some irritation and congestion in your nose or some drainage.  This is from the oxygen used during your procedure.  There is no need for concern and it should clear up in a day or so.  SYMPTOMS TO REPORT IMMEDIATELY:  Following lower endoscopy (colonoscopy or flexible sigmoidoscopy):  Excessive amounts of blood in the stool  Significant tenderness or worsening of abdominal pains  Swelling of the abdomen that is new, acute  Fever of 100F or higher  For urgent or emergent issues, a gastroenterologist can be reached at any hour by calling [phone]. Do not use MyChart messaging for urgent concerns.    DIET:  We do recommend a small meal at first, but then you may proceed to your regular diet.  Drink plenty of fluids but you should avoid alcoholic beverages for 24 hours.  ACTIVITY:   You should plan to take it easy for the rest of today and you should NOT DRIVE or use heavy machinery until tomorrow (because of the sedation medicines used during the test).    FOLLOW UP: Our staff will call the number listed on your records the next business day following your procedure.  We will call around 7:15- 8:00 am to check on you and address any questions or concerns that you may have regarding the information given to you following your procedure. If we do not reach you, we will leave a message.     If any biopsies were taken you will be contacted by phone or by letter within the next 1-3 weeks.  Please call us at [phone] if you have not heard about the biopsies in 3 weeks.    SIGNATURES/CONFIDENTIALITY: You and/or your care partner have signed paperwork which will be entered into your electronic medical record.  These signatures attest to the fact that that the information above on your After Visit Summary has been reviewed and is understood.  Full responsibility of the confidentiality of this discharge information lies with you and/or your care-partner.

## 2022-11-14 NOTE — Progress Notes (Signed)
Pt's states no medical or surgical changes since previsit or office visit. 

## 2022-11-14 NOTE — Progress Notes (Signed)
History & Physical  Primary Care Physician:  Karie Georges, MD Primary Gastroenterologist: Claudette Head, MD  Impression / Plan:  Personal history of adenomatous colon polyps for colonoscopy   CHIEF COMPLAINT: Personal history of colon polyps   HPI: Jacqueline Jennings is a 72 y.o. female with a personal history of adenomatous colon polyps for colonoscopy.    Past Medical History:  Diagnosis Date   Acute medial meniscal tear 10/02/2011   right   Allergy    seasonal   Arthritis 09/27/2011   Right knee/Cortisone injection 2'13   Breast cancer (HCC) 2006   hx of with rediation, surgery and adjuvant theapy with tamoxifen  (year two)   Cataract    removed   Chronic kidney disease    Kidney stones   Colon polyps 2014   Hyperlipidemia    Hypothyroidism 09/27/2011   Low function   IBS (irritable bowel syndrome)    Irritable bowel syndrome 05/19/2007   Qualifier: Diagnosis of  By: Lovell Sheehan MD, Balinda Quails    NEPHROLITHIASIS, HX OF 11/26/2006   Qualifier: Diagnosis of  By: Mayford Knife, LPN, Domenic Polite    OSTEOARTHROS UNSPEC WHETHER GEN/LOC [facility] SITE 02/28/2009   Qualifier: Diagnosis of  By: Lovell Sheehan MD, Balinda Quails    Sleep apnea 09/27/2011   Cpap used ,started 1'3086    Past Surgical History:  Procedure Laterality Date   BREAST BIOPSY Right 1977   BREAST LUMPECTOMY Right 2006   raditon 33 tx-right   CATARACT EXTRACTION Bilateral 2008   COLONOSCOPY     CYSTOSCOPY  1977   DILATION AND CURETTAGE OF UTERUS  2011   done after thickening of uterine lining from tamoxifen   KNEE ARTHROSCOPY  10/02/2011   Procedure: ARTHROSCOPY KNEE;  Surgeon: Loanne Drilling, MD;  Location: [facility];  Service: Orthopedics;  Laterality: Right;  with Debridement, right knee medial meniscus   KNEE ARTHROSCOPY Left 05/2018   left knee arthroscopy  2020   MYOMECTOMY  1990   TONSILLECTOMY      Prior to Admission medications   Medication Sig Start Date End Date Taking? Authorizing Provider   acetaminophen (TYLENOL) 650 MG CR tablet Take 650 mg by mouth as needed for pain.   Yes [provider]  Cholecalciferol (VITAMIN D-3 PO) Take 6,000 Units by mouth daily.   Yes [provider]  COLLAGEN PO Take 12 g by mouth daily.   Yes [provider]  fexofenadine (ALLEGRA) 180 MG tablet Take 180 mg by mouth daily.   Yes [provider]  fluticasone (CUTIVATE) 0.05 % cream Apply topically 2 (two) times daily.   Yes [provider]  meloxicam (MOBIC) 15 MG tablet Take 1 tablet (15 mg total) by mouth daily. 09/10/22  Yes Karie Georges, MD  thyroid [facility] THYROID) 60 MG tablet Take 60 mg by mouth.   Yes [provider]  fluocinonide cream (LIDEX) 0.05 % fluocinonide 0.05 % topical cream  APPLY TO THE AFFECTED AREA(S) BY TOPICAL ROUTE 2 TIMES PER DAY Patient not taking: Reported on 11/14/2022    [provider]  ketoconazole (NIZORAL) 2 % cream Apply 1 application topically daily. Patient not taking: Reported on 11/14/2022    [provider]  nystatin ointment (MYCOSTATIN) nystatin 100,000 unit/gram topical ointment  Apply 1 application by topical route. Patient not taking: Reported on 11/14/2022    [provider]  telmisartan (MICARDIS) 20 MG tablet Take 1 tablet (20 mg total) by mouth daily. Patient not taking:  Reported on 10/25/2022 09/10/22   Karie Georges, MD    Current Outpatient Medications  Medication Sig Dispense Refill   acetaminophen (TYLENOL) 650 MG CR tablet Take 650 mg by mouth as needed for pain.     Cholecalciferol (VITAMIN D-3 PO) Take 6,000 Units by mouth daily.     COLLAGEN PO Take 12 g by mouth daily.     fexofenadine (ALLEGRA) 180 MG tablet Take 180 mg by mouth daily.     fluticasone (CUTIVATE) 0.05 % cream Apply topically 2 (two) times daily.     meloxicam (MOBIC) 15 MG tablet Take 1 tablet (15 mg total) by mouth daily. 90 tablet 1   thyroid (ARMOUR THYROID) 60 MG tablet Take 60 mg  by mouth.     fluocinonide cream (LIDEX) 0.05 % fluocinonide 0.05 % topical cream  APPLY TO THE AFFECTED AREA(S) BY TOPICAL ROUTE 2 TIMES PER DAY (Patient not taking: Reported on 11/14/2022)     ketoconazole (NIZORAL) 2 % cream Apply 1 application topically daily. (Patient not taking: Reported on 11/14/2022)     nystatin ointment (MYCOSTATIN) nystatin 100,000 unit/gram topical ointment  Apply 1 application by topical route. (Patient not taking: Reported on 11/14/2022)     telmisartan (MICARDIS) 20 MG tablet Take 1 tablet (20 mg total) by mouth daily. (Patient not taking: Reported on 10/25/2022) 90 tablet 1   Current Facility-Administered Medications  Medication Dose Route Frequency Provider Last Rate Last Admin   0.9 %  sodium chloride infusion  500 mL Intravenous Once Meryl Dare, MD        Allergies as of 11/14/2022 - Review Complete 11/14/2022  Allergen Reaction Noted   Adhesive [tape]  07/17/2018   Amoxicillin  02/25/2020   Augmentin [amoxicillin-pot clavulanate] Rash 12/20/2014   Latex Rash 09/27/2011   Nabumetone      Family History  Problem Relation Age of Onset   Hypertension Mother    Leukemia Mother    Diabetes Father    Uterine cancer Maternal Grandmother    Colon cancer Neg Hx    Esophageal cancer Neg Hx    Colon polyps Neg Hx    Rectal cancer Neg Hx    Stomach cancer Neg Hx     Social History   Socioeconomic History   Marital status: Widowed    Spouse name: Not on file   Number of children: 0   Years of education: 16   Highest education level: Bachelor's degree (e.g., BA, AB, BS)  Occupational History   Occupation: retired  Tobacco Use   Smoking status: Never   Smokeless tobacco: Never  Vaping Use   Vaping status: Never Used  Substance and Sexual Activity   Alcohol use: Yes    Comment: rare   Drug use: No   Sexual activity: Yes  Other Topics Concern   Not on file  Social History Narrative   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, travels a lot       widowed      Spiritual Beliefs: Christian               Social Determinants of Health   Financial Resource Strain: Low Risk  (09/24/2022)   Overall Financial Resource Strain (CARDIA)    Difficulty of Paying Living Expenses: Not hard at all  Food Insecurity: No Food Insecurity (09/24/2022)   Hunger Vital Sign    Worried About Running Out of Food in the Last Year: Never true    Ran Out of Food in  the Last Year: Never true  Transportation Needs: No Transportation Needs (09/24/2022)   PRAPARE - Administrator, Civil Service (Medical): No    Lack of Transportation (Non-Medical): No  Physical Activity: Unknown (09/24/2022)   Exercise Vital Sign    Days of Exercise per Week: Patient declined    Minutes of Exercise per Session: 60 min  Stress: No Stress Concern Present (09/24/2022)   [employer] of Occupational Health - Occupational Stress Questionnaire    Feeling of Stress : Not at all  Social Connections: Moderately Integrated (09/24/2022)   Social Connection and Isolation Panel [NHANES]    Frequency of Communication with Friends and Family: More than three times a week    Frequency of Social Gatherings with Friends and Family: More than three times a week    Attends Religious Services: More than 4 times per year    Active Member of [employer] or Organizations: Yes    Attends Banker Meetings: More than 4 times per year    Marital Status: Widowed  Intimate Partner Violence: Not At Risk (03/07/2022)   Humiliation, Afraid, Rape, and Kick questionnaire    Fear of Current or Ex-Partner: No    Emotionally Abused: No    Physically Abused: No    Sexually Abused: No    Review of Systems:  All systems reviewed were negative except where noted in HPI.   Physical Exam:  General:  Alert, well-developed, in NAD Head:  Normocephalic and atraumatic. Eyes:  Sclera clear, no icterus.   Conjunctiva pink. Ears:  Normal auditory acuity. Mouth:  No deformity or lesions.   Neck:  Supple; no masses. Lungs:  Clear throughout to auscultation.   No wheezes, crackles, or rhonchi.  Heart:  Regular rate and rhythm; no murmurs. Abdomen:  Soft, nondistended, nontender. No masses, hepatomegaly. No palpable masses.  Normal bowel sounds.    Rectal:  Deferred   Msk:  Symmetrical without gross deformities. Extremities:  Without edema. Neurologic:  Alert and  oriented x 4; grossly normal neurologically. Skin:  Intact without significant lesions or rashes. Psych:  Alert and cooperative. Normal mood and affect.  Venita Lick. Russella Dar  11/14/2022, 7:59 AM See Loretha Stapler, [facility], to contact our on call provider

## 2022-11-14 NOTE — Op Note (Signed)
[facility] Patient Name: Jacqueline Jennings Procedure Date: 11/14/2022 8:05 AM MRN: [id number] Endoscopist: Meryl Dare , MD, [phone] Age: 72 Referring MD:  Date of Birth: 1951/01/14 Gender: Female Account #: [id number] Procedure:                Colonoscopy Indications:              Surveillance: Personal history of adenomatous                            polyps, last colonoscopy 5 years ago Medicines:                Monitored Anesthesia Care Procedure:                Pre-Anesthesia Assessment:                           - Prior to the procedure, a History and Physical                            was performed, and patient medications and                            allergies were reviewed. The patient's tolerance of                            previous anesthesia was also reviewed. The risks                            and benefits of the procedure and the sedation                            options and risks were discussed with the patient.                            All questions were answered, and informed consent                            was obtained. Prior Anticoagulants: The patient has                            taken no anticoagulant or antiplatelet agents. ASA                            Grade Assessment: III - A patient with severe                            systemic disease. After reviewing the risks and                            benefits, the patient was deemed in satisfactory                            condition to undergo the procedure.  After obtaining informed consent, the colonoscope                            was passed under direct vision. Throughout the                            procedure, the patient's blood pressure, pulse, and                            oxygen saturations were monitored continuously. The                            Olympus Scope SN: [id number] was introduced through                            the anus and advanced to  the the cecum, identified                            by appendiceal orifice and ileocecal valve. The                            ileocecal valve, appendiceal orifice, and rectum                            were photographed. The quality of the bowel                            preparation was excellent. The colonoscopy was                            performed without difficulty. The patient tolerated                            the procedure well. Scope In: 8:09:06 AM Scope Out: 8:22:42 AM Scope Withdrawal Time: 0 hours 11 minutes 26 seconds  Total Procedure Duration: 0 hours 13 minutes 36 seconds  Findings:                 The perianal and digital rectal examinations were                            normal.                           A 6 mm polyp was found in the distal rectum. The                            polyp was sessile. The polyp was removed with a                            cold snare. Resection and retrieval were complete.                           Multiple medium-mouthed and small-mouthed  diverticula were found in the left colon.                           The exam was otherwise without abnormality on                            direct and retroflexion views. Complications:            No immediate complications. Estimated blood loss:                            None. Estimated Blood Loss:     Estimated blood loss: none. Impression:               - One 6 mm polyp in the distal rectum, removed with                            a cold snare. Resected and retrieved.                           - Diverticulosis in the left colon.                           - The examination was otherwise normal on direct                            and retroflexion views. Recommendation:           - Repeat colonoscopy vs no repeat due to age after                            studies are complete for surveillance based on                            pathology results.                            - Patient has a contact number available for                            emergencies. The signs and symptoms of potential                            delayed complications were discussed with the                            patient. Return to normal activities tomorrow.                            Written discharge instructions were provided to the                            patient.                           - High fiber diet.                           -  Continue present medications.                           - Await pathology results. Meryl Dare, MD 11/14/2022 8:29:08 AM This report has been signed electronically.

## 2022-11-14 NOTE — Progress Notes (Signed)
A/ox3, pleased with MAC, report to RN 

## 2022-11-14 NOTE — Progress Notes (Signed)
Called to room to assist during endoscopic procedure.  Patient ID and intended procedure confirmed with present staff. Received instructions for my participation in the procedure from the performing physician.  

## 2022-11-15 ENCOUNTER — Telehealth: Payer: Self-pay

## 2022-11-15 NOTE — Telephone Encounter (Signed)
  Follow up Call-     11/14/2022    7:16 AM  Call back number  Post procedure Call Back phone  # [phone]  Permission to leave phone message Yes     Patient questions:  Do you have a fever, pain , or abdominal swelling? No. Pain Score  0 *  Have you tolerated food without any problems? Yes.    Have you been able to return to your normal activities? Yes.    Do you have any questions about your discharge instructions: Diet   No. Medications  No. Follow up visit  No.  Do you have questions or concerns about your Care? No.  Actions: * If pain score is 4 or above: No action needed, pain <4.

## 2022-11-29 ENCOUNTER — Encounter: Payer: Self-pay | Admitting: Gastroenterology

## 2022-11-29 ENCOUNTER — Telehealth: Payer: Self-pay | Admitting: Gastroenterology

## 2022-11-29 NOTE — Telephone Encounter (Signed)
The pt has lots of questions regarding her recent colonoscopy and whether or not she will need another colon.  She prefers to come in and discuss with Dr Russella Dar. Appt has been made.

## 2022-11-29 NOTE — Telephone Encounter (Signed)
Inbound call from patient requesting a call to discuss if she needs another colonoscopy. Patient stated that on her letter she did not see where she needs to return for another colon. I advised patient It looks like she does not have to have another procedure. Patient stated she is not sure if she is comfortable with not having another. Please advise.

## 2022-12-16 DIAGNOSIS — Z1231 Encounter for screening mammogram for malignant neoplasm of breast: Secondary | ICD-10-CM | POA: Diagnosis not present

## 2022-12-16 LAB — HM MAMMOGRAPHY

## 2022-12-17 ENCOUNTER — Encounter: Payer: Self-pay | Admitting: Family Medicine

## 2022-12-24 DIAGNOSIS — H52203 Unspecified astigmatism, bilateral: Secondary | ICD-10-CM | POA: Diagnosis not present

## 2022-12-24 DIAGNOSIS — H524 Presbyopia: Secondary | ICD-10-CM | POA: Diagnosis not present

## 2022-12-24 DIAGNOSIS — Z961 Presence of intraocular lens: Secondary | ICD-10-CM | POA: Diagnosis not present

## 2022-12-24 DIAGNOSIS — H43813 Vitreous degeneration, bilateral: Secondary | ICD-10-CM | POA: Diagnosis not present

## 2022-12-24 DIAGNOSIS — H04123 Dry eye syndrome of bilateral lacrimal glands: Secondary | ICD-10-CM | POA: Diagnosis not present

## 2023-03-03 ENCOUNTER — Ambulatory Visit: Payer: Medicare Other | Admitting: Gastroenterology

## 2023-03-05 ENCOUNTER — Ambulatory Visit: Payer: BLUE CROSS/BLUE SHIELD | Admitting: Family Medicine

## 2023-03-05 DIAGNOSIS — L820 Inflamed seborrheic keratosis: Secondary | ICD-10-CM | POA: Diagnosis not present

## 2023-03-05 DIAGNOSIS — Z1283 Encounter for screening for malignant neoplasm of skin: Secondary | ICD-10-CM | POA: Diagnosis not present

## 2023-03-05 DIAGNOSIS — L818 Other specified disorders of pigmentation: Secondary | ICD-10-CM | POA: Diagnosis not present

## 2023-03-05 DIAGNOSIS — L304 Erythema intertrigo: Secondary | ICD-10-CM | POA: Diagnosis not present

## 2023-03-06 ENCOUNTER — Ambulatory Visit: Payer: Medicare Other | Admitting: Family Medicine

## 2023-03-06 ENCOUNTER — Encounter: Payer: Self-pay | Admitting: Family Medicine

## 2023-03-06 VITALS — BP 132/80 | HR 90 | Temp 98.3°F | Ht 66.0 in | Wt 211.5 lb

## 2023-03-06 DIAGNOSIS — M1990 Unspecified osteoarthritis, unspecified site: Secondary | ICD-10-CM | POA: Diagnosis not present

## 2023-03-06 DIAGNOSIS — R739 Hyperglycemia, unspecified: Secondary | ICD-10-CM | POA: Diagnosis not present

## 2023-03-06 DIAGNOSIS — I10 Essential (primary) hypertension: Secondary | ICD-10-CM

## 2023-03-06 DIAGNOSIS — E785 Hyperlipidemia, unspecified: Secondary | ICD-10-CM | POA: Diagnosis not present

## 2023-03-06 DIAGNOSIS — Z23 Encounter for immunization: Secondary | ICD-10-CM

## 2023-03-06 LAB — LIPID PANEL
Cholesterol: 197 mg/dL (ref 0–200)
HDL: 48.1 mg/dL (ref >39.00)
LDL Cholesterol: 121 mg/dL — ABNORMAL HIGH (ref 0–99)
NonHDL: 149.26
Total CHOL/HDL Ratio: 4
Triglycerides: 139 mg/dL (ref 0.0–149.0)
VLDL: 27.8 mg/dL (ref 0.0–40.0)

## 2023-03-06 LAB — HEMOGLOBIN A1C: Hgb A1c MFr Bld: 6.1 % (ref 4.6–6.5)

## 2023-03-06 MED ORDER — TELMISARTAN 20 MG PO TABS
10.0000 mg | ORAL_TABLET | Freq: Every day | ORAL | 1 refills | Status: AC
Start: 2023-03-06 — End: ?

## 2023-03-06 MED ORDER — MELOXICAM 15 MG PO TABS
15.0000 mg | ORAL_TABLET | Freq: Every day | ORAL | 1 refills | Status: AC
Start: 2023-03-06 — End: ?

## 2023-03-06 NOTE — Progress Notes (Signed)
Established Patient Office Visit  Subjective   Patient ID: Jacqueline Jennings, female    DOB: 09/15/1950  Age: 72 y.o. MRN: [id number]  Chief Complaint  Patient presents with   Medical Management of Chronic Issues    Patient states she only took Telmisartan once or twice as she felt she did not need the medication due to normal BP readings at home    Pt is here for follow up today on her blood pressure. She reports she only took the telmisartan temporarily and her BP improved and so she thought she didn't need to take the medication. I counseled patient on HTN and the fact that it is a chronic condition, not likely to resolve on its own.   HLD -- needs new lipid panel this year. Not currently on medication for this.   Arthritis-pt reports that she is taking the meloxicam daily. States that she read this medication can increase blood pressure, we discussed the risks/benefits of continue NSAIDS.     Current Outpatient Medications  Medication Instructions   acetaminophen (TYLENOL) 650 mg, Oral, As needed   Cholecalciferol (VITAMIN D-3 PO) 6,000 Units, Oral, Daily   COLLAGEN PO 12 g, Oral, Daily   fexofenadine (ALLEGRA) 180 mg, Oral, Daily   fluocinonide cream (LIDEX) 0.05 %    fluticasone (CUTIVATE) 0.05 % cream Topical, 2 times daily   ketoconazole (NIZORAL) 2 % cream 1 application , Topical, Daily   meloxicam (MOBIC) 15 mg, Oral, Daily   nystatin ointment (MYCOSTATIN)    telmisartan (MICARDIS) 10 mg, Oral, Daily   thyroid (ARMOUR THYROID) 60 mg, Oral    Patient Active Problem List   Diagnosis Date Noted   HTN (hypertension) 02/27/2022   Malignant tumor of breast (HCC) 06/20/2020   Arthritis 06/20/2020   Rosacea 08/17/2018   Tear of medial meniscus of knee 06/15/2018   Pain in left knee 12/16/2017   B12 deficiency 03/08/2016   After cataract not obscuring vision 01/19/2013   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     Objective:     BP 132/80 (BP Location: Left Arm, Patient Position: Sitting, Cuff Size: Large)   Pulse 90   Temp 98.3 F (36.8 C) (Oral)   Ht 5\' 6"  (1.676 m)   Wt 211 lb 8 oz (95.9 kg)   SpO2 99%   BMI 34.14 kg/m    Physical Exam Vitals reviewed.  Constitutional:      Appearance: Normal appearance. She is well-groomed and normal weight.  Eyes:     Conjunctiva/sclera: Conjunctivae normal.  Neck:     Thyroid: No thyromegaly.  Cardiovascular:     Rate and Rhythm: Normal rate and regular rhythm.     Pulses: Normal pulses.     Heart sounds: S1 normal and S2 normal.  Pulmonary:     Effort: Pulmonary effort is normal.     Breath sounds: Normal breath sounds and air entry.  Abdominal:     General: Bowel sounds are normal.  Musculoskeletal:     Right lower leg: No edema.     Left lower leg: No edema.  Neurological:  Mental Status: She is alert and oriented to person, place, and time. Mental status is at baseline.     Gait: Gait is intact.  Psychiatric:        Mood and Affect: Mood and affect normal.        Speech: Speech normal.        Behavior: Behavior normal.        Judgment: Judgment normal.      The 10-year ASCVD risk score (Arnett DK, et al., 2019) is: 15%    Assessment & Plan:  Hyperlipidemia, unspecified hyperlipidemia type Assessment & Plan: Needs new lipid panel today for surveillance  Orders: -     Lipid panel  Primary hypertension Assessment & Plan: BP is better, I recommended she continue taking the telmisartan, 1/2 tablet daily. RTC in 6 months for follow up   Orders: -     Telmisartan; Take 0.5 tablets (10 mg  total) by mouth daily.  Dispense: 45 tablet; Refill: 1  Arthritis Assessment & Plan: We discussed the risks/benefits of taking chronic NSAIDS, that increasing her functionality and physical activity is worth the risk of elevated blood pressure, especially since the telmisartan is working well for her.   Orders: -     Meloxicam; Take 1 tablet (15 mg total) by mouth daily.  Dispense: 90 tablet; Refill: 1  Hyperglycemia -     Hemoglobin A1c  Need for immunization against influenza -     Flu Vaccine Trivalent High Dose (Fluad)     Return in about 6 months (around 09/03/2023).    Karie Georges, MD

## 2023-03-06 NOTE — Patient Instructions (Addendum)
Bladder empty Sitting feet flat on the floor for 5 minutes Prop the elbow on the kitchen table  Take telmisartan at nighttime.

## 2023-03-10 NOTE — Assessment & Plan Note (Signed)
Needs new lipid panel today for surveillance.

## 2023-03-10 NOTE — Assessment & Plan Note (Signed)
BP is better, I recommended she continue taking the telmisartan, 1/2 tablet daily. RTC in 6 months for follow up

## 2023-03-10 NOTE — Assessment & Plan Note (Signed)
We discussed the risks/benefits of taking chronic NSAIDS, that increasing her functionality and physical activity is worth the risk of elevated blood pressure, especially since the telmisartan is working well for her.

## 2023-03-11 ENCOUNTER — Encounter: Payer: Medicare Other | Admitting: Family Medicine

## 2023-03-12 ENCOUNTER — Encounter: Payer: Self-pay | Admitting: Family Medicine

## 2023-03-17 ENCOUNTER — Ambulatory Visit: Payer: Medicare Other | Admitting: Gastroenterology

## 2023-03-26 DIAGNOSIS — Z124 Encounter for screening for malignant neoplasm of cervix: Secondary | ICD-10-CM | POA: Diagnosis not present

## 2023-03-26 DIAGNOSIS — Z779 Other contact with and (suspected) exposures hazardous to health: Secondary | ICD-10-CM | POA: Diagnosis not present

## 2023-03-26 DIAGNOSIS — Z853 Personal history of malignant neoplasm of breast: Secondary | ICD-10-CM | POA: Diagnosis not present

## 2023-03-26 DIAGNOSIS — Z6834 Body mass index (BMI) 34.0-34.9, adult: Secondary | ICD-10-CM | POA: Diagnosis not present

## 2023-03-28 ENCOUNTER — Telehealth: Payer: Self-pay | Admitting: Internal Medicine

## 2023-03-28 ENCOUNTER — Ambulatory Visit: Payer: Medicare Other | Admitting: Internal Medicine

## 2023-03-28 MED ORDER — THYROID 60 MG PO TABS: 60.0000 mg | ORAL_TABLET | Freq: Every day | ORAL | 0 refills | Status: DC

## 2023-03-28 NOTE — Telephone Encounter (Signed)
Requested Prescriptions   Signed Prescriptions Disp Refills   thyroid (ARMOUR THYROID) 60 MG tablet 30 tablet 0    Sig: Take 1 tablet (60 mg total) by mouth daily before breakfast.    Authorizing Provider: Carlus Pavlov    Ordering User: Pollie Meyer

## 2023-03-28 NOTE — Telephone Encounter (Signed)
Patient has only 2 days of Armour thyroid left - is requesting a refill be sent to [phone] - [place], [place] - [address]

## 2023-04-03 DIAGNOSIS — M1712 Unilateral primary osteoarthritis, left knee: Secondary | ICD-10-CM | POA: Diagnosis not present

## 2023-04-04 ENCOUNTER — Ambulatory Visit (INDEPENDENT_AMBULATORY_CARE_PROVIDER_SITE_OTHER): Payer: Medicare Other | Admitting: Internal Medicine

## 2023-04-04 ENCOUNTER — Encounter: Payer: Self-pay | Admitting: Internal Medicine

## 2023-04-04 VITALS — BP 124/78 | HR 83 | Ht 66.0 in | Wt 212.0 lb

## 2023-04-04 DIAGNOSIS — E559 Vitamin D deficiency, unspecified: Secondary | ICD-10-CM | POA: Diagnosis not present

## 2023-04-04 DIAGNOSIS — M85852 Other specified disorders of bone density and structure, left thigh: Secondary | ICD-10-CM | POA: Diagnosis not present

## 2023-04-04 DIAGNOSIS — E039 Hypothyroidism, unspecified: Secondary | ICD-10-CM | POA: Diagnosis not present

## 2023-04-04 DIAGNOSIS — M85851 Other specified disorders of bone density and structure, right thigh: Secondary | ICD-10-CM | POA: Diagnosis not present

## 2023-04-04 MED ORDER — ARMOUR THYROID 60 MG PO TABS: 60.0000 mg | ORAL_TABLET | Freq: Every day | ORAL | 1 refills | Status: DC

## 2023-04-04 NOTE — Progress Notes (Signed)
Patient ID: Jacqueline Jennings, female   DOB: 11/10/1950, 72 y.o.   MRN: [id number]  HPI  [name] is a 72 y.o.-year-old female, returning for follow-up for hypothyroidism and B12 deficiency. Last visit 1 year ago.  Interim history: No falls or fractures since last visit.  No dizziness, disequilibrium, vertigo. She continues to have joint pains (knees). She had an OA yesterday - steroid inj.  Reviewed history: Pt. has been dx with hypothyroidism in 2007-2008; started on Synthroid >> not feeling better >> was on Armour >> now Naturethyroid 65 mg.  Her TSH was overly suppressed in the past, as shown below. She was initially on a high dose of Armour: 120 mg, which we subsequently decreased to 90 mg, and then further to 60 mg >> currently on Nature-Throid.  Labs normalized and remained controlled afterwards.  She was on Nature-Throid 65 mg >> now Armour 60 mg daily (due to availability): - in am - fasting - at least 30 min from b'fast and coffee with collagen - added calcium 500 mg + D3  - no iron - added multivitamins with D3 - no PPIs - not on Biotin  Reviewed her TFTs: Lab Results  Component Value Date   TSH 2.89 09/11/2022   TSH 2.14 01/03/2022   TSH 2.35 08/15/2021   TSH 1.67 03/07/2021   TSH 1.88 06/30/2020   TSH 2.17 11/24/2019   TSH 3.18 02/15/2019   TSH 2.48 02/17/2018   TSH 2.88 03/07/2017   TSH 1.60 03/08/2016   FREET4 0.76 01/03/2022   FREET4 0.64 03/07/2021   FREET4 0.79 06/30/2020   FREET4 0.75 02/15/2019   FREET4 0.79 02/17/2018   FREET4 0.74 03/07/2017   FREET4 0.62 03/08/2016   FREET4 0.67 03/09/2015   FREET4 0.69 09/08/2014   FREET4 0.79 05/24/2014    Pt denies: - feeling nodules in neck - hoarseness - dysphagia - choking  Osteopenia: 03/29/2022 ([facility]) Lumbar spine L1-L4(L3) Femoral neck (FN)  T-score -0.5 RFN: -1.3 LFN: -1.7  Change in BMD from previous DXA test (%) Down 3.7%* Down 6.2%*  (*) statistically significant    FRAX  10-year fracture risk calculator: 15.7 % for any major fracture and 2.5 % for hip fracture. Pharmacologic therapy is recommended if 10 year fracture risk is >20% for any major osteoporotic fracture or >3% for hip fracture.   L3 vertebra had to be excluded from analysis due to DJD  12/07/2019 Lumbar spine L1-L4 Femoral neck (FN)  T-score -0.1 RFN: -1.1 LFN: -1.2  Change in BMD from previous DXA test (%) +6.4%* -7.0%*  (*) statistically significant  After the latest results returned in 2023, I suggested Fosamax 35 mg weekly. She wanted to wait until next BMD to see if we need need to start.  She is starting to exercise at the gym since last visit.   Vitamin D deficiency:  She was on 2000, then 4000 units vitamin D daily, then 4000 units - drops. She added vitamin D in MVI and Ca-vit D (total 5500 units a day)  Vitamin D levels reviewed: Lab Results  Component Value Date   VD25OH 36.30 09/11/2022   VD25OH 32.71 01/03/2022   VD25OH 25.25 (L) 08/15/2021   VD25OH 22.93 (L) 08/02/2021   VD25OH 21.43 (L) 05/29/2021   B12 deficiency - After starting 5000 mcg of methylcobalamin daily, her B12 was higher than target so the dose was decreased to 5000 mcg every other day.  She was on 1000 mcg Me-B12 daily and on this dose  her B12 levels have been normal.  She tells me that she is now off B12.  Lab Results  Component Value Date   VITAMINB12 275 09/11/2022   VITAMINB12 512 08/15/2021   VITAMINB12 497 03/27/2021   VITAMINB12 844 06/30/2020   VITAMINB12 459 11/24/2019   VITAMINB12 608 02/15/2019   VITAMINB12 1,282 (H) 02/17/2018   VITAMINB12 982 (H) 02/14/2017   VITAMINB12 759 06/07/2016   VITAMINB12 298 02/22/2016   VITAMINB12 337 03/09/2015   VITAMINB12 280 12/14/2014   VITAMINB12 210 (L) 09/14/2014   She also has a history of BrCa 2006 >> had radiation therapy to right breast.  She lost her husband in 07/2016 due to esophageal cancer.  She was undergoing grief counseling.   ROS: +  see HPI  I reviewed pt's medications, allergies, PMH, social hx, family hx, and changes were documented in the history of present illness. Otherwise, unchanged from my initial visit note.  Past Medical History:  Diagnosis Date   Acute medial meniscal tear 10/02/2011   right   Allergy    seasonal   Arthritis 09/27/2011   Right knee/Cortisone injection 2'13   Breast cancer (HCC) 2006   hx of with rediation, surgery and adjuvant theapy with tamoxifen  (year two)   Cataract    removed   Chronic kidney disease    Kidney stones   Colon polyps 2014   Hyperlipidemia    Hypothyroidism 09/27/2011   Low function   IBS (irritable bowel syndrome)    Irritable bowel syndrome 05/19/2007   Qualifier: Diagnosis of  By: Lovell Sheehan MD, Balinda Quails    NEPHROLITHIASIS, HX OF 11/26/2006   Qualifier: Diagnosis of  By: Mayford Knife, LPN, Domenic Polite    OSTEOARTHROS UNSPEC WHETHER GEN/LOC [facility] SITE 02/28/2009   Qualifier: Diagnosis of  By: Lovell Sheehan MD, Balinda Quails    Sleep apnea 09/27/2011   Cpap used ,started 5'6213   Past Surgical History:  Procedure Laterality Date   BREAST BIOPSY Right 1977   BREAST LUMPECTOMY Right 2006   raditon 33 tx-right   CATARACT EXTRACTION Bilateral 2008   COLONOSCOPY     CYSTOSCOPY  1977   DILATION AND CURETTAGE OF UTERUS  2011   done after thickening of uterine lining from tamoxifen   KNEE ARTHROSCOPY  10/02/2011   Procedure: ARTHROSCOPY KNEE;  Surgeon: Loanne Drilling, MD;  Location: [facility];  Service: Orthopedics;  Laterality: Right;  with Debridement, right knee medial meniscus   KNEE ARTHROSCOPY Left 05/2018   left knee arthroscopy  2020   MYOMECTOMY  1990   TONSILLECTOMY     Social History   Socioeconomic History   Marital status: Widowed    Spouse name: Not on file   Number of children: 0   Years of education: 16   Highest education level: Bachelor's degree (e.g., BA, AB, BS)  Occupational History   Occupation: retired  Tobacco Use   Smoking status: Never    Smokeless tobacco: Never  Vaping Use   Vaping status: Never Used  Substance and Sexual Activity   Alcohol use: Yes    Comment: rare   Drug use: No   Sexual activity: Yes  Other Topics Concern   Not on file  Social History Narrative   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, travels a lot      widowed      Spiritual Beliefs: Christian               Social Drivers of Dispensing optician  Resource Strain: Low Risk  (03/05/2023)   Overall Financial Resource Strain (CARDIA)    Difficulty of Paying Living Expenses: Not hard at all  Food Insecurity: No Food Insecurity (03/05/2023)   Hunger Vital Sign    Worried About Running Out of Food in the Last Year: Never true    Ran Out of Food in the Last Year: Never true  Transportation Needs: No Transportation Needs (03/05/2023)   PRAPARE - Administrator, Civil Service (Medical): No    Lack of Transportation (Non-Medical): No  Physical Activity: Sufficiently Active (03/05/2023)   Exercise Vital Sign    Days of Exercise per Week: 5 days    Minutes of Exercise per Session: 30 min  Stress: No Stress Concern Present (03/05/2023)   [employer] of Occupational Health - Occupational Stress Questionnaire    Feeling of Stress : Not at all  Social Connections: Moderately Integrated (03/05/2023)   Social Connection and Isolation Panel [NHANES]    Frequency of Communication with Friends and Family: More than three times a week    Frequency of Social Gatherings with Friends and Family: Three times a week    Attends Religious Services: More than 4 times per year    Active Member of Clubs or Organizations: Yes    Attends Banker Meetings: More than 4 times per year    Marital Status: Widowed  Intimate Partner Violence: Not At Risk (03/07/2022)   Humiliation, Afraid, Rape, and Kick questionnaire    Fear of Current or Ex-Partner: No    Emotionally Abused: No    Physically Abused: No    Sexually Abused: No   Current  Outpatient Medications on File Prior to Visit  Medication Sig Dispense Refill   acetaminophen (TYLENOL) 650 MG CR tablet Take 650 mg by mouth as needed for pain.     Cholecalciferol (VITAMIN D-3 PO) Take 6,000 Units by mouth daily.     COLLAGEN PO Take 12 g by mouth daily.     fexofenadine (ALLEGRA) 180 MG tablet Take 180 mg by mouth daily.     fluocinonide cream (LIDEX) 0.05 %      fluticasone (CUTIVATE) 0.05 % cream Apply topically 2 (two) times daily.     ketoconazole (NIZORAL) 2 % cream Apply 1 application  topically daily.     meloxicam (MOBIC) 15 MG tablet Take 1 tablet (15 mg total) by mouth daily. 90 tablet 1   nystatin ointment (MYCOSTATIN)      telmisartan (MICARDIS) 20 MG tablet Take 0.5 tablets (10 mg total) by mouth daily. 45 tablet 1   thyroid (ARMOUR THYROID) 60 MG tablet Take 1 tablet (60 mg total) by mouth daily before breakfast. 30 tablet 0   No current facility-administered medications on file prior to visit.   Allergies  Allergen Reactions   Adhesive [Tape]    Amoxicillin    Augmentin [Amoxicillin-Pot Clavulanate] Rash    Drug eruption   Latex Rash    Adhesives products-causes reddness, rash   Nabumetone     REACTION: Rash,Hives   Family History  Problem Relation Age of Onset   Hypertension Mother    Leukemia Mother    Diabetes Father    Uterine cancer Maternal Grandmother    Colon cancer Neg Hx    Esophageal cancer Neg Hx    Colon polyps Neg Hx    Rectal cancer Neg Hx    Stomach cancer Neg Hx    PE: BP 124/78   Pulse 83  Ht 5\' 6"  (1.676 m)   Wt 212 lb (96.2 kg)   SpO2 97%   BMI 34.22 kg/m   Wt Readings from Last 10 Encounters:  04/04/23 212 lb (96.2 kg)  03/06/23 211 lb 8 oz (95.9 kg)  11/14/22 215 lb (97.5 kg)  10/25/22 215 lb (97.5 kg)  09/25/22 215 lb (97.5 kg)  09/10/22 214 lb 3.2 oz (97.2 kg)  05/21/22 221 lb 9.6 oz (100.5 kg)  03/27/22 217 lb 12.8 oz (98.8 kg)  03/07/22 217 lb (98.4 kg)  02/27/22 217 lb 3.2 oz (98.5 kg)    Constitutional: overweight, in NAD Eyes: EOMI, no exophthalmos ENT: no thyromegaly, no cervical lymphadenopathy Cardiovascular: RRR, No MRG Respiratory: CTA B Musculoskeletal: no deformities Skin: no rashes Neurological: no tremor with outstretched hands  ASSESSMENT: 1. Hypothyroidism - on desiccated thyroid extract  2. Osteopenia  3.  Vitamin D deficiency  4. B12 def -Further management per PCP  PLAN:  1. Patient with longstanding hypothyroidism, desiccated thyroid extract, previously on Nature-Throid, now on Armour. - latest thyroid labs reviewed with pt. >> normal: Lab Results  Component Value Date   TSH 2.89 09/11/2022  - she continues on Armour 60 mg daily - pt feels good on this dose. - we discussed about taking the thyroid hormone every day, with water, >30 minutes before breakfast, separated by >4 hours from acid reflux medications, calcium, iron, multivitamins. Pt. is taking it correctly. - At today's visit, we cannot check her TFTs as she just had a steroid injection yesterday.  I will have her return in 4 to 5 weeks for labs. - I refilled her Armour for 1.5 months at today's visit  2. Osteopenia and 3.  Vitamin D deficiency -No falls or fractures since last visit -Reviewed latest bone density scan from 03/2022: T-scores are worse -At last visit, I suggested Fosamax 35 mg weekly, half maximal dose due to osteopenia rather than osteoporosis -She decided to not start Fosamax, but wait until the next bone density is due to see if she absolutely needs to start. -Since last visit, she did start exercising at the gym -Vitamin D level was normal at that time, and it was again normal 7 months ago: Lab Results  Component Value Date   VD25OH 36.30 09/11/2022   VD25OH 32.71 01/03/2022  -She is on 5500 units vitamin D daily-will continue -Since last visit, she started 1000 mg calcium daily.  We discussed that ideally she would get 1000 to 1200 mg a day but preferentially  from the diet.  We could consider decreasing the dose of calcium to 500 mg daily from supplements and the rest from the diet.  We did discuss about foods that contain higher calcium amounts. -Will check another bone density scan next year  Orders Placed This Encounter  Procedures   TSH   T4, free   Vitamin D, 25-hydroxy   T3, free   Carlus Pavlov, MD PhD [facility] Endocrinology

## 2023-04-04 NOTE — Patient Instructions (Addendum)
Please continue Armour 60 mg daily.  Take the thyroid hormones every day, with water, at least 30 minutes before breakfast, separated by at least 4 hours from: - acid reflux medications - calcium - iron - multivitamins  Please come back for labs in 4-5 weeks.  Please come back for a follow-up appointment in 1 year.

## 2023-05-12 ENCOUNTER — Other Ambulatory Visit: Payer: Medicare Other

## 2023-05-12 DIAGNOSIS — E559 Vitamin D deficiency, unspecified: Secondary | ICD-10-CM | POA: Diagnosis not present

## 2023-05-12 DIAGNOSIS — E039 Hypothyroidism, unspecified: Secondary | ICD-10-CM | POA: Diagnosis not present

## 2023-05-13 ENCOUNTER — Encounter: Payer: Self-pay | Admitting: Internal Medicine

## 2023-05-13 LAB — T4, FREE: Free T4: 1.1 ng/dL (ref 0.8–1.8)

## 2023-05-13 LAB — TSH: TSH: 2.45 m[IU]/L (ref 0.40–4.50)

## 2023-05-13 LAB — T3, FREE: T3, Free: 4 pg/mL (ref 2.3–4.2)

## 2023-05-13 LAB — VITAMIN D 25 HYDROXY (VIT D DEFICIENCY, FRACTURES): Vit D, 25-Hydroxy: 46 ng/mL (ref 30–100)

## 2023-05-21 ENCOUNTER — Other Ambulatory Visit: Payer: Self-pay

## 2023-05-21 ENCOUNTER — Ambulatory Visit
Admission: RE | Admit: 2023-05-21 | Discharge: 2023-05-21 | Disposition: A | Discharge status: Home / Self Care | Payer: Medicare Other | Source: Ambulatory Visit | Attending: Family Medicine | Admitting: Family Medicine

## 2023-05-21 ENCOUNTER — Ambulatory Visit: Payer: Self-pay | Admitting: Family Medicine

## 2023-05-21 VITALS — BP 150/88 | HR 69 | Temp 98.3°F | Resp 16

## 2023-05-21 DIAGNOSIS — N3001 Acute cystitis with hematuria: Secondary | ICD-10-CM | POA: Insufficient documentation

## 2023-05-21 LAB — POCT URINALYSIS DIP (MANUAL ENTRY)
Bilirubin, UA: NEGATIVE
Glucose, UA: NEGATIVE mg/dL
Ketones, POC UA: NEGATIVE mg/dL
Nitrite, UA: NEGATIVE
Protein Ur, POC: NEGATIVE mg/dL
Spec Grav, UA: 1.01
Urobilinogen, UA: 0.2 U/dL
pH, UA: 7

## 2023-05-21 MED ORDER — NITROFURANTOIN MONOHYD MACRO 100 MG PO CAPS: 100.0000 mg | ORAL_CAPSULE | Freq: Two times a day (BID) | ORAL | 0 refills | Status: AC

## 2023-05-21 NOTE — Telephone Encounter (Signed)
Copied from CRM [id number]. Topic: Clinical - Red Word Triage >> May 21, 2023 10:31 AM Thomes Dinning wrote: Red Word that prompted transfer to Nurse Triage: Patient believes she may have a UTI: She is experiencing pain, burning and frequent urination.  Chief Complaint: burning and frequency with urination Symptoms: burning, pain, frequency, urgency Frequency: this am started.  States hx UTI Pertinent Negatives: Patient denies fever, sob Disposition: [] ED /[x] Urgent Care (no appt availability in office) / [] Appointment(In office/virtual)/ []  [place] [facility]/ [] Home Care/ [] Refused Recommended Disposition /[] [place] [facility]/ []  Follow-up with PCP Additional Notes: States symptoms started this am.  No apt available, sent to UC for treatment.  Care advice given, denies questions, instructed to go to er if becomes worse.   Reason for Disposition  Age > 50 years  Answer Assessment - Initial Assessment Questions 1. SEVERITY: "How bad is the pain?"  (e.g., Scale 1-10; mild, moderate, or severe)   - MILD (1-3): complains slightly about urination hurting   - MODERATE (4-7): interferes with normal activities     - SEVERE (8-10): excruciating, unwilling or unable to urinate because of the pain      7/10 2. FREQUENCY: "How many times have you had painful urination today?"      At least 6 3. PATTERN: "Is pain present every time you urinate or just sometimes?"      With each 4. ONSET: "When did the painful urination start?"      This am 5. FEVER: "Do you have a fever?" If Yes, ask: "What is your temperature, how was it measured, and when did it start?"     denies 6. PAST UTI: "Have you had a urine infection before?" If Yes, ask: "When was the last time?" and "What happened that time?"      yes 7. CAUSE: "What do you think is causing the painful urination?"  (e.g., UTI, scratch, Herpes sore)     uti 8. OTHER SYMPTOMS: "Do you have any other symptoms?" (e.g., blood in urine, flank pain,  genital sores, urgency, vaginal discharge)     Urgency, frequency  Protocols used: Urination Pain - Female-A-AH

## 2023-05-21 NOTE — ED Triage Notes (Signed)
C/O dysuria, frequency, onset this morning. Patient currently not taking any medication for symptoms.

## 2023-05-21 NOTE — ED Provider Notes (Signed)
[facility]    CSN: [id number] Arrival date & time: 05/21/23  1543      History   Chief Complaint Chief Complaint  Patient presents with   Urinary Frequency    Suspected urinary tract infection. Burning pain, frequent urination,6-7 times in  last three hours. - Entered by patient   Dysuria    HPI Jacqueline Jennings is a 73 y.o. female presents for dysuria.  Patient reports today she woke with urinary burning, urgency, frequency and malodorous urine.  No hematuria, fevers, nausea/vomiting, flank pain.  No vaginal discharge or STD concern.  Does report history of UTIs.  No OTC medications have been new since onset.  No other concerns at this time.   Urinary Frequency  Dysuria   Past Medical History:  Diagnosis Date   Acute medial meniscal tear 10/02/2011   right   Allergy    seasonal   Arthritis 09/27/2011   Right knee/Cortisone injection 2'13   Breast cancer (HCC) 2006   hx of with rediation, surgery and adjuvant theapy with tamoxifen  (year two)   Cataract    removed   Chronic kidney disease    Kidney stones   Colon polyps 2014   Hyperlipidemia    Hypothyroidism 09/27/2011   Low function   IBS (irritable bowel syndrome)    Irritable bowel syndrome 05/19/2007   Qualifier: Diagnosis of  By: Lovell Sheehan MD, Balinda Quails    NEPHROLITHIASIS, HX OF 11/26/2006   Qualifier: Diagnosis of  By: Mayford Knife, LPN, Domenic Polite    OSTEOARTHROS UNSPEC WHETHER GEN/LOC [facility] SITE 02/28/2009   Qualifier: Diagnosis of  By: Lovell Sheehan MD, Balinda Quails    Sleep apnea 09/27/2011   Cpap used ,started 4'0981    Patient Active Problem List   Diagnosis Date Noted   HTN (hypertension) 02/27/2022   Malignant tumor of breast (HCC) 06/20/2020   Arthritis 06/20/2020   Rosacea 08/17/2018   Tear of medial meniscus of knee 06/15/2018   Pain in left knee 12/16/2017   B12 deficiency 03/08/2016   After cataract not obscuring vision 01/19/2013   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    Dermatochalasis 04/07/2012   Myopia with astigmatism and presbyopia 04/07/2012   Pseudophakia 04/07/2012   Bilateral dry eyes 01/17/2011   Vitamin D deficiency 06/06/2009   Obstructive sleep apnea 01/11/2009   Hypothyroidism 07/10/2007   Hyperlipemia 05/19/2007   BREAST CANCER, HX OF 05/19/2007    Past Surgical History:  Procedure Laterality Date   BREAST BIOPSY Right 1977   BREAST LUMPECTOMY Right 2006   raditon 33 tx-right   CATARACT EXTRACTION Bilateral 2008   COLONOSCOPY     CYSTOSCOPY  1977   DILATION AND CURETTAGE OF UTERUS  2011   done after thickening of uterine lining from tamoxifen   KNEE ARTHROSCOPY  10/02/2011   Procedure: ARTHROSCOPY KNEE;  Surgeon: Loanne Drilling, MD;  Location: [facility];  Service: Orthopedics;  Laterality: Right;  with Debridement, right knee medial meniscus   KNEE ARTHROSCOPY Left 05/2018   left knee arthroscopy  2020   MYOMECTOMY  1990   TONSILLECTOMY      OB History   No obstetric history on file.      Home Medications    Prior to Admission medications   Medication Sig Start Date End Date Taking? Authorizing Provider  nitrofurantoin, macrocrystal-monohydrate, (MACROBID) 100 MG capsule Take 1 capsule (100 mg total) by mouth 2 (two) times daily for 7 days. 05/21/23 05/28/23 Yes Radford Pax, NP  acetaminophen (TYLENOL) 650 MG CR tablet Take 650 mg by mouth as needed for pain.    [provider]  ARMOUR THYROID 60 MG tablet Take 1 tablet (60 mg total) by mouth daily before breakfast. 04/04/23   Carlus Pavlov, MD  Cholecalciferol (VITAMIN D-3 PO) Take 6,000 Units by mouth daily.    [provider]  COLLAGEN PO Take 12 g by mouth daily.    [provider]  fexofenadine (ALLEGRA) 180 MG tablet Take 180 mg by mouth daily.    [provider]  fluocinonide cream (LIDEX) 0.05 %     [provider]  fluticasone (CUTIVATE) 0.05 % cream Apply topically 2 (two) times daily.    [provider]   ketoconazole (NIZORAL) 2 % cream Apply 1 application  topically daily.    [provider]  meloxicam (MOBIC) 15 MG tablet Take 1 tablet (15 mg total) by mouth daily. 03/06/23   Karie Georges, MD  nystatin ointment (MYCOSTATIN)     [provider]  telmisartan (MICARDIS) 20 MG tablet Take 0.5 tablets (10 mg total) by mouth daily. 03/06/23   Karie Georges, MD    Family History Family History  Problem Relation Age of Onset   Hypertension Mother    Leukemia Mother    Diabetes Father    Uterine cancer Maternal Grandmother    Colon cancer Neg Hx    Esophageal cancer Neg Hx    Colon polyps Neg Hx    Rectal cancer Neg Hx    Stomach cancer Neg Hx     Social History Social History   Tobacco Use   Smoking status: Never   Smokeless tobacco: Never  Vaping Use   Vaping status: Never Used  Substance Use Topics   Alcohol use: Yes    Comment: rare   Drug use: No     Allergies   Adhesive [tape], Amoxicillin, Augmentin [amoxicillin-pot clavulanate], Latex, and Nabumetone   Review of Systems Review of Systems  Genitourinary:  Positive for dysuria and frequency.     Physical Exam Triage Vital Signs ED Triage Vitals  Encounter Vitals Group     BP 05/21/23 1609 (!) 150/88     Systolic BP Percentile --      Diastolic BP Percentile --      Pulse Rate 05/21/23 1609 69     Resp 05/21/23 1609 16     Temp 05/21/23 1609 98.3 F (36.8 C)     Temp Source 05/21/23 1609 Oral     SpO2 05/21/23 1609 97 %     Weight --      Height --      Head Circumference --      Peak Flow --      Pain Score 05/21/23 1610 3     Pain Loc --      Pain Education --      Exclude from Growth Chart --    No data found.  Updated Vital Signs BP (!) 150/88 (BP Location: Left Arm)   Pulse 69   Temp 98.3 F (36.8 C) (Oral)   Resp 16   SpO2 97%   Visual Acuity Right Eye Distance:   Left Eye Distance:   Bilateral Distance:    Right Eye Near:   Left Eye Near:     Bilateral Near:     Physical Exam Vitals and nursing note reviewed.  Constitutional:      Appearance: Normal appearance.  HENT:  Head: Normocephalic and atraumatic.  Eyes:     Pupils: Pupils are equal, round, and reactive to light.  Cardiovascular:     Rate and Rhythm: Normal rate.  Pulmonary:     Effort: Pulmonary effort is normal.  Abdominal:     Tenderness: There is no right CVA tenderness or left CVA tenderness.  Skin:    General: Skin is warm and dry.  Neurological:     General: No focal deficit present.     Mental Status: She is alert and oriented to person, place, and time.  Psychiatric:        Mood and Affect: Mood normal.        Behavior: Behavior normal.      UC Treatments / Results  Labs (all labs ordered are listed, but only abnormal results are displayed) Labs Reviewed  POCT URINALYSIS DIP (MANUAL ENTRY) - Abnormal; Notable for the following components:      Result Value   Color, UA light yellow (*)    Blood, UA small (*)    Leukocytes, UA Trace (*)    All other components within normal limits  URINE CULTURE   tains abnormal data CMP Order: [id number]  Status: Final result     Next appt: 05/29/2023 at 02:30 PM in [facility] Roxanna Mew, NP)     Dx: Primary hypertension   Test Result Released: Yes (seen)     Messages: Seen   0 Result Notes     1 Patient Communication          Component Ref Range & Units (hover) 8 mo ago 1 yr ago 2 yr ago 3 yr ago 4 yr ago 8 yr ago 9 yr ago  Sodium 140 140 140 139 R 141 143 141  Potassium 4.2 4.2 3.8 4.4 R 4.4 5.5 High  4.8  Chloride 104 103 103 107 R 105 108 104  CO2 28 30 29 26  R 27 30 29   Glucose, Bld 106 High  93 69 Low  127 High  R, CM 96 94 97  BUN 18 16 15 15  R 15 16 12   Creatinine, Ser 0.73 0.70 0.77 0.67 R, CM 0.75 0.88 0.6 R  Total Bilirubin 0.5 0.4 0.3 0.4 0.6    Alkaline Phosphatase 62 61 68  66    AST 18 15 22 16  R 15    ALT 16 14 20 14  R 14    Total Protein 6.7 6.7 7.0  6.2 R 6.2    Albumin 4.1 4.3 4.4  4.1    GFR 83.06 88.01 CM 78.74 CM  77.01 68.88 99.91  Comment: Calculated using the CKD-EPI Creatinine Equation (2021)  Calcium 9.6 9.6 9.4 9.3 R 9.4 9.6 9.4  Resulting Agency [place] [facility] [place] [facility] [place] [facility] [place] [facility] [place] [facility]        Specimen Collected: 09/11/22 07:36 Last Resulted: 09/11/22 11:01        EKG   Radiology No results found.  Procedures Procedures (including critical care time)  Medications Ordered in UC Medications - No data to display  Initial Impression / Assessment and Plan / UC Course  I have reviewed the triage vital signs and the nursing notes.  Pertinent labs & imaging results that were available during my care of the patient were reviewed by me and considered in my medical decision making (see chart for details).     Reviewed exam and symptoms with patient.  No red flags.  UA positive for  UTI, will culture.  Patient requested Macrobid stating this works well for her in the past.  Labs reviewed.  Encouraged rest and fluids.  PCP follow-up if symptoms do not improve.  ER precautions reviewed. Final Clinical Impressions(s) / UC Diagnoses   Final diagnoses:  Acute cystitis with hematuria     Discharge Instructions      The clinic will contact you with results of the urine culture done today if positive.  Start Macrobid twice daily for 7 days.  Lots of rest and fluids.  Please follow-up with your PCP if your symptoms do not improve.  Please go to the ER for any worsening symptoms.  I hope you feel better soon!   ED Prescriptions     Medication Sig Dispense Auth. Provider   nitrofurantoin, macrocrystal-monohydrate, (MACROBID) 100 MG capsule Take 1 capsule (100 mg total) by mouth 2 (two) times daily for 7 days. 14 capsule Radford Pax, NP      PDMP not reviewed this encounter.   Radford Pax, NP 05/21/23 [phone]

## 2023-05-21 NOTE — Discharge Instructions (Signed)
The clinic will contact you with results of the urine culture done today if positive.  Start Macrobid twice daily for 7 days.  Lots of rest and fluids.  Please follow-up with your PCP if your symptoms do not improve.  Please go to the ER for any worsening symptoms.  I hope you feel better soon!

## 2023-05-22 ENCOUNTER — Ambulatory Visit: Payer: Medicare Other | Admitting: Primary Care

## 2023-05-22 NOTE — Telephone Encounter (Signed)
Ok to close

## 2023-05-24 LAB — URINE CULTURE: Culture: 20000 — AB

## 2023-05-29 ENCOUNTER — Encounter: Payer: Self-pay | Admitting: Nurse Practitioner

## 2023-05-29 ENCOUNTER — Ambulatory Visit: Payer: Medicare Other | Admitting: Nurse Practitioner

## 2023-05-29 VITALS — BP 120/80 | HR 99 | Ht 66.0 in | Wt 214.0 lb

## 2023-05-29 DIAGNOSIS — Z09 Encounter for follow-up examination after completed treatment for conditions other than malignant neoplasm: Secondary | ICD-10-CM | POA: Diagnosis not present

## 2023-05-29 DIAGNOSIS — Z860100 Personal history of colon polyps, unspecified: Secondary | ICD-10-CM | POA: Diagnosis not present

## 2023-05-29 NOTE — Patient Instructions (Addendum)
 Follow up as needed.  Due to recent changes in healthcare laws, you may see the results of your imaging and laboratory studies on MyChart before your provider has had a chance to review them.  We understand that in some cases there may be results that are confusing or concerning to you. Not all laboratory results come back in the same time frame and the provider may be waiting for multiple results in order to interpret others.  Please give us  48 hours in order for your provider to thoroughly review all the results before contacting the office for clarification of your results.   Thank you for trusting me with your gastrointestinal care!   Elida Shawl, CRNP

## 2023-05-29 NOTE — Progress Notes (Signed)
 05/29/2023 Jacqueline Jennings [id number] [birth year]   Chief Complaint: Establish new GI  History of Present Illness: Jacqueline Jennings is a 73 year old female with a past medical history of hyperlipidemia, hypothyroidism, OSA, breast cancer s/p lumpectomy, radiation and chemo 2006, diverticulosis and colon polyps. Previously followed by Dr. Aneita. Her most recent colonoscopy was 11/14/2022 which identified one 6 mm hyperplastic polyp removed from the rectum and left-sided colon diverticulosis. No further colonoscopies were recommended due to age. She was notified that Dr. Aneita retired and she presents today to establish a new gastroenterologist in our practice. She voiced concern that she was being put out to pasture when she received the colonoscopy biopsy results which indicated no further colonoscopies were recommended due to age. I discussed with Jacqueline Jennings that a 10 year recall colonoscopy is typically recommended when a hyperplastic polyp has been removed from the colon or rectum ( this type of polyp does not carry potential to turn to a cancer), however, in 10 years she will be over the age of 45 and the national guidelines do not recommend any further colonoscopies at that juncture due to increased risk for sedation complications, bleeding and perforation.   We discussed all of her colonoscopy reports and biopsy results that I had access to in full detail. She underwent a colonoscopy 10/31/2017 which identified 2 hyperplastic polyps removed from the rectum.  Colonoscopy 09/08/2012 identified one 4 mm tubular adenomatous polyp removed from the transverse colon.  Colonoscopy 02/22/2002 showed diverticulosis without colon polyps.  I also discussed if she were to develop  any significant abdominal pain, rectal pain or rectal bleeding in the future, a diagnostic colonoscopy would be considered. She denies any known family history of colon polyps or colon cancer.  She denies having any abdominal pain  or bloody stools.  No GI complaints today.     Latest Ref Rng & Units 08/15/2021    8:55 AM 03/07/2021    3:06 PM 11/24/2019    9:29 AM  CBC  WBC 4.0 - 10.5 K/uL 5.9  9.1  6.3   Hemoglobin 12.0 - 15.0 g/dL 86.0  86.0  86.2   Hematocrit 36.0 - 46.0 % 41.8  41.4  41.1   Platelets 150.0 - 400.0 K/uL 332.0  355.0  350        Latest Ref Rng & Units 09/11/2022    7:36 AM 08/15/2021    8:55 AM 03/07/2021    3:06 PM  CMP  Glucose 70 - 99 mg/dL 893  93  69   BUN 6 - 23 mg/dL 18  16  15    Creatinine 0.40 - 1.20 mg/dL 9.26  9.29  9.22   Sodium 135 - 145 mEq/L 140  140  140   Potassium 3.5 - 5.1 mEq/L 4.2  4.2  3.8   Chloride 96 - 112 mEq/L 104  103  103   CO2 19 - 32 mEq/L 28  30  29    Calcium 8.4 - 10.5 mg/dL 9.6  9.6  9.4   Total Protein 6.0 - 8.3 g/dL 6.7  6.7  7.0   Total Bilirubin 0.2 - 1.2 mg/dL 0.5  0.4  0.3   Alkaline Phos 39 - 117 U/L 62  61  68   AST 0 - 37 U/L 18  15  22    ALT 0 - 35 U/L 16  14  20      PAST GI PROCEDURES:.  Colonoscopy 11/14/2022 by Dr. Aneita: - One 6  mm polyp in the distal rectum, removed with a cold snare. Resected and retrieved.  - Diverticulosis in the left colon.  - The examination was otherwise normal on direct and retroflexion views Surgical [P], colon, rectum, polyp (1) HYPERPLASTIC POLYP WITH CHANGES OF MUCOSAL PROLAPSE NEGATIVE FOR DYSPLASIA AND CARCINOMA  Colonoscopy 10/31/2017: - Two 5 to 6 mm polyps in the rectum, removed with a cold snare. Resected and retrieved.  - Moderate diverticulosis in the left colon. - HYPERPLASTIC POLYP(S). - THERE IS NO EVIDENCE OF MALIGNANCY  Colonoscopy 09/08/2012: Sessile polyp measuring 4 mm in the transverse colon Moderate diverticulosis in the sigmoid and descending colon - TUBULAR ADENOMA. NO HIGH GRADE DYSPLASIA OR MALIGNANCY IDENTIFIED.  ? Colonoscopy 04/25/2007: Report not accessible  Colonoscopy 02/22/2002: Diverticulosis No polyps  Past Medical History:  Diagnosis Date   Acute medial meniscal  tear 10/02/2011   right   Allergy    seasonal   Arthritis 09/27/2011   Right knee/Cortisone injection 2'13   Breast cancer (HCC) 2006   hx of with rediation, surgery and adjuvant theapy with tamoxifen  (year two)   Cataract    removed   Chronic kidney disease    Kidney stones   Colon polyps 2014   Hyperlipidemia    Hypothyroidism 09/27/2011   Low function   IBS (irritable bowel syndrome)    Irritable bowel syndrome 05/19/2007   Qualifier: Diagnosis of  By: Mavis MD, Norleen BRAVO    NEPHROLITHIASIS, HX OF 11/26/2006   Qualifier: Diagnosis of  By: Trudy, LPN, Kendell HERO    OSTEOARTHROS UNSPEC WHETHER GEN/LOC [facility] SITE 02/28/2009   Qualifier: Diagnosis of  By: Mavis MD, Norleen BRAVO    Sleep apnea 09/27/2011   Cpap used ,started 7'7988   Past Surgical History:  Procedure Laterality Date   BREAST BIOPSY Right 1977   BREAST LUMPECTOMY Right 2006   raditon 33 tx-right   CATARACT EXTRACTION Bilateral 2008   COLONOSCOPY     CYSTOSCOPY  1977   DILATION AND CURETTAGE OF UTERUS  2011   done after thickening of uterine lining from tamoxifen   KNEE ARTHROSCOPY  10/02/2011   Procedure: ARTHROSCOPY KNEE;  Surgeon: Dempsey LULLA Moan, MD;  Location: [facility];  Service: Orthopedics;  Laterality: Right;  with Debridement, right knee medial meniscus   KNEE ARTHROSCOPY Left 05/2018   left knee arthroscopy  2020   MYOMECTOMY  1990   TONSILLECTOMY       Current Outpatient Medications on File Prior to Visit  Medication Sig Dispense Refill   acetaminophen  (TYLENOL ) 650 MG CR tablet Take 650 mg by mouth as needed for pain.     ARMOUR THYROID  60 MG tablet Take 1 tablet (60 mg total) by mouth daily before breakfast. 45 tablet 1   Cholecalciferol (VITAMIN D -3 PO) Take 6,000 Units by mouth daily.     COLLAGEN PO Take 12 g by mouth daily.     fexofenadine (ALLEGRA) 180 MG tablet Take 180 mg by mouth daily.     fluocinonide cream (LIDEX) 0.05 %      fluticasone  (CUTIVATE ) 0.05 % cream Apply topically 2  (two) times daily.     ketoconazole (NIZORAL) 2 % cream Apply 1 application  topically daily.     meloxicam  (MOBIC ) 15 MG tablet Take 1 tablet (15 mg total) by mouth daily. 90 tablet 1   nystatin ointment (MYCOSTATIN)      telmisartan  (MICARDIS ) 20 MG tablet Take 0.5 tablets (10 mg total) by mouth daily. 45 tablet  1   No current facility-administered medications on file prior to visit.   Allergies  Allergen Reactions   Adhesive [Tape]    Amoxicillin     Augmentin  [Amoxicillin -Pot Clavulanate] Rash    Drug eruption   Latex Rash    Adhesives products-causes reddness, rash   Nabumetone     REACTION: Rash,Hives   Current Medications, Allergies, Past Medical History, Past Surgical History, Family History and Social History were reviewed in [employer] record.  Review of Systems:   Constitutional: Negative for fever, sweats, chills or weight loss.  Respiratory: Negative for shortness of breath.   Cardiovascular: Negative for chest pain, palpitations and leg swelling.  Gastrointestinal: See HPI.  Musculoskeletal: Negative for back pain or muscle aches.  Neurological: Negative for dizziness, headaches or paresthesias.   Physical Exam: BP 120/80   Pulse 99   Ht 5' 6 (1.676 m)   Wt 214 lb (97.1 kg)   BMI 34.54 kg/m  General: in no acute distress. Physical exam was not completed today as patient did not have any GI symptoms.  Appointment today was discussion based.  See HPI.  Assessment and Recommendations:  74 year old female with a history of colon polyps. Colonoscopy 08/2012 identified 1 small tubular adenomatous polyp removed from the colon.  Colonoscopy 10/2017 identified 2 hyperplastic polyps removed from the colon.  Her most recent colonoscopy 11/14/2022 identified one 6 mm hyperplastic polyp removed from the rectum with evidence of mucosal prolapse.  No known family history of colon polyps or colorectal cancer. -No further colon polyp surveillance  colonoscopies recommended due to age. Dr. Legrand to further review, I will contact the patient if he has any other recommendations. -A diagnostic colonoscopy would be considered in the future if she developed significant abdominal/rectal pain, rectal bleeding or anemia  Personal history of breast cancer 2006

## 2023-06-01 DIAGNOSIS — R300 Dysuria: Secondary | ICD-10-CM | POA: Diagnosis not present

## 2023-06-02 NOTE — Progress Notes (Signed)
 ____________________________________________________________  Attending physician addendum:  Thank you for sending this case to me. I have reviewed the entire note and agree with the plan.  Thank you for addressing her concerns and explaining the current evidence-based guidelines regarding colon cancer screening and polyp surveillance and consideration of the procedure risks and benefits, her age and relatively low risk personal polyp history. I agree that a screening colonoscopy in her early 79s is not recommended.  Diagnostic evaluations for any symptoms that may arise would certainly be reasonable as you point out.  Victory Brand, MD  ____________________________________________________________

## 2023-06-02 NOTE — Progress Notes (Signed)
 I called the patient and discussed Dr. Dominic Friendly' recommendations that no screening colonoscopies are recommended in her early 18s. She appreciated the call.

## 2023-06-06 ENCOUNTER — Encounter: Payer: Self-pay | Admitting: Family Medicine

## 2023-06-06 ENCOUNTER — Ambulatory Visit (INDEPENDENT_AMBULATORY_CARE_PROVIDER_SITE_OTHER): Payer: Medicare Other | Admitting: Family Medicine

## 2023-06-06 VITALS — BP 128/78 | HR 68 | Temp 97.7°F | Ht 66.0 in | Wt 214.8 lb

## 2023-06-06 DIAGNOSIS — R399 Unspecified symptoms and signs involving the genitourinary system: Secondary | ICD-10-CM | POA: Diagnosis not present

## 2023-06-06 LAB — POC URINALSYSI DIPSTICK (AUTOMATED)
Bilirubin, UA: NEGATIVE
Blood, UA: NEGATIVE
Glucose, UA: NEGATIVE
Ketones, UA: NEGATIVE
Leukocytes, UA: NEGATIVE
Nitrite, UA: NEGATIVE
Protein, UA: NEGATIVE
Spec Grav, UA: 1.015
Urobilinogen, UA: 0.2 U/dL
pH, UA: 6.5

## 2023-06-06 NOTE — Progress Notes (Signed)
Acute Office Visit   Subjective:  Patient ID: Jacqueline Jennings, female    DOB: Jan 04, 1951, 73 y.o.   MRN: [id number]  Chief Complaint  Patient presents with   Follow-up    Recent urgent care visit for UTI.  Burning sensation without urinating while sitting-yesterday only      HPI Patient is present for an acute visit. She is following up an urgent care visit at [facility] on 06/01/2023. She initial was treated for a UTI with Macrobid at the end of January, but developed burning and frequency when urinating a day before this urgent care visit on the 9th. On 02/11, she received a phone call to stop taking Cipro since the urine culture came back negative. She last took Cipro Tuesday.   Patient denies having symptoms today. She reports yesterday she had some burning without urinating while sitting. Also, had increase urinary frequency, but could be related to drinking a lot of water (usually tries to get 100 oz a day).   ROS See HPI above      Objective:   BP 128/78 (BP Location: Left Arm, Patient Position: Sitting, Cuff Size: Normal)   Pulse 68   Temp 97.7 F (36.5 C) (Oral)   Ht 5\' 6"  (1.676 m)   Wt 214 lb 12.8 oz (97.4 kg)   SpO2 98%   BMI 34.67 kg/m    Physical Exam Vitals reviewed.  Constitutional:      General: She is not in acute distress.    Appearance: Normal appearance. She is not ill-appearing, toxic-appearing or diaphoretic.  HENT:     Head: Normocephalic and atraumatic.  Eyes:     General:        Right eye: No discharge.        Left eye: No discharge.     Conjunctiva/sclera: Conjunctivae normal.  Cardiovascular:     Rate and Rhythm: Normal rate and regular rhythm.     Heart sounds: Normal heart sounds. No murmur heard.    No friction rub. No gallop.  Pulmonary:     Effort: Pulmonary effort is normal. No respiratory distress.     Breath sounds: Normal breath sounds.  Abdominal:     General: There is no  distension.     Tenderness: There is no right CVA tenderness or left CVA tenderness.  Musculoskeletal:        General: Normal range of motion.  Skin:    General: Skin is warm and dry.  Neurological:     General: No focal deficit present.     Mental Status: She is alert and oriented to person, place, and time. Mental status is at baseline.  Psychiatric:        Mood and Affect: Mood normal.        Behavior: Behavior normal.        Thought Content: Thought content normal.        Judgment: Judgment normal.     Latest Reference Range & Units 06/06/23 09:57  Bilirubin, UA  Negative  Clarity, UA  Clear  Color, UA  Yellow  Glucose Negative  Negative  Ketones, UA  Negative  Leukocytes,UA Negative  Negative  Nitrite, UA  Negative  pH, UA 5.0 - 8.0  6.5  Protein,UA Negative  Negative  Specific Gravity, UA 1.010 - 1.025  1.015  Urobilinogen, UA 0.2 or 1.0 E.U./dL 0.2  RBC, UA  Negative       Assessment & Plan:  UTI symptoms -     POCT Urinalysis Dipstick (Automated) -     Urine Culture  -Urinalysis completed today with normal results. Will send urine to be cultured for confirmation. At this time, no antibiotic is needed. Will inform patient of culture results once available. -If symptoms return over the weekend, advised to please follow up at an urgent care.  -Continue to hydrate.  -Discussed about certain foods irritating the bladder. Recommend to keep dairy or recollection of foods that can cause complications.   Zandra Abts, NP

## 2023-06-06 NOTE — Patient Instructions (Signed)
-  Urinalysis completed today with normal results. Will send urine to be cultured for confirmation. At this time, no antibiotic is needed. Will inform you of culture results once available. -If symptoms return over the weekend, please follow up at an urgent care.  -Continue to hydrate.  -Discussed about certain foods irritating the bladder. Recommend to keep dairy or recollection of foods that can cause complications.  -Have a great weekend!

## 2023-06-08 LAB — URINE CULTURE
MICRO NUMBER:: 16086571
Result:: NO GROWTH
SPECIMEN QUALITY:: ADEQUATE

## 2023-06-09 ENCOUNTER — Encounter: Payer: Self-pay | Admitting: Family Medicine

## 2023-06-18 ENCOUNTER — Encounter: Payer: Self-pay | Admitting: Primary Care

## 2023-06-18 ENCOUNTER — Ambulatory Visit (INDEPENDENT_AMBULATORY_CARE_PROVIDER_SITE_OTHER): Payer: Medicare Other | Admitting: Primary Care

## 2023-06-18 VITALS — BP 120/68 | HR 77 | Temp 96.9°F | Ht 66.0 in | Wt 214.0 lb

## 2023-06-18 DIAGNOSIS — G4733 Obstructive sleep apnea (adult) (pediatric): Secondary | ICD-10-CM | POA: Diagnosis not present

## 2023-06-18 NOTE — Patient Instructions (Signed)
 -  OBSTRUCTIVE SLEEP APNEA: Obstructive sleep apnea is a condition where your breathing repeatedly stops and starts during sleep. You are doing well with your current treatment. Continue using your CPAP machine with the same settings. We will renew your CPAP supplies. If you plan to travel, consider using a travel CPAP machine. We can provide a prescription for this if needed.  INSTRUCTIONS:  Continue using your CPAP machine as you have been. If you plan to travel and are having trouble sleeping with your oral appliance, consider using a travel CPAP machine. We can provide a prescription for this if needed. Continue managing your other health conditions as directed by your primary care provider or respective specialists.  Follow-up 1 year follow-up with Beth NP or sooner if needed

## 2023-06-18 NOTE — Progress Notes (Signed)
 @Patient  ID: Jacqueline Jennings, female    DOB: 06-23-1950, 73 y.o.   MRN: [id number]  Chief Complaint  Patient presents with   Follow-up    Referring provider: Karie Georges, MD  HPI: 73 year old female, never smoker. PMH significant for HTN, OSA, hypothyroidism, arthritis, hyperlipidemia, hx breast cancer, vit D deficiency. Patient of Dr. Craige Cotta.   Previous [facility] pulmonary encounter: 11/130/21- Dr. Craige Cotta She uses CPAP nightly.  Has nasal pillow mask.  Not having sinus congestion, sore throat, dry mouth.  Has oral appliance that she uses intermittently when traveling.  Her CPAP is more than 72 yrs old.  Starting to make funny noises.  05/21/2022 Patient presents today for overdue follow-up OSA/CPAP. She received new CPAP 2 years ago  She is wearing CPAP nightly No issues with mask fit or pressure settings  She uses nasal pillow  CPAP is on auto settings  Sleeping well at night. No snoring.  No significant daytime sleepiness  She uses oral appliance for long travel   Airview download 04/21/2022-05/20/2022 Usage days 30/30 days (100%) greater than 4 hours Average usage 8 hours 28 minutes Pressure 5 to 20 cm H2O (14.5 m H2O-95%) Air leaks 22.1 L/min (95%) AHI 0.1  06/18/2023- Interim hx  Discussed the use of AI scribe software for clinical note transcription with the patient, who gave verbal consent to proceed.  History of Present Illness   Jacqueline Jennings is a 73 year old female with obstructive sleep apnea who presents for a one-year follow-up.  She has been using a CPAP machine with auto pressure settings ranging from 5 to 20 cm H2O. Her average usage is 8 to 8.5 hours per night, and she is 100% compliant with the therapy over the last 30 days. Her apnea-hypopnea index (AHI) is 0.1/hour, indicating effective control of her sleep apnea.  Occasionally, she experiences minimal air leaks, averaging 1 liter per minute, which she attributes to the nasal pillows shifting during  sleep. This does not significantly interrupt her sleep.  She uses a dental appliance occasionally when traveling, although she primarily uses her CPAP machine at home. She lives alone and does not have a bed partner to report on her snoring or sleep disturbances.      Airview download 05/18/2023 - 06/16/2023 Usage days 30/30 days (100%) Average usage 8 hours 30 minutes Pressure 5 to 20 cm H2O (14.2 cm H2O-95%) Air leaks 26.4 L/min (95%) AHI 0.1  Sleep testing: PSG 02/01/09 >> AHI 11 Auto CPAP 02/20/20 to 03/20/20 >> used on 30 of 30 nights with average 8 hrs 35 min.  Average AHI 0.2 with median CPAP 9 and 95 th percentile CPAP 12 cm H2O   Allergies  Allergen Reactions   Adhesive [Tape]    Amoxicillin    Augmentin [Amoxicillin-Pot Clavulanate] Rash    Drug eruption   Latex Rash    Adhesives products-causes reddness, rash   Nabumetone     REACTION: Rash,Hives    Immunization History  Administered Date(s) Administered   Fluad Quad(high Dose 65+) 02/22/2019, 02/23/2020, 03/07/2021, 02/27/2022   Fluad Trivalent(High Dose 65+) 03/06/2023   Influenza Split 01/14/2011, 02/20/2012   Influenza Whole 01/21/2007, 01/18/2008, 01/17/2009   Influenza, High Dose Seasonal PF 02/14/2017, 02/17/2018   Influenza,inj,Quad PF,6+ Mos 02/08/2013, 02/17/2014, 12/14/2014, 12/19/2015   Influenza,inj,quad, With Preservative 01/28/2018   Influenza-Unspecified 02/27/2022   PFIZER(Purple Top)SARS-COV-2 Vaccination 05/29/2019, 06/23/2019, 02/05/2020   Pneumococcal Conjugate-13 02/14/2017   Pneumococcal Polysaccharide-23 02/17/2018   Td 04/22/2005  Tdap 02/22/2016   Unspecified SARS-COV-2 Vaccination 05/29/2019, 06/23/2019, 02/05/2020   Zoster Recombinant(Shingrix) 07/21/2020, 08/20/2020   Zoster, Live 02/20/2012, 07/20/2020, 09/19/2020    Past Medical History:  Diagnosis Date   Acute medial meniscal tear 10/02/2011   right   Allergy    seasonal   Arthritis 09/27/2011   Right knee/Cortisone  injection 2'13   Breast cancer (HCC) 2006   hx of with rediation, surgery and adjuvant theapy with tamoxifen  (year two)   Cataract    removed   Chronic kidney disease    Kidney stones   Colon polyps 2014   Hyperlipidemia    Hypothyroidism 09/27/2011   Low function   IBS (irritable bowel syndrome)    Irritable bowel syndrome 05/19/2007   Qualifier: Diagnosis of  By: Lovell Sheehan MD, Balinda Quails    NEPHROLITHIASIS, HX OF 11/26/2006   Qualifier: Diagnosis of  By: Mayford Knife, LPN, Domenic Polite    OSTEOARTHROS UNSPEC WHETHER GEN/LOC [facility] SITE 02/28/2009   Qualifier: Diagnosis of  By: Lovell Sheehan MD, Balinda Quails    Sleep apnea 09/27/2011   Cpap used ,started 6'4332    Tobacco History: Social History   Tobacco Use  Smoking Status Never  Smokeless Tobacco Never   Counseling given: Not Answered   Outpatient Medications Prior to Visit  Medication Sig Dispense Refill   acetaminophen (TYLENOL) 650 MG CR tablet Take 650 mg by mouth as needed for pain.     ARMOUR THYROID 60 MG tablet Take 1 tablet (60 mg total) by mouth daily before breakfast. 45 tablet 1   Cholecalciferol (VITAMIN D-3 PO) Take 6,000 Units by mouth daily.     COLLAGEN PO Take 12 g by mouth daily.     fexofenadine (ALLEGRA) 180 MG tablet Take 180 mg by mouth daily.     fluocinonide cream (LIDEX) 0.05 %      fluticasone (CUTIVATE) 0.05 % cream Apply topically 2 (two) times daily.     ketoconazole (NIZORAL) 2 % cream Apply 1 application  topically daily.     meloxicam (MOBIC) 15 MG tablet Take 1 tablet (15 mg total) by mouth daily. 90 tablet 1   nystatin ointment (MYCOSTATIN)      telmisartan (MICARDIS) 20 MG tablet Take 0.5 tablets (10 mg total) by mouth daily. 45 tablet 1   No facility-administered medications prior to visit.   Review of Systems  Review of Systems  Constitutional: Negative.   HENT: Negative.    Respiratory: Negative.    Cardiovascular: Negative.    Physical Exam  BP 120/68 (BP Location: Left Arm, Patient  Position: Sitting, Cuff Size: Large)   Pulse 77   Temp (!) 96.9 F (36.1 C) (Temporal)   Ht 5\' 6"  (1.676 m)   Wt 214 lb (97.1 kg)   SpO2 95%   BMI 34.54 kg/m  Physical Exam Constitutional:      Appearance: Normal appearance.  HENT:     Head: Normocephalic and atraumatic.     Mouth/Throat:     Mouth: Mucous membranes are moist.     Pharynx: Oropharynx is clear.  Cardiovascular:     Rate and Rhythm: Normal rate and regular rhythm.  Pulmonary:     Effort: Pulmonary effort is normal.     Breath sounds: Normal breath sounds.  Musculoskeletal:        General: Normal range of motion.  Skin:    General: Skin is warm and dry.  Neurological:     General: No focal deficit present.  Mental Status: She is alert and oriented to person, place, and time. Mental status is at baseline.  Psychiatric:        Mood and Affect: Mood normal.        Behavior: Behavior normal.        Thought Content: Thought content normal.        Judgment: Judgment normal.      Lab Results:  CBC    Component Value Date/Time   WBC 5.9 08/15/2021 0855   RBC 4.78 08/15/2021 0855   HGB 13.9 08/15/2021 0855   HGB 12.7 08/09/2010 1343   HCT 41.8 08/15/2021 0855   HCT 39.3 08/09/2010 1343   PLT 332.0 08/15/2021 0855   PLT 284 08/09/2010 1343   MCV 87.5 08/15/2021 0855   MCV 86.2 08/09/2010 1343   MCH 28.7 11/24/2019 0929   MCHC 33.3 08/15/2021 0855   RDW 14.3 08/15/2021 0855   RDW 14.1 08/09/2010 1343   LYMPHSABS 1.5 08/15/2021 0855   LYMPHSABS 2.1 08/09/2010 1343   MONOABS 0.5 08/15/2021 0855   MONOABS 0.4 08/09/2010 1343   EOSABS 0.1 08/15/2021 0855   EOSABS 0.2 08/09/2010 1343   BASOSABS 0.0 08/15/2021 0855   BASOSABS 0.0 08/09/2010 1343    BMET    Component Value Date/Time   NA 140 09/11/2022 0736   K 4.2 09/11/2022 0736   CL 104 09/11/2022 0736   CO2 28 09/11/2022 0736   GLUCOSE 106 (H) 09/11/2022 0736   BUN 18 09/11/2022 0736   CREATININE 0.73 09/11/2022 0736   CREATININE 0.67  11/24/2019 0929   CALCIUM 9.6 09/11/2022 0736   CALCIUM 10.3 01/14/2011 1236   GFRNONAA 90 11/24/2019 0929   GFRAA 105 11/24/2019 0929    BNP No results found for: "BNP"  ProBNP No results found for: "PROBNP"  Imaging: No results found.   Assessment & Plan:   1. Obstructive sleep apnea (Primary) - For home use only DME continuous positive airway pressure (CPAP)     Obstructive Sleep Apnea Excellent compliance with CPAP use, with an average usage of 8.5 hours per night. Current pressure 5-20cm h20 (14cm h20-95%); residual AHI 0.1/hour, indicating effective treatment. Occasional air leak noted, but not affecting overall score or sleep quality. -Continue current CPAP settings  -Renew CPAP supplies through [employer]. - Patient is due for replacement CPAP machine in August 2026.  Provide patient with prescription for travel CPAP machine auto settings 5-15cm h20 for future travels, pending patient's self-assessment of sleep quality with oral appliance.    Glenford Bayley, NP 06/18/2023

## 2023-07-20 ENCOUNTER — Other Ambulatory Visit: Payer: Self-pay | Admitting: Internal Medicine

## 2023-07-25 ENCOUNTER — Ambulatory Visit: Payer: Self-pay

## 2023-07-25 ENCOUNTER — Ambulatory Visit: Admitting: Family

## 2023-07-25 VITALS — BP 122/78 | HR 75 | Temp 98.3°F | Ht 66.0 in | Wt 213.0 lb

## 2023-07-25 DIAGNOSIS — A499 Bacterial infection, unspecified: Secondary | ICD-10-CM

## 2023-07-25 DIAGNOSIS — N390 Urinary tract infection, site not specified: Secondary | ICD-10-CM | POA: Diagnosis not present

## 2023-07-25 DIAGNOSIS — R300 Dysuria: Secondary | ICD-10-CM | POA: Diagnosis not present

## 2023-07-25 LAB — POC URINALSYSI DIPSTICK (AUTOMATED)
Bilirubin, UA: NEGATIVE
Blood, UA: POSITIVE
Glucose, UA: NEGATIVE
Ketones, UA: NEGATIVE
Nitrite, UA: NEGATIVE
Protein, UA: NEGATIVE
Spec Grav, UA: 1.015 (ref 1.010–1.025)
Urobilinogen, UA: 0.2 U/dL
pH, UA: 6 (ref 5.0–8.0)

## 2023-07-25 MED ORDER — NITROFURANTOIN MONOHYD MACRO 100 MG PO CAPS
100.0000 mg | ORAL_CAPSULE | Freq: Two times a day (BID) | ORAL | 0 refills | Status: AC
Start: 2023-07-25 — End: ?

## 2023-07-25 NOTE — Progress Notes (Signed)
 Acute Office Visit  Subjective:     Patient ID: Jacqueline Jennings, female    DOB: December 08, 1950, 73 y.o.   MRN: [id number]  Chief Complaint  Patient presents with  . Dysuria    Yesterday, pt did Azo test at home show up positive for leukocyte. On Tuesday pt felt something in lower back, does not feel today.     HPI Patient is in today with complaints of urinary frequency, urgency, burning with urination x 2 days.  She took a home Azo urinary test that showed positive for nitrites and leukocytes.  She has attempted to increase her water intake that has helped.  Had a UTI last month as well.  Review of Systems  Constitutional:  Negative for chills and fever.  Gastrointestinal: Negative.   Genitourinary:  Positive for dysuria, frequency and urgency.  Musculoskeletal: Negative.   Psychiatric/Behavioral: Negative.    All other systems reviewed and are negative.  Past Medical History:  Diagnosis Date  . Acute medial meniscal tear 10/02/2011   right  . Allergy    seasonal  . Arthritis 09/27/2011   Right knee/Cortisone injection 2'13  . Breast cancer (HCC) 2006   hx of with rediation, surgery and adjuvant theapy with tamoxifen  (year two)  . Cataract    removed  . Chronic kidney disease    Kidney stones  . Colon polyps 2014  . Hyperlipidemia   . Hypothyroidism 09/27/2011   Low function  . IBS (irritable bowel syndrome)   . Irritable bowel syndrome 05/19/2007   Qualifier: Diagnosis of  By: Lovell Sheehan MD, Balinda Quails NEPHROLITHIASIS, HX OF 11/26/2006   Qualifier: Diagnosis of  By: Mayford Knife LPN, Heide Guile UNSPEC WHETHER GEN/LOC [facility] SITE 02/28/2009   Qualifier: Diagnosis of  By: Lovell Sheehan MD, Balinda Quails   . Sleep apnea 09/27/2011   Cpap used ,started 2'2011    Social History   Socioeconomic History  . Marital status: Widowed    Spouse name: Not on file  . Number of children: 0  . Years of education: 16  . Highest education level: Bachelor's degree (e.g., BA,  AB, BS)  Occupational History  . Occupation: retired  Tobacco Use  . Smoking status: Never  . Smokeless tobacco: Never  Vaping Use  . Vaping status: Never Used  Substance and Sexual Activity  . Alcohol use: Yes    Comment: rare  . Drug use: No  . Sexual activity: Yes  Other Topics Concern  . 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 Difficulty of Paying Living Expenses: Not hard at all  Food Insecurity: No Food Insecurity (06/05/2023)   Hunger Vital Sign   . Worried About Programme researcher, broadcasting/film/video in the Last Year: Never true   . Ran Out of Food in the Last Year: Never true  Transportation Needs: No Transportation Needs (06/05/2023)   PRAPARE - Transportation   . Lack of Transportation (Medical): No   . Lack of Transportation (Non-Medical): No  Physical Activity: Sufficiently Active (06/05/2023)   Exercise Vital Sign   . Days of Exercise per Week: 5 days   .  Minutes of Exercise per Session: 30 min  Stress: No Stress Concern Present (06/05/2023)   [employer] of Occupational Health - Occupational Stress Questionnaire   . Feeling of Stress : Not at all  Social Connections: Moderately Integrated (06/05/2023)   Social Connection and Isolation Panel [NHANES]   . Frequency of Communication with Friends and Family: More than three times a week   . Frequency of Social Gatherings with Friends and Family: Three times a week   . Attends Religious Services: More than 4 times per year   . Active Member of Clubs or Organizations: Yes   . Attends Banker Meetings: More than 4 times per year   . Marital Status: Widowed  Intimate Partner Violence: Not At Risk (03/07/2022)   Humiliation, Afraid, Rape, and Kick questionnaire   . Fear of  Current or Ex-Partner: No   . Emotionally Abused: No   . Physically Abused: No   . Sexually Abused: No    Past Surgical History:  Procedure Laterality Date  . BREAST BIOPSY Right 1977  . BREAST LUMPECTOMY Right 2006   raditon 33 tx-right  . CATARACT EXTRACTION Bilateral 2008  . COLONOSCOPY    . CYSTOSCOPY  1977  . DILATION AND CURETTAGE OF UTERUS  2011   done after thickening of uterine lining from tamoxifen  . KNEE ARTHROSCOPY  10/02/2011   Procedure: ARTHROSCOPY KNEE;  Surgeon: Loanne Drilling, MD;  Location: [facility];  Service: Orthopedics;  Laterality: Right;  with Debridement, right knee medial meniscus  . KNEE ARTHROSCOPY Left 05/2018  . left knee arthroscopy  2020  . MYOMECTOMY  1990  . TONSILLECTOMY      Family History  Problem Relation Age of Onset  . Hypertension Mother   . Leukemia Mother   . Diabetes Father   . Uterine cancer Maternal Grandmother   . Colon cancer Neg Hx   . Esophageal cancer Neg Hx   . Colon polyps Neg Hx   . Rectal cancer Neg Hx   . Stomach cancer Neg Hx     Allergies  Allergen Reactions  . Adhesive [Tape]   . Amoxicillin   . Augmentin [Amoxicillin-Pot Clavulanate] Rash    Drug eruption  . Latex Rash    Adhesives products-causes reddness, rash  . Nabumetone     REACTION: Rash,Hives    Current Outpatient Medications on File Prior to Visit  Medication Sig Dispense Refill  . acetaminophen (TYLENOL) 650 MG CR tablet Take 650 mg by mouth as needed for pain.    Mack Guise THYROID 60 MG tablet TAKE 1 TABLET BY MOUTH DAILY BEFORE BREAKFAST 45 tablet 2  . Cholecalciferol (VITAMIN D-3 PO) Take 6,000 Units by mouth daily.    . COLLAGEN PO Take 12 g by mouth daily.    . fexofenadine (ALLEGRA) 180 MG tablet Take 180 mg by mouth daily.    . fluocinonide cream (LIDEX) 0.05 %     . fluticasone (CUTIVATE) 0.05 % cream Apply topically 2 (two) times daily.    Marland Kitchen ketoconazole (NIZORAL) 2 % cream Apply 1 application  topically daily.    . meloxicam  (MOBIC) 15 MG tablet Take 1 tablet (15 mg total) by mouth daily. 90 tablet 1  . nystatin ointment (MYCOSTATIN)     . telmisartan (MICARDIS) 20 MG tablet Take 0.5 tablets (10 mg total) by mouth daily. 45 tablet 1   No current facility-administered medications on file prior to visit.    BP 122/78 (BP Location: Left  Arm, Patient Position: Sitting)   Pulse 75   Temp 98.3 F (36.8 C) (Temporal)   Ht 5\' 6"  (1.676 m)   Wt 213 lb (96.6 kg)   SpO2 97%   BMI 34.38 kg/m chart     Objective:    BP 122/78 (BP Location: Left Arm, Patient Position: Sitting)   Pulse 75   Temp 98.3 F (36.8 C) (Temporal)   Ht 5\' 6"  (1.676 m)   Wt 213 lb (96.6 kg)   SpO2 97%   BMI 34.38 kg/m    Physical Exam Vitals reviewed.  Constitutional:      Appearance: Normal appearance. She is obese.  Cardiovascular:     Rate and Rhythm: Normal rate and regular rhythm.  Pulmonary:     Effort: Pulmonary effort is normal.     Breath sounds: Normal breath sounds.  Abdominal:     General: Bowel sounds are normal.     Palpations: Abdomen is soft.  Skin:    General: Skin is warm and dry.  Neurological:     General: No focal deficit present.     Mental Status: She is alert and oriented to person, place, and time. Mental status is at baseline.  Psychiatric:        Mood and Affect: Mood normal.        Behavior: Behavior normal.        Thought Content: Thought content normal.   Results for orders placed or performed in visit on 07/25/23  POCT Urinalysis Dipstick (Automated)  Result Value Ref Range   Color, UA yellow    Clarity, UA clear    Glucose, UA Negative Negative   Bilirubin, UA negative    Ketones, UA negative    Spec Grav, UA 1.015 1.010 - 1.025   Blood, UA positive    pH, UA 6.0 5.0 - 8.0   Protein, UA Negative Negative   Urobilinogen, UA 0.2 0.2 or 1.0 E.U./dL   Nitrite, UA negative    Leukocytes, UA Moderate (2+) (A) Negative        Assessment & Plan:   Problem List Items Addressed This  Visit   None Visit Diagnoses       Dysuria    -  Primary   Relevant Orders   POCT Urinalysis Dipstick (Automated) (Completed)   Urine Culture     Bacterial urinary infection       Relevant Medications   nitrofurantoin, macrocrystal-monohydrate, (MACROBID) 100 MG capsule       Meds ordered this encounter  Medications  . nitrofurantoin, macrocrystal-monohydrate, (MACROBID) 100 MG capsule    Sig: Take 1 capsule (100 mg total) by mouth 2 (two) times daily.    Dispense:  14 capsule    Refill:  0   Call the office if symptoms worsen or persist.  Recheck as scheduled and sooner as needed.  If UTIs continue to be an issue, please speak with your PCP about frequency. No follow-ups on file.  Eulis Foster, FNP

## 2023-07-25 NOTE — Telephone Encounter (Signed)
 Copied from CRM [id number]. Topic: Clinical - Red Word Triage >> Jul 25, 2023 10:56 AM Marya Fossa L wrote: [facility] that prompted transfer to Nurse Triage: UTI   Chief Complaint: Urinary Symptoms Symptoms: burning with urination, frequency, urgency Frequency: yesterday Pertinent Negatives: Patient denies blood in urine, fever, nausea, vomiting Disposition: [] ED /[] Urgent Care (no appt availability in office) / [x] Appointment(In office/virtual)/ []  [facility]/ [] Home Care/ [] Refused Recommended Disposition /[] [facility]/ []  Follow-up with PCP Additional Notes: Patient called and advised that she is having burning and pain with urination.  She denies seeing any blood at this time.  Patient took an at home UTI test by Azo. Patient states that she has been having burning/pain with urination, urinary frequency, and urinary urgency since yesterday.  She has a history of having UTIs in the past. She denies any blood in her urine, nausea, vomiting, or having a fever at this time. Appointment made at the [facility] office with Worthy Rancher, FNP at 2:15pm today 07/25/2023. Patient is familiar with this location and given the address as well as instructed to arrive about 15 minutes early to check in.  Patient is also given Care Advice as per protocol. Patient is advised that if anything worsens to go to Urgent Care or to an Emergency Room.  Patient verbalized understanding.  Reason for Disposition  Age > 50 years  Answer Assessment - Initial Assessment Questions 1. SEVERITY: "How bad is the pain?"  (e.g., Scale 1-10; mild, moderate, or severe)   - MILD (1-3): complains slightly about urination hurting   - MODERATE (4-7): interferes with normal activities     - SEVERE (8-10): excruciating, unwilling or unable to urinate because of the pain      7 2. FREQUENCY: "How many times have you had painful urination today?"      4 3. PATTERN: "Is pain present every time you urinate or just  sometimes?"      yes 4. ONSET: "When did the painful urination start?"      yesterday 5. FEVER: "Do you have a fever?" If Yes, ask: "What is your temperature, how was it measured, and when did it start?"     No 6. PAST UTI: "Have you had a urine infection before?" If Yes, ask: "When was the last time?" and "What happened that time?"      Yes--medication for UTI 7. CAUSE: "What do you think is causing the painful urination?"  (e.g., UTI, scratch, Herpes sore)     Possibly UTI 8. OTHER SYMPTOMS: "Do you have any other symptoms?" (e.g., blood in urine, flank pain, genital sores, urgency, vaginal discharge)     Frequency, urgency  Protocols used: Urination Pain - Female-A-AH

## 2023-07-27 LAB — URINE CULTURE

## 2023-07-28 ENCOUNTER — Encounter: Payer: Self-pay | Admitting: Family

## 2023-09-03 ENCOUNTER — Ambulatory Visit: Payer: Medicare Other | Admitting: Family Medicine

## 2023-09-26 ENCOUNTER — Ambulatory Visit: Admitting: Family Medicine

## 2023-09-26 ENCOUNTER — Ambulatory Visit: Payer: Self-pay

## 2023-09-26 ENCOUNTER — Encounter: Payer: Self-pay | Admitting: Family Medicine

## 2023-09-26 VITALS — BP 134/70 | HR 81 | Temp 97.7°F | Wt 213.7 lb

## 2023-09-26 DIAGNOSIS — R300 Dysuria: Secondary | ICD-10-CM

## 2023-09-26 LAB — POC URINALSYSI DIPSTICK (AUTOMATED)
Bilirubin, UA: NEGATIVE
Blood, UA: POSITIVE
Glucose, UA: NEGATIVE
Ketones, UA: NEGATIVE
Nitrite, UA: NEGATIVE
Protein, UA: NEGATIVE
Spec Grav, UA: <=1.005 — AB (ref 1.010–1.025)
Urobilinogen, UA: 0.2 U/dL
pH, UA: 6 (ref 5.0–8.0)

## 2023-09-26 MED ORDER — NITROFURANTOIN MONOHYD MACRO 100 MG PO CAPS: 100.0000 mg | ORAL_CAPSULE | Freq: Two times a day (BID) | ORAL | 0 refills | Status: DC

## 2023-09-26 NOTE — Telephone Encounter (Signed)
   FYI Only or Action Required?: FYI only for provider  Patient was last seen in primary care on 07/25/2023 by Rondall Codding, FNP. Called Nurse Triage reporting burning with urination. Symptoms began yesterday. Interventions attempted: Nothing. Symptoms are: gradually worsening.  Triage Disposition: No disposition on file.  Patient/caregiver understands and will follow disposition?: Copied from CRM [id number]. Topic: Clinical - Red Word Triage >> Sep 26, 2023  2:26 PM Jacqueline Jennings wrote: Red Word that prompted transfer to Nurse Triage: patient called stating she did a urine test at home and it was positive for leukocytes. Patient is having frequent urination and burning Reason for Disposition  Age > 50 years  Answer Assessment - Initial Assessment Questions 1. SEVERITY: "How bad is the pain?"  (e.g., Scale 1-10; mild, moderate, or severe)   - MILD (1-3): complains slightly about urination hurting   - MODERATE (4-7): interferes with normal activities     - SEVERE (8-10): excruciating, unwilling or unable to urinate because of the pain      "Really bad this am" 2. FREQUENCY: "How many times have you had painful urination today?"      Many times 3. PATTERN: "Is pain present every time you urinate or just sometimes?"      Every time 4. ONSET: "When did the painful urination start?"      yesterday 5. FEVER: "Do you have a fever?" If Yes, ask: "What is your temperature, how was it measured, and when did it start?"     no 6. PAST UTI: "Have you had a urine infection before?" If Yes, ask: "When was the last time?" and "What happened that time?"      yes 7. CAUSE: "What do you think is causing the painful urination?"  (e.g., UTI, scratch, Herpes sore)     uti 8. OTHER SYMPTOMS: "Do you have any other symptoms?" (e.g., blood in urine, flank pain, genital sores, urgency, vaginal discharge)     Urgency and frequency, flank pain 9. PREGNANCY: "Is there any chance you are pregnant?" "When was your last  menstrual period?"     na  Protocols used: Urination Pain - Female-A-AH

## 2023-09-26 NOTE — Telephone Encounter (Signed)
 Ok to close

## 2023-09-26 NOTE — Progress Notes (Signed)
 Established Patient Office Visit  Subjective   Patient ID: Jacqueline Jennings, female    DOB: 09-Jul-1950  Age: 73 y.o. MRN: [id number]  Chief Complaint  Patient presents with   Dysuria    HPI   Jacqueline Jennings is seen today with onset yesterday of some mild right flank pain, urine frequency, and burning with urination.  Symptoms were slightly worse this morning.  No fever.  No chills.  Denies any nausea or vomiting.  She has allergy to penicillin.  She had E. coli UTI back in April which resolved with Macrobid .  In February she had negative urine culture.  Generally drinks plenty of fluids.  Past Medical History:  Diagnosis Date   Acute medial meniscal tear 10/02/2011   right   Allergy    seasonal   Arthritis 09/27/2011   Right knee/Cortisone injection 2'13   Breast cancer (HCC) 2006   hx of with rediation, surgery and adjuvant theapy with tamoxifen  (year two)   Cataract    removed   Chronic kidney disease    Kidney stones   Colon polyps 2014   Hyperlipidemia    Hypothyroidism 09/27/2011   Low function   IBS (irritable bowel syndrome)    Irritable bowel syndrome 05/19/2007   Qualifier: Diagnosis of  By: Larrie Po MD, Wilmon Hashimoto    NEPHROLITHIASIS, HX OF 11/26/2006   Qualifier: Diagnosis of  By: Broadus Canes, LPN, Magdaleno Schooling    OSTEOARTHROS UNSPEC WHETHER GEN/LOC [facility] SITE 02/28/2009   Qualifier: Diagnosis of  By: Larrie Po MD, Wilmon Hashimoto    Sleep apnea 09/27/2011   Cpap used ,started 7'8295   Past Surgical History:  Procedure Laterality Date   BREAST BIOPSY Right 1977   BREAST LUMPECTOMY Right 2006   raditon 33 tx-right   CATARACT EXTRACTION Bilateral 2008   COLONOSCOPY     CYSTOSCOPY  1977   DILATION AND CURETTAGE OF UTERUS  2011   done after thickening of uterine lining from tamoxifen   KNEE ARTHROSCOPY  10/02/2011   Procedure: ARTHROSCOPY KNEE;  Surgeon: Aurther Blue, MD;  Location: [facility];  Service: Orthopedics;  Laterality: Right;  with Debridement, right knee medial  meniscus   KNEE ARTHROSCOPY Left 05/2018   left knee arthroscopy  2020   MYOMECTOMY  1990   TONSILLECTOMY      reports that she has never smoked. She has never used smokeless tobacco. She reports current alcohol use. She reports that she does not use drugs. family history includes Diabetes in her father; Hypertension in her mother; Leukemia in her mother; Uterine cancer in her maternal grandmother. Allergies  Allergen Reactions   Adhesive [Tape]    Amoxicillin     Augmentin  [Amoxicillin -Pot Clavulanate] Rash    Drug eruption   Latex Rash    Adhesives products-causes reddness, rash   Nabumetone     REACTION: Rash,Hives    Review of Systems  Constitutional:  Negative for chills and fever.  Gastrointestinal:  Negative for nausea and vomiting.  Genitourinary:  Positive for dysuria and frequency.      Objective:      BP 134/70 (BP Location: Left Arm, Patient Position: Sitting, Cuff Size: Large)   Pulse 81   Temp 97.7 F (36.5 C) (Oral)   Wt 213 lb 11.2 oz (96.9 kg)   SpO2 98%   BMI 34.49 kg/m  BP Readings from Last 3 Encounters:  09/26/23 134/70  07/25/23 122/78  06/18/23 120/68   Wt Readings from Last 3 Encounters:  09/26/23 213  lb 11.2 oz (96.9 kg)  07/25/23 213 lb (96.6 kg)  06/18/23 214 lb (97.1 kg)      Physical Exam Vitals reviewed.  Constitutional:      General: She is not in acute distress.    Appearance: She is not ill-appearing or toxic-appearing.  Cardiovascular:     Rate and Rhythm: Normal rate and regular rhythm.  Pulmonary:     Effort: Pulmonary effort is normal.     Breath sounds: Normal breath sounds. No wheezing or rales.  Neurological:     Mental Status: She is alert.      Results for orders placed or performed in visit on 09/26/23  POCT Urinalysis Dipstick (Automated)  Result Value Ref Range   Color, UA Yellow    Clarity, UA Clear    Glucose, UA Negative Negative   Bilirubin, UA Negative    Ketones, UA Negative    Spec Grav, UA  <=1.005 (A) 1.010 - 1.025   Blood, UA Positive    pH, UA 6.0 5.0 - 8.0   Protein, UA Negative Negative   Urobilinogen, UA 0.2 0.2 or 1.0 E.U./dL   Nitrite, UA Negative    Leukocytes, UA Small (1+) (A) Negative      The 10-year ASCVD risk score (Arnett DK, et al., 2019) is: 15.5%    Assessment & Plan:   Problem List Items Addressed This Visit   None Visit Diagnoses       Dysuria    -  Primary   Relevant Orders   POCT Urinalysis Dipstick (Automated) (Completed)   Urine Culture     1 day history of urine frequency and burning with urination.  Urine dipstick shows 1+ leukocytes and positive blood.  Urine culture sent.  Stay well-hydrated.  Start Macrobid  1 twice daily for 5 days pending culture results.  Follow-up with primary for any persistent or worsening symptoms  No follow-ups on file.    Glean Lamy, MD

## 2023-09-27 LAB — URINE CULTURE
MICRO NUMBER:: 16549217
Result:: NO GROWTH
SPECIMEN QUALITY:: ADEQUATE

## 2023-09-28 ENCOUNTER — Ambulatory Visit: Payer: Self-pay | Admitting: Family Medicine

## 2023-10-06 ENCOUNTER — Ambulatory Visit (INDEPENDENT_AMBULATORY_CARE_PROVIDER_SITE_OTHER): Admitting: Family Medicine

## 2023-10-06 ENCOUNTER — Encounter: Payer: Self-pay | Admitting: Family Medicine

## 2023-10-06 VITALS — BP 132/80 | HR 70 | Temp 98.0°F | Ht 66.0 in | Wt 211.9 lb

## 2023-10-06 DIAGNOSIS — I10 Essential (primary) hypertension: Secondary | ICD-10-CM | POA: Diagnosis not present

## 2023-10-06 DIAGNOSIS — E785 Hyperlipidemia, unspecified: Secondary | ICD-10-CM

## 2023-10-06 DIAGNOSIS — N390 Urinary tract infection, site not specified: Secondary | ICD-10-CM | POA: Insufficient documentation

## 2023-10-06 DIAGNOSIS — M1990 Unspecified osteoarthritis, unspecified site: Secondary | ICD-10-CM | POA: Diagnosis not present

## 2023-10-06 DIAGNOSIS — Z131 Encounter for screening for diabetes mellitus: Secondary | ICD-10-CM | POA: Diagnosis not present

## 2023-10-06 MED ORDER — TELMISARTAN 20 MG PO TABS: 10.0000 mg | ORAL_TABLET | Freq: Every day | ORAL | 1 refills | Status: DC

## 2023-10-06 MED ORDER — MELOXICAM 15 MG PO TABS: 15.0000 mg | ORAL_TABLET | Freq: Every day | ORAL | 1 refills | Status: DC

## 2023-10-06 MED ORDER — NITROFURANTOIN MACROCRYSTAL 50 MG PO CAPS: 50.0000 mg | ORAL_CAPSULE | Freq: Every day | ORAL | 1 refills | Status: DC

## 2023-10-06 NOTE — Assessment & Plan Note (Signed)
 Reviewed labs and notes from previous visits. Will start pt on 50 mg macrobid  daily for the next 6 months.

## 2023-10-06 NOTE — Assessment & Plan Note (Signed)
 On telmisartan  10 mg daily, BP is well controlled today in the office. Will continue as prescribed

## 2023-10-06 NOTE — Assessment & Plan Note (Signed)
 Needs new lipid panel today for surveillance, CT calcium score ordered to help better determine if she needs statin therapy.

## 2023-10-06 NOTE — Progress Notes (Signed)
 Established Patient Office Visit  Subjective   Patient ID: Jacqueline Jennings, female    DOB: 1950-12-23  Age: 73 y.o. MRN: [id number]  Chief Complaint  Patient presents with   Medical Management of Chronic Issues    Pt is here for follow up today. She reports she has had several UTI's in the last 6 months. States that it has been about 4 times since January. She was just recently seen on 6/6 for another UTI. States that she is concerned about the recurrence of the UTI's. We discussed options for prophylaxis including vaginal estrogen vs low dose macrobid , pt is concerned about using the estrogen due to her history of breast cancer.  HTN -- BP in office performed and is well controlled. She  reports no side effects to the medications, no chest pain, SOB, dizziness or headaches. She has a BP cuff at home and is checking BP regularly, reports they are in the normal range. States she usually gets around mid-120's/ 80's at home.   HLD-- 10 year ASCVD score is 15%. I counseled the patient on options for treatment - pt states originally she wanted to try diet and exercise, however she doesn't think she was successful in monitoring her diet. I counseled her about the CT calcium score and offered this test to her.        Current Outpatient Medications  Medication Instructions   acetaminophen  (TYLENOL ) 650 mg, As needed   [place] Thyroid  60 mg, Oral, Daily before breakfast   Cholecalciferol (VITAMIN D -3 PO) 6,000 Units, Daily   COLLAGEN PO 12 g, Daily   fexofenadine (ALLEGRA) 180 mg, Daily   fluocinonide cream (LIDEX) 0.05 %    fluticasone  (CUTIVATE ) 0.05 % cream 2 times daily   ketoconazole (NIZORAL) 2 % cream 1 application , Daily   meloxicam  (MOBIC ) 15 mg, Oral, Daily   nitrofurantoin  (MACRODANTIN ) 50 mg, Oral, Daily at bedtime   nystatin ointment (MYCOSTATIN)    telmisartan  (MICARDIS ) 10 mg, Oral, Daily    Patient Active Problem List   Diagnosis Date Noted   Recurrent UTI  10/06/2023   HTN (hypertension) 02/27/2022   Malignant tumor of breast (HCC) 06/20/2020   Arthritis 06/20/2020   Rosacea 08/17/2018   Tear of medial meniscus of knee 06/15/2018   Pain in left knee 12/16/2017   B12 deficiency 03/08/2016   After cataract not obscuring vision 01/19/2013   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     Objective:     BP 132/80   Pulse 70   Temp 98 F (36.7 C) (Oral)   Ht 5' 6 (1.676 m)   Wt 211 lb 14.4 oz (96.1 kg)   SpO2 96%   BMI 34.20 kg/m     Physical Exam Vitals reviewed.  Constitutional:      Appearance: Normal appearance. She is well-groomed. She is obese.   Cardiovascular:     Rate and Rhythm: Normal rate and regular rhythm.     Heart sounds: S1 normal and S2 normal.  Pulmonary:     Effort: Pulmonary effort is normal.     Breath sounds: Normal breath sounds  and air entry.  Abdominal:     General: Bowel sounds are normal.   Musculoskeletal:     Right lower leg: No edema.     Left lower leg: No edema.   Neurological:     Mental Status: She is alert and oriented to person, place, and time. Mental status is at baseline.     Gait: Gait is intact.   Psychiatric:        Mood and Affect: Mood and affect normal.        Speech: Speech normal.        Behavior: Behavior normal.        Judgment: Judgment normal.      No results found for any visits on 10/06/23.     The 10-year ASCVD risk score (Arnett DK, et al., 2019) is: 15%    Assessment & Plan:  Recurrent UTI Assessment & Plan: Reviewed labs and notes from previous visits. Will start pt on  50 mg macrobid  daily for the next 6 months.   Orders: -     Nitrofurantoin  Macrocrystal; Take 1 capsule (50 mg total) by mouth at bedtime.  Dispense: 90 capsule; Refill: 1  Primary hypertension Assessment & Plan: On telmisartan  10 mg daily, BP is well controlled today in the office. Will continue as prescribed  Orders: -     Telmisartan ; Take 0.5 tablets (10 mg total) by mouth daily.  Dispense: 45 tablet; Refill: 1 -     Comprehensive metabolic panel with GFR; Future  Diabetes mellitus screening -     Hemoglobin A1c; Future  Hyperlipidemia, unspecified hyperlipidemia type Assessment & Plan: Needs new lipid panel today for surveillance, CT calcium score ordered to help better determine if she needs statin therapy.   Orders: -     Lipid panel; Future -     CT CARDIAC SCORING (SELF PAY ONLY); Future  Arthritis -     Meloxicam ; Take 1 tablet (15 mg total) by mouth daily.  Dispense: 90 tablet; Refill: 1     Return in about 6 months (around 04/06/2024) for HTN.    Aida House, MD

## 2023-10-07 LAB — HEMOGLOBIN A1C
Est. average glucose Bld gHb Est-mCnc: 123 mg/dL
Hgb A1c MFr Bld: 5.9 % — ABNORMAL HIGH (ref 4.8–5.6)

## 2023-10-07 LAB — COMPREHENSIVE METABOLIC PANEL WITH GFR
ALT: 21 IU/L (ref 0–32)
AST: 18 IU/L (ref 0–40)
Albumin: 4.4 g/dL (ref 3.8–4.8)
Alkaline Phosphatase: 77 IU/L (ref 44–121)
BUN/Creatinine Ratio: 17 (ref 12–28)
BUN: 13 mg/dL (ref 8–27)
Bilirubin Total: 0.4 mg/dL (ref 0.0–1.2)
CO2: 21 mmol/L (ref 20–29)
Calcium: 9.4 mg/dL (ref 8.7–10.3)
Chloride: 103 mmol/L (ref 96–106)
Creatinine, Ser: 0.76 mg/dL (ref 0.57–1.00)
Globulin, Total: 2 g/dL (ref 1.5–4.5)
Glucose: 89 mg/dL (ref 70–99)
Potassium: 4.1 mmol/L (ref 3.5–5.2)
Sodium: 139 mmol/L (ref 134–144)
Total Protein: 6.4 g/dL (ref 6.0–8.5)
eGFR: 84 mL/min/{1.73_m2} (ref >59)

## 2023-10-07 LAB — LIPID PANEL
Chol/HDL Ratio: 3.6 ratio (ref 0.0–4.4)
Cholesterol, Total: 215 mg/dL — ABNORMAL HIGH (ref 100–199)
HDL: 60 mg/dL (ref >39)
LDL Chol Calc (NIH): 138 mg/dL — ABNORMAL HIGH (ref 0–99)
Triglycerides: 98 mg/dL (ref 0–149)
VLDL Cholesterol Cal: 17 mg/dL (ref 5–40)

## 2023-10-08 ENCOUNTER — Ambulatory Visit: Payer: Self-pay | Admitting: Family Medicine

## 2023-10-09 ENCOUNTER — Ambulatory Visit: Admitting: Family Medicine

## 2023-10-09 DIAGNOSIS — Z0000 Encounter for general adult medical examination without abnormal findings: Secondary | ICD-10-CM

## 2023-10-09 NOTE — Patient Instructions (Signed)
 I really enjoyed getting to talk with you today! I am available on Tuesdays and Thursdays for virtual visits if you have any questions or concerns, or if I can be of any further assistance.   CHECKLIST FROM ANNUAL WELLNESS VISIT:  -Follow up (please call to schedule if not scheduled after visit):   -yearly for annual wellness visit with primary care office  Here is a list of your preventive care/health maintenance measures and the plan for each if any are due:  PLAN For any measures below that may be due:   Can get the vaccines at the pharmacy. Please let us  know if/when you do so that we can update your record.  Health Maintenance  Topic Date Due   COVID-19 Vaccine (7 - 2024-25 season) 12/22/2022   INFLUENZA VACCINE  11/21/2023   MAMMOGRAM  12/16/2023   Medicare Annual Wellness (AWV)  10/08/2024   DTaP/Tdap/Td (3 - Td or Tdap) 02/21/2026   Pneumococcal Vaccine: 50+ Years  Completed   DEXA SCAN  Completed   Hepatitis C Screening  Completed   Zoster Vaccines- Shingrix   Completed   HPV VACCINES  Aged Out   Meningococcal B Vaccine  Aged Out   Colonoscopy  Discontinued    -See a dentist at least yearly  -Get your eyes checked and then per your eye specialist's recommendations  -Other issues addressed today:   1. Please limit alcohol to no more than 1 drink in any given day.    -I have included below further information regarding a healthy whole foods based diet, physical activity guidelines for adults, stress management and opportunities for social connections. I hope you find this information useful.   -----------------------------------------------------------------------------------------------------------------------------------------------------------------------------------------------------------------------------------------------------------    NUTRITION: -eat real food: lots of colorful vegetables (half the plate) and fruits -5-7 servings of vegetables and fruits  per day (fresh or steamed is best), exp. 2 servings of vegetables with lunch and dinner and 2 servings of fruit per day. Berries and greens such as kale and collards are great choices.  -consume on a regular basis:  fresh fruits, fresh veggies, fish, nuts, seeds, healthy oils (such as olive oil, avocado oil), whole grains (make sure for bread/pasta/crackers/etc., that the first ingredient on label contains the word whole), legumes. -can eat small amounts of dairy and lean meat (no larger than the palm of your hand), but avoid processed meats such as ham, bacon, lunch meat, etc. -drink water -try to avoid fast food and pre-packaged foods, processed meat, ultra processed foods/beverages (donuts, candy, etc.) -most experts advise limiting sodium to < 2300mg  per day, should limit further is any chronic conditions such as high blood pressure, heart disease, diabetes, etc. The American Heart Association advised that < 1500mg  is is ideal -try to avoid foods/beverages that contain any ingredients with names you do not recognize  -try to avoid foods/beverages  with added sugar or sweeteners/sweets  -try to avoid sweet drinks (including diet drinks): soda, juice, Gatorade, sweet tea, power drinks, diet drinks -try to avoid white rice, white bread, pasta (unless whole grain)  EXERCISE GUIDELINES FOR ADULTS: -if you wish to increase your physical activity, do so gradually and with the approval of your doctor -STOP and seek medical care immediately if you have any chest pain, chest discomfort or trouble breathing when starting or increasing exercise  -move and stretch your body, legs, feet and arms when sitting for long periods -Physical activity guidelines for optimal health in adults: -get at least 150 minutes per week of moderate  exercise (can talk, but not sing); this is about 20-30 minutes of sustained activity 5-7 days per week or two 10-15 minute episodes of sustained activity 5-7 days per week -do some  muscle building/resistance training/strength training at least 2 days per week  -balance exercises 3+ days per week:   Stand somewhere where you have something sturdy to hold onto if you lose balance    1) lift up on toes, then back down, start with 5x per day and work up to 20x   2) stand and lift one leg straight out to the side so that foot is a few inches of the floor, start with 5x each side and work up to 20x each side   3) stand on one foot, start with 5 seconds each side and work up to 20 seconds on each side  If you need ideas or help with getting more active:  -Silver sneakers https://tools.silversneakers.com  -Walk with a Doc: http://www.duncan-williams.com/  -try to include resistance (weight lifting/strength building) and balance exercises twice per week: or the following link for ideas: http://castillo-powell.com/  BuyDucts.dk  STRESS MANAGEMENT: -can try meditating, or just sitting quietly with deep breathing while intentionally relaxing all parts of your body for 5 minutes daily -if you need further help with stress, anxiety or depression please follow up with your primary doctor or contact the wonderful folks at [facility]: [phone]  SOCIAL CONNECTIONS: -options in [place] if you wish to engage in more social and exercise related activities:  -Silver sneakers https://tools.silversneakers.com  -Walk with a Doc: http://www.duncan-williams.com/  -Check out the [facility] Active Adults 50+ section on the [employer] (hiking clubs, book clubs, cards and games, chess, exercise classes, aquatic classes and much more) - see the website for details: https://www.[place].gov/departments/parks-recreation/active-adults50  -YouTube has lots of exercise videos for different ages and abilities as well  -[facility] (a variety of indoor and outdoor  inperson activities for adults). [phone]. [address]  -Virtual Online Classes (a variety of topics): see seniorplanet.org or call [phone]  -consider volunteering at a school, hospice center, church, senior center or elsewhere

## 2023-10-09 NOTE — Progress Notes (Signed)
 PATIENT CHECK-IN and HEALTH RISK ASSESSMENT QUESTIONNAIRE:  -completed by phone/video for upcoming Medicare Preventive Visit  Pre-Visit Check-in: 1)Vitals (height, wt, BP, etc) - record in vitals section for visit on day of visit Request home vitals (wt, BP, etc.) and enter into vitals, THEN update Vital Signs SmartPhrase below at the top of the HPI. See below.  2)Review and Update Medications, Allergies PMH, Surgeries, Social history in Epic 3)Hospitalizations in the last year with date/reason? n  4)Review and Update Care Team (patient's specialists) in Epic 5) Complete PHQ9 in Epic  6) Complete Fall Screening in Epic 7)Review all Health Maintenance Due and order under PCP if not done.  Medicare Wellness Patient Questionnaire:  Answer theses question about your habits: How often do you have a drink containing alcohol?2-4 monthly How many drinks containing alcohol do you have on a typical day when you are drinking?1-2 How often do you have six or more drinks on one occasion?Never Have you ever smoked?n How many packs a day do/did you smoke? n Do you use smokeless tobacco?n Do you use an illicit drugs?n On average, how many days per week do you engage in moderate to strenuous exercise (like a brisk walk)?4 days per week, home exercise - home PT for legs/knees, weights, balance exercise On average, how many minutes do you engage in exercise at this level?60 minutes Typical breakfast:nut and berries Typical dinner: chicken or fish and veggies Typical snacks:fruits  Beverages: water Social connections: lots of good social connections, clubs, classes  Answer theses question about your everyday activities: Can you perform most household chores? Yes  Are you deaf or have significant trouble hearing?no Do you feel that you have a problem with memory?no Do you feel safe at home?yes  Last dentist visit? Goes every 6 months 8. Do you have any difficulty performing your everyday  activities?no Are you having any difficulty walking, taking medications on your own, and or difficulty managing daily home needs?no Do you have difficulty walking or climbing stairs?no Do you have difficulty dressing or bathing?no Do you have difficulty doing errands alone such as visiting a doctor's office or shopping?no Do you currently have any difficulty preparing food and eating?no Do you currently have any difficulty using the toilet?no Do you have any difficulty managing your finances?no Do you have any difficulties with housekeeping of managing your housekeeping?no   Do you have Advanced Directives in place (Living Will, Healthcare Power or Attorney)?  y   Last eye Exam and location? [facility]   Do you currently use prescribed or non-prescribed narcotic or opioid pain medications?n  Do you have a history or close family history of breast, ovarian, tubal or peritoneal cancer or a family member with BRCA (breast cancer susceptibility 1 and 2) gene mutations? grandmother    ----------------------------------------------------------------------------------------------------------------------------------------------------------------------------------------------------------------------  Because this visit was a virtual/telehealth visit, some criteria may be missing or patient reported. Any vitals not documented were not able to be obtained and vitals that have been documented are patient reported.    MEDICARE ANNUAL PREVENTIVE VISIT WITH PROVIDER: (Welcome to Medicare, initial annual wellness or annual wellness exam)  Virtual Visit via Phone Note  I connected with Jacqueline Jennings on 10/09/23 by phone and verified that I am speaking with the correct person using two identifiers.  Location patient: home Location provider:work or home office Persons participating in the virtual visit: patient, provider  Concerns and/or follow up today: doing well, saw Dr. Bambi Lever  Monday.    See HM section in  Epic for other details of completed HM.    ROS: negative for report of fevers, unintentional weight loss, vision changes, vision loss, hearing loss or change, chest pain, sob, hemoptysis, melena, hematochezia, hematuria, falls, bleeding or bruising, thoughts of suicide or self harm, memory loss  Patient-completed extensive health risk assessment - reviewed and discussed with the patient: See Health Risk Assessment completed with patient prior to the visit either above or in recent phone note. This was reviewed in detailed with the patient today and appropriate recommendations, orders and referrals were placed as needed per Summary below and patient instructions.   Review of Medical History: -PMH, PSH, Family History and current specialty and care providers reviewed and updated and listed below   Patient Care Team: Aida House, MD as PCP - General (Family Medicine) Emilie Harden, MD as Consulting Physician (Internal Medicine) Thurman Flores, MD as Consulting Physician (Obstetrics and Gynecology) Wash Hack, MD as Referring Physician (Dermatology) Rudine Cos, MD as Consulting Physician (Ophthalmology) Wilder Handy, MD (Inactive) as Consulting Physician (Pulmonary Disease)   Past Medical History:  Diagnosis Date   Acute medial meniscal tear 10/02/2011   right   Allergy    seasonal   Arthritis 09/27/2011   Right knee/Cortisone injection 2'13   Breast cancer (HCC) 2006   hx of with rediation, surgery and adjuvant theapy with tamoxifen  (year two)   Cataract    removed   Chronic kidney disease    Kidney stones   Colon polyps 2014   Hyperlipidemia    Hypothyroidism 09/27/2011   Low function   IBS (irritable bowel syndrome)    Irritable bowel syndrome 05/19/2007   Qualifier: Diagnosis of  By: Larrie Po MD, Wilmon Hashimoto    NEPHROLITHIASIS, HX OF 11/26/2006   Qualifier: Diagnosis of  By: Broadus Canes, LPN, Magdaleno Schooling    OSTEOARTHROS  UNSPEC WHETHER GEN/LOC [facility] SITE 02/28/2009   Qualifier: Diagnosis of  By: Larrie Po MD, Wilmon Hashimoto    Sleep apnea 09/27/2011   Cpap used ,started 5'2841    Past Surgical History:  Procedure Laterality Date   BREAST BIOPSY Right 1977   BREAST LUMPECTOMY Right 2006   raditon 33 tx-right   CATARACT EXTRACTION Bilateral 2008   COLONOSCOPY     CYSTOSCOPY  1977   DILATION AND CURETTAGE OF UTERUS  2011   done after thickening of uterine lining from tamoxifen   KNEE ARTHROSCOPY  10/02/2011   Procedure: ARTHROSCOPY KNEE;  Surgeon: Aurther Blue, MD;  Location: [facility];  Service: Orthopedics;  Laterality: Right;  with Debridement, right knee medial meniscus   KNEE ARTHROSCOPY Left 05/2018   left knee arthroscopy  2020   MYOMECTOMY  1990   TONSILLECTOMY      Social History   Socioeconomic History   Marital status: Widowed    Spouse name: Not on file   Number of children: 0   Years of education: 16   Highest education level: Bachelor's degree (e.g., BA, AB, BS)  Occupational History   Occupation: retired  Tobacco Use   Smoking status: Never   Smokeless tobacco: Never  Vaping Use   Vaping status: Never Used  Substance and Sexual Activity   Alcohol use: Yes    Comment: rare   Drug use: No   Sexual activity: Yes  Other Topics Concern   Not on file  Social History Narrative   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, travels a lot      widowed      Spiritual  Beliefs: Lynder Sanger               Social Drivers of Health   Financial Resource Strain: Low Risk  (10/09/2023)   Overall Financial Resource Strain (CARDIA)    Difficulty of Paying Living Expenses: Not hard at all  Food Insecurity: No Food Insecurity (10/09/2023)   Hunger Vital Sign    Worried About Running Out of Food in the Last Year: Never true    Ran Out of Food in the Last Year: Never true  Transportation Needs: No Transportation Needs (10/09/2023)   PRAPARE - Administrator, Civil Service (Medical): No     Lack of Transportation (Non-Medical): No  Physical Activity: Sufficiently Active (10/09/2023)   Exercise Vital Sign    Days of Exercise per Week: 4 days    Minutes of Exercise per Session: 60 min  Stress: No Stress Concern Present (10/09/2023)   [employer] of Occupational Health - Occupational Stress Questionnaire    Feeling of Stress: Not at all  Social Connections: Moderately Integrated (10/09/2023)   Social Connection and Isolation Panel    Frequency of Communication with Friends and Family: More than three times a week    Frequency of Social Gatherings with Friends and Family: Once a week    Attends Religious Services: More than 4 times per year    Active Member of [employer] or Organizations: Yes    Attends Banker Meetings: More than 4 times per year    Marital Status: Widowed  Intimate Partner Violence: Not At Risk (10/09/2023)   Humiliation, Afraid, Rape, and Kick questionnaire    Fear of Current or Ex-Partner: No    Emotionally Abused: No    Physically Abused: No    Sexually Abused: No    Family History  Problem Relation Age of Onset   Hypertension Mother    Leukemia Mother    Diabetes Father    Uterine cancer Maternal Grandmother    Colon cancer Neg Hx    Esophageal cancer Neg Hx    Colon polyps Neg Hx    Rectal cancer Neg Hx    Stomach cancer Neg Hx     Current Outpatient Medications on File Prior to Visit  Medication Sig Dispense Refill   acetaminophen  (TYLENOL ) 650 MG CR tablet Take 650 mg by mouth as needed for pain.     ARMOUR THYROID  60 MG tablet TAKE 1 TABLET BY MOUTH DAILY BEFORE BREAKFAST 45 tablet 2   Cholecalciferol (VITAMIN D -3 PO) Take 6,000 Units by mouth daily.     COLLAGEN PO Take 12 g by mouth daily.     fexofenadine (ALLEGRA) 180 MG tablet Take 180 mg by mouth daily.     fluocinonide cream (LIDEX) 0.05 %      fluticasone  (CUTIVATE ) 0.05 % cream Apply topically 2 (two) times daily.     ketoconazole (NIZORAL) 2 % cream Apply 1  application  topically daily.     meloxicam  (MOBIC ) 15 MG tablet Take 1 tablet (15 mg total) by mouth daily. 90 tablet 1   nitrofurantoin  (MACRODANTIN ) 50 MG capsule Take 1 capsule (50 mg total) by mouth at bedtime. 90 capsule 1   nystatin ointment (MYCOSTATIN)      telmisartan  (MICARDIS ) 20 MG tablet Take 0.5 tablets (10 mg total) by mouth daily. 45 tablet 1   No current facility-administered medications on file prior to visit.    Allergies  Allergen Reactions   Adhesive [Tape]    Amoxicillin   Augmentin  [Amoxicillin -Pot Clavulanate] Rash    Drug eruption   Latex Rash    Adhesives products-causes reddness, rash   Nabumetone     REACTION: Rash,Hives       Physical Exam Vitals requested from patient and listed below if patient had equipment and was able to obtain at home for this virtual visit: There were no vitals filed for this visit. Estimated body mass index is 34.2 kg/m as calculated from the following:   Height as of 10/06/23: 5' 6 (1.676 m).   Weight as of 10/06/23: 211 lb 14.4 oz (96.1 kg).  EKG (optional): deferred due to virtual visit  GENERAL: alert, oriented, no acute distress detected, full vision exam deferred due to pandemic and/or virtual encounter  PSYCH/NEURO: pleasant and cooperative, no obvious depression or anxiety, speech and thought processing grossly intact, Cognitive function grossly intact  Flowsheet Row Office Visit from [facility]/06/2023 in [facility]  PHQ-9 Total Score 0        10/09/2023   11:26 AM 06/06/2023    9:41 AM 09/10/2022    9:22 AM 03/07/2022    2:05 PM 02/27/2022   11:23 AM  Depression screen PHQ 2/9  Decreased Interest 0 0 0 0 0  Down, Depressed, Hopeless 0 0 0 0 0  PHQ - 2 Score 0 0 0 0 0  Altered sleeping  0  0 0  Tired, decreased energy  0  0 1  Change in appetite  0  0 1  Feeling bad or failure about yourself   0  0 0  Trouble concentrating  0  0 0  Moving slowly or fidgety/restless  0  0 0   Suicidal thoughts  0  0 0  PHQ-9 Score  0  0 2  Difficult doing work/chores  Not difficult at all  Not difficult at all Not difficult at all       03/07/2022    2:07 PM 09/10/2022    9:21 AM 09/24/2022    4:59 PM 06/06/2023    9:40 AM 10/08/2023    1:06 PM  Fall Risk  Falls in the past year? 1 0 0 0 0  Was there an injury with Fall? 1 0  0   Was there an injury with Fall? - Comments Sprained left Ankle, Followed by medical attention      Fall Risk Category Calculator 2 0  0   Fall Risk Category (Retired) Moderate       (RETIRED) Patient Fall Risk Level Low fall risk       Patient at Risk for Falls Due to No Fall Risks No Fall Risks  No Fall Risks   Fall risk Follow up Falls prevention discussed  Falls evaluation completed  Falls evaluation completed;Falls prevention discussed      Data saved with a previous flowsheet row definition     SUMMARY AND PLAN:  Encounter for Medicare annual wellness exam    Discussed applicable health maintenance/preventive health measures and advised and referred or ordered per patient preferences: -discussed covid/flu vaccine recs -discussed dexa - due in the winter and she says she will see endo for this Health Maintenance  Topic Date Due   COVID-19 Vaccine (7 - 2024-25 season) 12/22/2022   INFLUENZA VACCINE  11/21/2023   MAMMOGRAM  12/16/2023   Medicare Annual Wellness (AWV)  10/08/2024   DTaP/Tdap/Td (3 - Td or Tdap) 02/21/2026   Pneumococcal Vaccine: 50+ Years  Completed   DEXA SCAN  Completed   Hepatitis C Screening  Completed   Zoster Vaccines- Shingrix   Completed   HPV VACCINES  Aged Out   Meningococcal B Vaccine  Aged Out   Colonoscopy  Discontinued      Education and counseling on the following was provided based on the above review of health and a plan/checklist for the patient, along with additional information discussed, was provided for the patient in the patient instructions :  -Advised and counseled on a healthy lifestyle  - including the importance of a healthy diet, regular physical activity, social connections and stress management. -Reviewed patient's current diet. Advised and counseled on a whole foods based healthy diet. A summary of a healthy diet was provided  -reviewed patient's current physical activity level and discussed exercise guidelines for adults. Discussed community resources and ideas for safe exercise at home to assist in meeting exercise guideline recommendations in a safe and healthy way.  -Advise yearly dental visits at minimum and regular eye exams -Advised and counseled on alcohol safe limits, risks  Follow up: see patient instructions     Patient Instructions  I really enjoyed getting to talk with you today! I am available on Tuesdays and Thursdays for virtual visits if you have any questions or concerns, or if I can be of any further assistance.   CHECKLIST FROM ANNUAL WELLNESS VISIT:  -Follow up (please call to schedule if not scheduled after visit):   -yearly for annual wellness visit with primary care office  Here is a list of your preventive care/health maintenance measures and the plan for each if any are due:  PLAN For any measures below that may be due:   Can get the vaccines at the pharmacy. Please let us  know if/when you do so that we can update your record.  Health Maintenance  Topic Date Due   COVID-19 Vaccine (7 - 2024-25 season) 12/22/2022   INFLUENZA VACCINE  11/21/2023   MAMMOGRAM  12/16/2023   Medicare Annual Wellness (AWV)  10/08/2024   DTaP/Tdap/Td (3 - Td or Tdap) 02/21/2026   Pneumococcal Vaccine: 50+ Years  Completed   DEXA SCAN  Completed   Hepatitis C Screening  Completed   Zoster Vaccines- Shingrix   Completed   HPV VACCINES  Aged Out   Meningococcal B Vaccine  Aged Out   Colonoscopy  Discontinued    -See a dentist at least yearly  -Get your eyes checked and then per your eye specialist's recommendations  -Other issues addressed  today:   1. Please limit alcohol to no more than 1 drink in any given day.    -I have included below further information regarding a healthy whole foods based diet, physical activity guidelines for adults, stress management and opportunities for social connections. I hope you find this information useful.   -----------------------------------------------------------------------------------------------------------------------------------------------------------------------------------------------------------------------------------------------------------    NUTRITION: -eat real food: lots of colorful vegetables (half the plate) and fruits -5-7 servings of vegetables and fruits per day (fresh or steamed is best), exp. 2 servings of vegetables with lunch and dinner and 2 servings of fruit per day. Berries and greens such as kale and collards are great choices.  -consume on a regular basis:  fresh fruits, fresh veggies, fish, nuts, seeds, healthy oils (such as olive oil, avocado oil), whole grains (make sure for bread/pasta/crackers/etc., that the first ingredient on label contains the word whole), legumes. -can eat small amounts of dairy and lean meat (no larger than the palm of your hand), but avoid processed meats such as  ham, bacon, lunch meat, etc. -drink water -try to avoid fast food and pre-packaged foods, processed meat, ultra processed foods/beverages (donuts, candy, etc.) -most experts advise limiting sodium to < 2300mg  per day, should limit further is any chronic conditions such as high blood pressure, heart disease, diabetes, etc. The American Heart Association advised that < 1500mg  is is ideal -try to avoid foods/beverages that contain any ingredients with names you do not recognize  -try to avoid foods/beverages  with added sugar or sweeteners/sweets  -try to avoid sweet drinks (including diet drinks): soda, juice, Gatorade, sweet tea, power drinks, diet drinks -try to avoid white  rice, white bread, pasta (unless whole grain)  EXERCISE GUIDELINES FOR ADULTS: -if you wish to increase your physical activity, do so gradually and with the approval of your doctor -STOP and seek medical care immediately if you have any chest pain, chest discomfort or trouble breathing when starting or increasing exercise  -move and stretch your body, legs, feet and arms when sitting for long periods -Physical activity guidelines for optimal health in adults: -get at least 150 minutes per week of moderate exercise (can talk, but not sing); this is about 20-30 minutes of sustained activity 5-7 days per week or two 10-15 minute episodes of sustained activity 5-7 days per week -do some muscle building/resistance training/strength training at least 2 days per week  -balance exercises 3+ days per week:   Stand somewhere where you have something sturdy to hold onto if you lose balance    1) lift up on toes, then back down, start with 5x per day and work up to 20x   2) stand and lift one leg straight out to the side so that foot is a few inches of the floor, start with 5x each side and work up to 20x each side   3) stand on one foot, start with 5 seconds each side and work up to 20 seconds on each side  If you need ideas or help with getting more active:  -Silver sneakers https://tools.silversneakers.com  -Walk with a Doc: http://www.duncan-williams.com/  -try to include resistance (weight lifting/strength building) and balance exercises twice per week: or the following link for ideas: http://castillo-powell.com/  BuyDucts.dk  STRESS MANAGEMENT: -can try meditating, or just sitting quietly with deep breathing while intentionally relaxing all parts of your body for 5 minutes daily -if you need further help with stress, anxiety or depression please follow up with your primary doctor or contact the wonderful folks  at [facility]: [phone]  SOCIAL CONNECTIONS: -options in [place] if you wish to engage in more social and exercise related activities:  -Silver sneakers https://tools.silversneakers.com  -Walk with a Doc: http://www.duncan-williams.com/  -Check out the [facility] Active Adults 50+ section on the [place] of [employer] (hiking clubs, book clubs, cards and games, chess, exercise classes, aquatic classes and much more) - see the website for details: https://www.[place].gov/departments/parks-recreation/active-adults50  -YouTube has lots of exercise videos for different ages and abilities as well  -[facility] (a variety of indoor and outdoor inperson activities for adults). [phone]. [address]  -Virtual Online Classes (a variety of topics): see seniorplanet.org or call (434) [phone]  -consider volunteering at a school, hospice center, church, senior center or elsewhere            Maurie Southern, DO

## 2023-10-29 DIAGNOSIS — M1711 Unilateral primary osteoarthritis, right knee: Secondary | ICD-10-CM | POA: Diagnosis not present

## 2023-10-29 DIAGNOSIS — M170 Bilateral primary osteoarthritis of knee: Secondary | ICD-10-CM | POA: Diagnosis not present

## 2023-11-05 ENCOUNTER — Ambulatory Visit (HOSPITAL_BASED_OUTPATIENT_CLINIC_OR_DEPARTMENT_OTHER)
Admission: RE | Admit: 2023-11-05 | Discharge: 2023-11-05 | Disposition: A | Discharge status: Home / Self Care | Payer: Self-pay | Source: Ambulatory Visit | Attending: Family Medicine | Admitting: Family Medicine

## 2023-11-05 DIAGNOSIS — E785 Hyperlipidemia, unspecified: Secondary | ICD-10-CM | POA: Insufficient documentation

## 2023-12-02 ENCOUNTER — Other Ambulatory Visit: Payer: Self-pay | Admitting: Internal Medicine

## 2023-12-25 DIAGNOSIS — H43813 Vitreous degeneration, bilateral: Secondary | ICD-10-CM | POA: Diagnosis not present

## 2023-12-25 DIAGNOSIS — H0100A Unspecified blepharitis right eye, upper and lower eyelids: Secondary | ICD-10-CM | POA: Diagnosis not present

## 2023-12-25 DIAGNOSIS — H40013 Open angle with borderline findings, low risk, bilateral: Secondary | ICD-10-CM | POA: Diagnosis not present

## 2023-12-25 DIAGNOSIS — H52203 Unspecified astigmatism, bilateral: Secondary | ICD-10-CM | POA: Diagnosis not present

## 2023-12-25 DIAGNOSIS — H04123 Dry eye syndrome of bilateral lacrimal glands: Secondary | ICD-10-CM | POA: Diagnosis not present

## 2023-12-25 DIAGNOSIS — H0100B Unspecified blepharitis left eye, upper and lower eyelids: Secondary | ICD-10-CM | POA: Diagnosis not present

## 2023-12-30 DIAGNOSIS — R928 Other abnormal and inconclusive findings on diagnostic imaging of breast: Secondary | ICD-10-CM | POA: Diagnosis not present

## 2023-12-30 LAB — HM MAMMOGRAPHY

## 2024-01-02 ENCOUNTER — Ambulatory Visit: Payer: Self-pay | Admitting: Family Medicine

## 2024-01-02 ENCOUNTER — Encounter: Payer: Self-pay | Admitting: Family Medicine

## 2024-01-21 ENCOUNTER — Ambulatory Visit (INDEPENDENT_AMBULATORY_CARE_PROVIDER_SITE_OTHER): Admitting: Internal Medicine

## 2024-01-21 ENCOUNTER — Encounter: Payer: Self-pay | Admitting: Internal Medicine

## 2024-01-21 VITALS — BP 110/80 | HR 70 | Wt 211.6 lb

## 2024-01-21 DIAGNOSIS — J069 Acute upper respiratory infection, unspecified: Secondary | ICD-10-CM

## 2024-01-21 NOTE — Progress Notes (Signed)
 Established Patient Office Visit     CC/Reason for Visit: URI symptoms  HPI: Jacqueline Jennings is a 73 y.o. female who is coming in today for the above mentioned reasons.  Just returned from a 2-week [place] cruise.  About a week ago started experiencing congestion with runny nose, no sore throat, very fatigued, no fevers.  Some ear congestion right greater than left   Past Medical/Surgical History: Past Medical History:  Diagnosis Date   Acute medial meniscal tear 10/02/2011   right   Allergy    seasonal   Arthritis 09/27/2011   Right knee/Cortisone injection 2'13   Breast cancer (HCC) 2006   hx of with rediation, surgery and adjuvant theapy with tamoxifen  (year two)   Cataract    removed   Chronic kidney disease    Kidney stones   Colon polyps 2014   Hyperlipidemia    Hypothyroidism 09/27/2011   Low function   IBS (irritable bowel syndrome)    Irritable bowel syndrome 05/19/2007   Qualifier: Diagnosis of  By: Mavis MD, Norleen BRAVO    NEPHROLITHIASIS, HX OF 11/26/2006   Qualifier: Diagnosis of  By: Trudy, LPN, Kendell HERO    OSTEOARTHROS UNSPEC WHETHER GEN/LOC [facility] SITE 02/28/2009   Qualifier: Diagnosis of  By: Mavis MD, Norleen BRAVO    Sleep apnea 09/27/2011   Cpap used ,started 7'7988    Past Surgical History:  Procedure Laterality Date   BREAST BIOPSY Right 1977   BREAST LUMPECTOMY Right 2006   raditon 33 tx-right   CATARACT EXTRACTION Bilateral 2008   COLONOSCOPY     CYSTOSCOPY  1977   DILATION AND CURETTAGE OF UTERUS  2011   done after thickening of uterine lining from tamoxifen   KNEE ARTHROSCOPY  10/02/2011   Procedure: ARTHROSCOPY KNEE;  Surgeon: Dempsey LULLA Moan, MD;  Location: [facility];  Service: Orthopedics;  Laterality: Right;  with Debridement, right knee medial meniscus   KNEE ARTHROSCOPY Left 05/2018   left knee arthroscopy  2020   MYOMECTOMY  1990   TONSILLECTOMY      Social History:  reports that she has never smoked. She has never used  smokeless tobacco. She reports current alcohol use. She reports that she does not use drugs.  Allergies: Allergies  Allergen Reactions   Adhesive [Tape]    Amoxicillin     Augmentin  [Amoxicillin -Pot Clavulanate] Rash    Drug eruption   Latex Rash    Adhesives products-causes reddness, rash   Nabumetone     REACTION: Rash,Hives    Family History:  Family History  Problem Relation Age of Onset   Hypertension Mother    Leukemia Mother    Diabetes Father    Uterine cancer Maternal Grandmother    Colon cancer Neg Hx    Esophageal cancer Neg Hx    Colon polyps Neg Hx    Rectal cancer Neg Hx    Stomach cancer Neg Hx      Current Outpatient Medications:    acetaminophen  (TYLENOL ) 650 MG CR tablet, Take 650 mg by mouth as needed for pain., Disp: , Rfl:    ARMOUR THYROID  60 MG tablet, TAKE 1 TABLET BY MOUTH DAILY BEFORE BREAKFAST, Disp: 90 tablet, Rfl: 1   Cholecalciferol (VITAMIN D -3 PO), Take 6,000 Units by mouth daily., Disp: , Rfl:    fexofenadine (ALLEGRA) 180 MG tablet, Take 180 mg by mouth daily., Disp: , Rfl:    fluocinonide cream (LIDEX) 0.05 %, , Disp: , Rfl:  fluticasone  (CUTIVATE ) 0.05 % cream, Apply topically 2 (two) times daily., Disp: , Rfl:    ketoconazole (NIZORAL) 2 % cream, Apply 1 application  topically daily., Disp: , Rfl:    meloxicam  (MOBIC ) 15 MG tablet, Take 1 tablet (15 mg total) by mouth daily., Disp: 90 tablet, Rfl: 1   nitrofurantoin  (MACRODANTIN ) 50 MG capsule, Take 1 capsule (50 mg total) by mouth at bedtime., Disp: 90 capsule, Rfl: 1   nystatin ointment (MYCOSTATIN), , Disp: , Rfl:    telmisartan  (MICARDIS ) 20 MG tablet, Take 0.5 tablets (10 mg total) by mouth daily., Disp: 45 tablet, Rfl: 1   COLLAGEN PO, Take 12 g by mouth daily., Disp: , Rfl:   Review of Systems:  Negative unless indicated in HPI.   Physical Exam: Vitals:   01/21/24 0938  BP: 110/80  Pulse: 70  SpO2: 97%  Weight: 211 lb 9.6 oz (96 kg)    Body mass index is 34.15  kg/m.   Physical Exam Vitals reviewed.  Constitutional:      Appearance: Normal appearance.  HENT:     Right Ear: Tympanic membrane, ear canal and external ear normal.     Left Ear: Tympanic membrane, ear canal and external ear normal.     Mouth/Throat:     Mouth: Mucous membranes are moist.     Pharynx: Posterior oropharyngeal erythema present.  Eyes:     Conjunctiva/sclera: Conjunctivae normal.  Cardiovascular:     Rate and Rhythm: Normal rate and regular rhythm.  Pulmonary:     Effort: Pulmonary effort is normal.     Breath sounds: Normal breath sounds.  Neurological:     Mental Status: She is alert.      Impression and Plan:  Upper respiratory tract infection, unspecified type   -Given exam findings, PNA, pharyngitis, ear infection are not likely, hence abx have not been prescribed. -Have advised rest, fluids, OTC antihistamines, cough suppressants and mucinex. -RTC if no improvement in 10-14 days.   Time spent:22 minutes reviewing chart, interviewing and examining patient and formulating plan of care.     Tully Theophilus Andrews, MD [facility]

## 2024-03-08 DIAGNOSIS — L821 Other seborrheic keratosis: Secondary | ICD-10-CM | POA: Diagnosis not present

## 2024-03-08 DIAGNOSIS — D225 Melanocytic nevi of trunk: Secondary | ICD-10-CM | POA: Diagnosis not present

## 2024-03-08 DIAGNOSIS — D1801 Hemangioma of skin and subcutaneous tissue: Secondary | ICD-10-CM | POA: Diagnosis not present

## 2024-03-31 DIAGNOSIS — Z01419 Encounter for gynecological examination (general) (routine) without abnormal findings: Secondary | ICD-10-CM | POA: Diagnosis not present

## 2024-03-31 DIAGNOSIS — N390 Urinary tract infection, site not specified: Secondary | ICD-10-CM | POA: Diagnosis not present

## 2024-03-31 DIAGNOSIS — Z6835 Body mass index (BMI) 35.0-35.9, adult: Secondary | ICD-10-CM | POA: Diagnosis not present

## 2024-03-31 DIAGNOSIS — Z124 Encounter for screening for malignant neoplasm of cervix: Secondary | ICD-10-CM | POA: Diagnosis not present

## 2024-04-05 ENCOUNTER — Encounter: Payer: Self-pay | Admitting: Internal Medicine

## 2024-04-05 ENCOUNTER — Other Ambulatory Visit

## 2024-04-05 ENCOUNTER — Ambulatory Visit: Payer: Medicare Other | Admitting: Internal Medicine

## 2024-04-05 VITALS — BP 120/80 | HR 82 | Ht 66.0 in | Wt 216.0 lb

## 2024-04-05 DIAGNOSIS — E039 Hypothyroidism, unspecified: Secondary | ICD-10-CM

## 2024-04-05 DIAGNOSIS — M899 Disorder of bone, unspecified: Secondary | ICD-10-CM | POA: Diagnosis not present

## 2024-04-05 DIAGNOSIS — M949 Disorder of cartilage, unspecified: Secondary | ICD-10-CM | POA: Diagnosis not present

## 2024-04-05 DIAGNOSIS — M85852 Other specified disorders of bone density and structure, left thigh: Secondary | ICD-10-CM | POA: Diagnosis not present

## 2024-04-05 DIAGNOSIS — E559 Vitamin D deficiency, unspecified: Secondary | ICD-10-CM

## 2024-04-05 DIAGNOSIS — M85851 Other specified disorders of bone density and structure, right thigh: Secondary | ICD-10-CM

## 2024-04-05 NOTE — Progress Notes (Signed)
 Patient ID: Jacqueline Jennings, female   DOB: 12-Jan-1951, 73 y.o.   MRN: [id number]  HPI  Jacqueline Jennings is a 73 y.o.-year-old female, returning for follow-up for hypothyroidism, osteopenia, and vitamin D  deficiency. Last visit 1 year ago.  Interim history: No falls or fractures since last visit.  No dizziness, disequilibrium, vertigo. She has joint pains (knees).  She previously got steroid injections in her knees.  Reviewed and addended history: Pt. has been dx with hypothyroidism in 2007-2008; started on Synthroid >> not feeling better >> switched desiccated thyroid  extract.  Her TSH was overly suppressed in the past. She was initially on a high dose of Armour: 120 mg, which we subsequently decreased to 90 mg, and then further to 60 mg.  Labs normalized and remained controlled afterwards.  She was on Nature-Throid >> now Armour 60 mg daily (due to availability): - in am - fasting - at least 30 min from b'fast and coffee with collagen - stopped calcium 500 mg  - no iron - no multivitamins - no PPIs - not on Biotin  Reviewed her TFTs: Lab Results  Component Value Date   TSH 2.45 05/12/2023   TSH 2.89 09/11/2022   TSH 2.14 01/03/2022   TSH 2.35 08/15/2021   TSH 1.67 03/07/2021   TSH 1.88 06/30/2020   TSH 2.17 11/24/2019   TSH 3.18 02/15/2019   TSH 2.48 02/17/2018   TSH 2.88 03/07/2017   FREET4 1.1 05/12/2023   FREET4 0.76 01/03/2022   FREET4 0.64 03/07/2021   FREET4 0.79 06/30/2020   FREET4 0.75 02/15/2019   FREET4 0.79 02/17/2018   FREET4 0.74 03/07/2017   FREET4 0.62 03/08/2016   FREET4 0.67 03/09/2015   FREET4 0.69 09/08/2014    Pt denies: - feeling nodules in neck - hoarseness - dysphagia - choking  Osteopenia: 03/29/2022 ([place]) Lumbar spine L1-L4(L3) Femoral neck (FN)  T-score -0.5 RFN: -1.3 LFN: -1.7  Change in BMD from previous DXA test (%) Down 3.7%* Down 6.2%*  (*) statistically significant    FRAX 10-year fracture risk calculator: 15.7 %  for any major fracture and 2.5% for hip fracture. Pharmacologic therapy is recommended if 10 year fracture risk is >20% for any major osteoporotic fracture or >3% for hip fracture.   L3 vertebra had to be excluded from analysis due to DJD  12/07/2019 Lumbar spine L1-L4 Femoral neck (FN)  T-score -0.1 RFN: -1.1 LFN: -1.2  Change in BMD from previous DXA test (%) +6.4%* -7.0%*  (*) statistically significant  After the latest results returned in 2023, I suggested Fosamax 35 mg weekly. She wanted to wait until next BMD to see if we need need to start.  She is starting to exercise at the gym since last visit. She did not start - will start after the Holidays, when she changes her insurance.   Vitamin D  deficiency:  She was on 2000, then 4000 units vitamin D  daily, then 4000 units - drops. She added vitamin D  in MVI and Ca-vit D (total 5500 units a day) >> now 6000 units daily (gtts).  Vitamin D  levels reviewed: Lab Results  Component Value Date   VD25OH 46 05/12/2023   VD25OH 36.30 09/11/2022   VD25OH 32.71 01/03/2022   VD25OH 25.25 (L) 08/15/2021   VD25OH 22.93 (L) 08/02/2021   She also has a history of BrCa 2006 >> had radiation therapy to right breast.  She lost her husband in 07/2016 due to esophageal cancer.  She was undergoing grief counseling.  ROS: + see HPI  I reviewed pt's medications, allergies, PMH, social hx, family hx, and changes were documented in the history of present illness. Otherwise, unchanged from my initial visit note.  Past Medical History:  Diagnosis Date   Acute medial meniscal tear 10/02/2011   right   Allergy    seasonal   Arthritis 09/27/2011   Right knee/Cortisone injection 2'13   Breast cancer (HCC) 2006   hx of with rediation, surgery and adjuvant theapy with tamoxifen  (year two)   Cataract    removed   Chronic kidney disease    Kidney stones   Colon polyps 2014   Hyperlipidemia    Hypothyroidism 09/27/2011   Low function   IBS  (irritable bowel syndrome)    Irritable bowel syndrome 05/19/2007   Qualifier: Diagnosis of  By: Mavis MD, Norleen BRAVO    NEPHROLITHIASIS, HX OF 11/26/2006   Qualifier: Diagnosis of  By: Trudy, LPN, Kendell HERO    OSTEOARTHROS UNSPEC WHETHER GEN/LOC [facility] SITE 02/28/2009   Qualifier: Diagnosis of  By: Mavis MD, Norleen BRAVO    Sleep apnea 09/27/2011   Cpap used ,started 7'7988   Past Surgical History:  Procedure Laterality Date   BREAST BIOPSY Right 1977   BREAST LUMPECTOMY Right 2006   raditon 33 tx-right   CATARACT EXTRACTION Bilateral 2008   COLONOSCOPY     CYSTOSCOPY  1977   DILATION AND CURETTAGE OF UTERUS  2011   done after thickening of uterine lining from tamoxifen   KNEE ARTHROSCOPY  10/02/2011   Procedure: ARTHROSCOPY KNEE;  Surgeon: Dempsey LULLA Moan, MD;  Location: [facility];  Service: Orthopedics;  Laterality: Right;  with Debridement, right knee medial meniscus   KNEE ARTHROSCOPY Left 05/2018   left knee arthroscopy  2020   MYOMECTOMY  1990   TONSILLECTOMY     Social History   Socioeconomic History   Marital status: Widowed    Spouse name: Not on file   Number of children: 0   Years of education: 16   Highest education level: Bachelor's degree (e.g., BA, AB, BS)  Occupational History   Occupation: retired  Tobacco Use   Smoking status: Never   Smokeless tobacco: Never  Vaping Use   Vaping status: Never Used  Substance and Sexual Activity   Alcohol use: Yes    Comment: rare   Drug use: No   Sexual activity: Yes  Other Topics Concern   Not on file  Social History Narrative   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, travels a lot      widowed      Spiritual Beliefs: Christian               Social Drivers of Health   Tobacco Use: Low Risk (01/21/2024)   Patient History    Smoking Tobacco Use: Never    Smokeless Tobacco Use: Never    Passive Exposure: Not on file  Financial Resource Strain: Low Risk (04/04/2024)   Overall Financial Resource Strain  (CARDIA)    Difficulty of Paying Living Expenses: Not hard at all  Food Insecurity: No Food Insecurity (04/04/2024)   Epic    Worried About Radiation Protection Practitioner of Food in the Last Year: Never true    Ran Out of Food in the Last Year: Never true  Transportation Needs: No Transportation Needs (04/04/2024)   Epic    Lack of Transportation (Medical): No    Lack of Transportation (Non-Medical): No  Physical Activity: Unknown (04/04/2024)  Exercise Vital Sign    Days of Exercise per Week: Patient declined    Minutes of Exercise per Session: Not on file  Stress: No Stress Concern Present (04/04/2024)   [employer] of Occupational Health - Occupational Stress Questionnaire    Feeling of Stress: Not at all  Social Connections: Moderately Integrated (04/04/2024)   Social Connection and Isolation Panel    Frequency of Communication with Friends and Family: More than three times a week    Frequency of Social Gatherings with Friends and Family: Three times a week    Attends Religious Services: More than 4 times per year    Active Member of Clubs or Organizations: Yes    Attends Banker Meetings: More than 4 times per year    Marital Status: Widowed  Intimate Partner Violence: Not At Risk (10/09/2023)   Epic    Fear of Current or Ex-Partner: No    Emotionally Abused: No    Physically Abused: No    Sexually Abused: No  Depression (PHQ2-9): Low Risk (10/09/2023)   Depression (PHQ2-9)    PHQ-2 Score: 0  Alcohol Screen: Low Risk (04/04/2024)   Alcohol Screen    Last Alcohol Screening Score (AUDIT): 2  Housing: Low Risk (04/04/2024)   Epic    Unable to Pay for Housing in the Last Year: No    Number of Times Moved in the Last Year: 0    Homeless in the Last Year: No  Utilities: Not At Risk (10/09/2023)   Epic    Threatened with loss of utilities: No  Health Literacy: Not on file   Current Outpatient Medications on File Prior to Visit  Medication Sig Dispense Refill    acetaminophen  (TYLENOL ) 650 MG CR tablet Take 650 mg by mouth as needed for pain.     ARMOUR THYROID  60 MG tablet TAKE 1 TABLET BY MOUTH DAILY BEFORE BREAKFAST 90 tablet 1   Cholecalciferol (VITAMIN D -3 PO) Take 6,000 Units by mouth daily.     COLLAGEN PO Take 12 g by mouth daily.     fexofenadine (ALLEGRA) 180 MG tablet Take 180 mg by mouth daily.     fluocinonide cream (LIDEX) 0.05 %      fluticasone  (CUTIVATE ) 0.05 % cream Apply topically 2 (two) times daily.     ketoconazole (NIZORAL) 2 % cream Apply 1 application  topically daily.     meloxicam  (MOBIC ) 15 MG tablet Take 1 tablet (15 mg total) by mouth daily. 90 tablet 1   nitrofurantoin  (MACRODANTIN ) 50 MG capsule Take 1 capsule (50 mg total) by mouth at bedtime. 90 capsule 1   nystatin ointment (MYCOSTATIN)      telmisartan  (MICARDIS ) 20 MG tablet Take 0.5 tablets (10 mg total) by mouth daily. 45 tablet 1   No current facility-administered medications on file prior to visit.   Allergies  Allergen Reactions   Adhesive [Tape]    Amoxicillin     Augmentin  [Amoxicillin -Pot Clavulanate] Rash    Drug eruption   Latex Rash    Adhesives products-causes reddness, rash   Nabumetone     REACTION: Rash,Hives   Family History  Problem Relation Age of Onset   Hypertension Mother    Leukemia Mother    Diabetes Father    Uterine cancer Maternal Grandmother    Colon cancer Neg Hx    Esophageal cancer Neg Hx    Colon polyps Neg Hx    Rectal cancer Neg Hx    Stomach cancer Neg Hx  PE: BP 120/80   Pulse 82   Ht 5' 6 (1.676 m)   Wt 216 lb (98 kg)   SpO2 96%   BMI 34.86 kg/m   Wt Readings from Last 10 Encounters:  04/05/24 216 lb (98 kg)  01/21/24 211 lb 9.6 oz (96 kg)  10/06/23 211 lb 14.4 oz (96.1 kg)  09/26/23 213 lb 11.2 oz (96.9 kg)  07/25/23 213 lb (96.6 kg)  06/18/23 214 lb (97.1 kg)  06/06/23 214 lb 12.8 oz (97.4 kg)  05/29/23 214 lb (97.1 kg)  04/04/23 212 lb (96.2 kg)  03/06/23 211 lb 8 oz (95.9 kg)    Constitutional: overweight, in NAD Eyes: EOMI, no exophthalmos ENT: no thyromegaly, no cervical lymphadenopathy Cardiovascular: RRR, No MRG Respiratory: CTA B Musculoskeletal: no deformities Skin: no rashes Neurological: no tremor with outstretched hands  ASSESSMENT: 1. Hypothyroidism - on desiccated thyroid  extract  2. Osteopenia  3.  Vitamin D  deficiency  PLAN:  1. Patient with longstanding hypothyroidism, on desiccated thyroid  extract, previously on Nature-Throid, now on Armour thyroid  - latest thyroid  labs reviewed with pt. >> normal: Lab Results  Component Value Date   TSH 2.45 05/12/2023  - she continues on Armour 60 mg daily - pt feels good on this dose. - we discussed about taking the thyroid  hormone every day, with water, >30 minutes before breakfast, separated by >4 hours from acid reflux medications, calcium, iron, multivitamins. Pt. is taking it correctly. - she inquires about the risk of thyroid  cancer from GLP-1 receptor agonist.  We discussed about the association with medullary thyroid  cancer. - will check thyroid  tests today: TSH and fT4 - If labs are abnormal, she will need to return for repeat TFTs in 1.5 months - OTW, I will see her back in a year  2. Osteopenia and 3.  Vitamin D  deficiency - No falls or fractures since last visit - Reviewed the latest bone density scan report from 03/2022: T-scores were worse - 2 years ago, I suggested Maximal Fosamax dose, 35 mg weekly, for treatment of osteopenia, but she declined, deciding to wait for the next bone density to see if she absolutely needed to start this. - She is now due for another bone density scan-will order - For last visit she started exercising at the gym - Latest vitamin D  level was normal: Lab Results  Component Value Date   VD25OH 46 05/12/2023   VD25OH 36.30 09/11/2022  - Will continue 6000 units vitamin D  daily (drops) -recently missed few doses - Before last visit she started 1000 mg  calcium from supplements daily.  We discussed about decreasing the dose to only 500 mg daily and getting the rest from the diet.  We discussed about foods that contain higher calcium months and advised her to obtain 1000 to 1200 mg daily from diet +/- supplements -Will let her know about the bone density results  Orders Placed This Encounter  Procedures   DG Bone Density   VITAMIN D  25 Hydroxy (Vit-D Deficiency, Fractures)   TSH   T4, free   Lela Fendt, MD PhD [facility]

## 2024-04-05 NOTE — Patient Instructions (Addendum)
 Please continue Armour 60 mg daily.  Take the thyroid  hormones every day, with water, at least 30 minutes before breakfast, separated by at least 4 hours from: - acid reflux medications - calcium - iron - multivitamins  Please stop at the lab.  Please call and schedule bone density scan at the [facility] Office: [phone].   Please come back for a follow-up appointment in 1 year.

## 2024-04-06 ENCOUNTER — Ambulatory Visit: Payer: Self-pay | Admitting: Internal Medicine

## 2024-04-06 LAB — TSH: TSH: 2.27 m[IU]/L (ref 0.40–4.50)

## 2024-04-06 LAB — T4, FREE: Free T4: 1 ng/dL (ref 0.8–1.8)

## 2024-04-06 LAB — VITAMIN D 25 HYDROXY (VIT D DEFICIENCY, FRACTURES): Vit D, 25-Hydroxy: 42 ng/mL (ref 30–100)

## 2024-04-06 MED ORDER — ARMOUR THYROID 60 MG PO TABS: 60.0000 mg | ORAL_TABLET | Freq: Every day | ORAL | 3 refills | Status: AC

## 2024-04-06 NOTE — Addendum Note (Signed)
 Addended by: TRIXIE FILE on: 04/06/2024 08:38 AM   Modules accepted: Orders

## 2024-04-07 ENCOUNTER — Inpatient Hospital Stay: Admission: RE | Admit: 2024-04-07 | Discharge: 2024-04-07 | Attending: Internal Medicine | Admitting: Internal Medicine

## 2024-04-07 DIAGNOSIS — M85852 Other specified disorders of bone density and structure, left thigh: Secondary | ICD-10-CM | POA: Diagnosis not present

## 2024-04-07 DIAGNOSIS — M85851 Other specified disorders of bone density and structure, right thigh: Secondary | ICD-10-CM | POA: Diagnosis not present

## 2024-04-07 DIAGNOSIS — M949 Disorder of cartilage, unspecified: Secondary | ICD-10-CM | POA: Diagnosis not present

## 2024-04-08 ENCOUNTER — Encounter: Payer: Self-pay | Admitting: Family Medicine

## 2024-04-08 ENCOUNTER — Ambulatory Visit: Admitting: Family Medicine

## 2024-04-08 VITALS — BP 102/70 | HR 75 | Temp 98.2°F | Ht 66.0 in | Wt 217.3 lb

## 2024-04-08 DIAGNOSIS — M1990 Unspecified osteoarthritis, unspecified site: Secondary | ICD-10-CM | POA: Diagnosis not present

## 2024-04-08 DIAGNOSIS — I10 Essential (primary) hypertension: Secondary | ICD-10-CM | POA: Diagnosis not present

## 2024-04-08 DIAGNOSIS — R7303 Prediabetes: Secondary | ICD-10-CM

## 2024-04-08 LAB — HEMOGLOBIN A1C: Hgb A1c MFr Bld: 5.7 % (ref 4.6–6.5)

## 2024-04-08 MED ORDER — TELMISARTAN 20 MG PO TABS: 10.0000 mg | ORAL_TABLET | Freq: Every day | ORAL | 1 refills | Status: AC

## 2024-04-08 MED ORDER — MELOXICAM 15 MG PO TABS: 15.0000 mg | ORAL_TABLET | Freq: Every day | ORAL | 1 refills | Status: AC

## 2024-04-08 NOTE — Progress Notes (Signed)
 Established Patient Office Visit  Subjective   Patient ID: Jacqueline Jennings, female    DOB: 1950/09/20  Age: 73 y.o. MRN: [id number]  Chief Complaint  Patient presents with   Medical Management of Chronic Issues    HPI Discussed the use of AI scribe software for clinical note transcription with the patient, who gave verbal consent to proceed.  History of Present Illness   Jacqueline Jennings is a 73 year old female who presents for follow-up on long-term antibiotic use and discussion of estrogen cream therapy.  She has taken Macrobid  50 mg daily for six months for UTI prevention and has had no UTIs during this time.  She recently discussed vaginal estrogen with her gynecologist and was prescribed Estrace  cream three times a week. She has not started it yet due to concern about her prior breast cancer and wants to review safety and risks today.  A recent heart scan showed mild peripheral anterior lateral right middle lobe scarring, possibly related to past breast radiation. It also showed aortic atherosclerosis with soft plaques and a calcium score of zero.  She has no family history of heart disease and reports a healthy lifestyle.  Her current medications include blood pressure medication, meloxicam  for joint pain, and thyroid  medication. Recent thyroid  function tests were normal. Past A1c values have been mildly elevated up to 6.1 but not in the diabetic range.  No UTIs in the past six months.       Current Outpatient Medications  Medication Instructions   acetaminophen  (TYLENOL ) 650 mg, As needed   Armour Thyroid  60 mg, Oral, Daily before breakfast   Cholecalciferol (VITAMIN D -3 PO) 6,000 Units, Daily   fexofenadine (ALLEGRA) 180 mg, Daily   fluocinonide cream (LIDEX) 0.05 %    fluticasone  (CUTIVATE ) 0.05 % cream 2 times daily   IBUPROFEN  PO As needed   ketoconazole (NIZORAL) 2 % cream 1 application , Daily   meloxicam  (MOBIC ) 15 mg, Oral, Daily   nystatin ointment  (MYCOSTATIN)    telmisartan  (MICARDIS ) 10 mg, Oral, Daily    Patient Active Problem List   Diagnosis Date Noted   Recurrent UTI 10/06/2023   HTN (hypertension) 02/27/2022   Malignant tumor of breast (HCC) 06/20/2020   Arthritis 06/20/2020   Rosacea 08/17/2018   Tear of medial meniscus of knee 06/15/2018   Pain in left knee 12/16/2017   B12 deficiency 03/08/2016   After cataract not obscuring vision 01/19/2013   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     Objective:     BP 102/70   Pulse 75   Temp 98.2 F (36.8 C) (Oral)   Ht 5' 6 (1.676 m)   Wt 217 lb 4.8 oz (98.6 kg)   SpO2 97%   BMI 35.07 kg/m    Physical Exam Vitals reviewed.  Constitutional:      Appearance: Normal appearance. She is well-groomed. She is obese.  Cardiovascular:     Rate and Rhythm: Normal rate and regular rhythm.     Pulses: Normal pulses.     Heart sounds: S1 normal and S2 normal.  Pulmonary:  Effort: Pulmonary effort is normal.     Breath sounds: Normal breath sounds and air entry.  Musculoskeletal:     Right lower leg: No edema.     Left lower leg: No edema.  Neurological:     Mental Status: She is alert and oriented to person, place, and time. Mental status is at baseline.     Gait: Gait is intact.  Psychiatric:        Mood and Affect: Mood and affect normal.        Speech: Speech normal.        Behavior: Behavior normal.        Judgment: Judgment normal.      No results found for any visits on 04/08/24.    The 10-year ASCVD risk score (Arnett DK, et al., 2019) is: 10.2%    Assessment & Plan:   Prediabetes -     Hemoglobin A1c; Future  Primary hypertension -     Telmisartan ; Take 0.5 tablets (10 mg total) by mouth daily.  Dispense: 45 tablet; Refill: 1  Arthritis -     Meloxicam ; Take 1 tablet (15 mg total) by mouth daily.  Dispense: 90 tablet; Refill: 1   Assessment and Plan    Recurrent urinary tract infections No UTIs in the last six months while on Macrobid  50 mg daily. Transitioning to vaginal estrogen cream (Estrace ) to reduce UTI recurrence. Estrogen cream is considered safer than long-term antibiotic use due to local absorption and low systemic exposure. Recent studies have debunked the correlation between low-dose vaginal estrogen and breast cancer risk. - Discontinued Macrobid . - Initiated Estrace  cream, apply a pea-sized amount three times a week. - Monitor for UTI recurrence; if UTIs return, will consider reinitiating Macrobid  or alternative treatment.  History of breast cancer Concerns about estrogen use due to breast cancer history. Recent studies indicate low-dose vaginal estrogen is safe with minimal systemic absorption. Continues annual mammograms for surveillance. - Continue annual mammograms.  Aortic atherosclerosis CT scan shows aortic atherosclerosis with soft plaques, no calcification. Calcium score is zero, indicating no significant coronary artery disease. No need for cholesterol medication at this time. Dietary modifications and exercise recommended to manage cholesterol levels. - Continue dietary modifications to reduce cholesterol intake. - Encouraged regular exercise.  Prediabetes A1c previously recorded at 5.9%. Monitoring for progression to diabetes. No current need for medication as A1c is below 6.5%. - Rechecked A1c today. - Continue monitoring A1c levels.  Primary hypertension Blood pressure is well-controlled on current medication regimen. - Refilled blood pressure medication.  Osteoarthritis Managed with meloxicam  for joint pain. -  Refilled meloxicam .  Benign hepatic cysts Incidental finding of hepatic cysts, likely benign. No symptoms or liver function abnormalities noted. - Continue monitoring liver function tests.  Pulmonary scarring, right middle lobe Mild peripheral and anterior lateral right middle lobe scarring, likely from past radiation therapy or pneumonia. No current respiratory symptoms.  General Health Maintenance Tetanus vaccination is up to date, next due in 2027. Plans to resume exercise with new gym membership. - Encouraged regular exercise. - Will schedule next annual wellness visit in the summer.        Return in about 6 months (around 10/11/2024) for AWV, physical with me in office. SABRA Heron CHRISTELLA Ozell, MD

## 2024-04-09 ENCOUNTER — Ambulatory Visit: Payer: Self-pay | Admitting: Family Medicine

## 2024-04-10 ENCOUNTER — Encounter: Payer: Self-pay | Admitting: Internal Medicine

## 2024-04-28 ENCOUNTER — Ambulatory Visit: Payer: Self-pay

## 2024-04-28 NOTE — Telephone Encounter (Signed)
 FYI Only or Action Required?: Action required by provider: clinical question for provider and update on patient condition.  Patient was last seen in primary care on 04/08/2024 by Ozell Heron HERO, MD.  Called Nurse Triage reporting Dysuria.  Symptoms began yesterday.  Interventions attempted: Rest, hydration, or home remedies.  Symptoms are: gradually worsening.  Triage Disposition: Callback by PCP Today (overriding See Physician Within 24 Hours)  Patient/caregiver understands and will follow disposition?: Yes        Copied from CRM #[id number]. Topic: Clinical - Red Word Triage >> Apr 28, 2024  8:52 AM Rea ORN wrote: Red Word that prompted transfer to Nurse Triage: UTI positive from home test. She is still using the estradiol  vaginal cream. Pt asking if she should take nitrofurantoin  50 mgs that she has at home. Pt stated in the past she has taken 100 mg of that rx and wonders if she should take 2 tablets.  Pt current sx are pain with urination and increased frequency that began yesterday. Pt stated she gets UTI's often. Reason for Disposition  Age > 50 years  Answer Assessment - Initial Assessment Questions 1. SEVERITY: How bad is the pain?  (e.g., Scale 1-10; mild, moderate, or severe)     Extreme burning 8-9/10  2. FREQUENCY: How many times have you had painful urination today?      3-4 times.  3. PATTERN: Is pain present every time you urinate or just sometimes?      Every time.  4. ONSET: When did the painful urination start?      Yesterday.  5. FEVER: Do you have a fever? If Yes, ask: What is your temperature, how was it measured, and when did it start?     No.  6. PAST UTI: Have you had a urine infection before? If Yes, ask: When was the last time? and What happened that time?      Yes. About 6 months ago. She states she was on a 6 month low dos Macrobid  protocol then switched to estradiol  vaginal cream in December.  7. CAUSE: What do you  think is causing the painful urination?  (e.g., UTI, scratch, Herpes sore)     UTI. Home test positive this morning for UTI.  8. OTHER SYMPTOMS: Do you have any other symptoms? (e.g., blood in urine, flank pain, genital sores, urgency, vaginal discharge)     No vomiting, fever, blood in urine, back or flank pain.  Protocols used: Urination Pain - Female-A-AH

## 2024-04-28 NOTE — Telephone Encounter (Signed)
 Yes it would be 100 mg twice a day for 7 days. Yes she should continue taking the estradiol  cream. Usually I would want a visit to check her urine in the office so that we can document the UTI and send it for culture to confirm. If she wants to treat at home that's ok but if her symptoms do not improve then she needs a visit.

## 2024-04-28 NOTE — Telephone Encounter (Signed)
 Patient informed of the message below.

## 2024-06-17 ENCOUNTER — Ambulatory Visit: Admitting: Primary Care

## 2024-10-11 ENCOUNTER — Encounter: Admitting: Family Medicine

## 2025-04-04 ENCOUNTER — Ambulatory Visit: Admitting: Internal Medicine
